# Patient Record
Sex: Female | Born: 1985
Health system: Southern US, Community
[De-identification: ages and names within clinical notes are randomized; demographics above are authoritative.]

## PROBLEM LIST (undated history)

## (undated) DIAGNOSIS — M9901 Segmental and somatic dysfunction of cervical region: Secondary | ICD-10-CM

## (undated) DIAGNOSIS — K219 Gastro-esophageal reflux disease without esophagitis: Secondary | ICD-10-CM

## (undated) DIAGNOSIS — M5136 Other intervertebral disc degeneration, lumbar region: Secondary | ICD-10-CM

## (undated) DIAGNOSIS — D649 Anemia, unspecified: Secondary | ICD-10-CM

## (undated) DIAGNOSIS — E785 Hyperlipidemia, unspecified: Secondary | ICD-10-CM

## (undated) DIAGNOSIS — D75839 Thrombocytosis, unspecified: Secondary | ICD-10-CM

## (undated) DIAGNOSIS — F329 Major depressive disorder, single episode, unspecified: Secondary | ICD-10-CM

## (undated) DIAGNOSIS — F419 Anxiety disorder, unspecified: Secondary | ICD-10-CM

## (undated) DIAGNOSIS — F909 Attention-deficit hyperactivity disorder, unspecified type: Secondary | ICD-10-CM

## (undated) DIAGNOSIS — F32A Depression, unspecified: Secondary | ICD-10-CM

## (undated) DIAGNOSIS — I10 Essential (primary) hypertension: Secondary | ICD-10-CM

## (undated) DIAGNOSIS — R519 Headache, unspecified: Secondary | ICD-10-CM

## (undated) DIAGNOSIS — G4730 Sleep apnea, unspecified: Secondary | ICD-10-CM

## (undated) DIAGNOSIS — M51369 Other intervertebral disc degeneration, lumbar region without mention of lumbar back pain or lower extremity pain: Secondary | ICD-10-CM

## (undated) DIAGNOSIS — E119 Type 2 diabetes mellitus without complications: Secondary | ICD-10-CM

## (undated) DIAGNOSIS — F319 Bipolar disorder, unspecified: Secondary | ICD-10-CM

## (undated) DIAGNOSIS — F603 Borderline personality disorder: Secondary | ICD-10-CM

## (undated) HISTORY — DX: Borderline personality disorder: F60.3

## (undated) HISTORY — DX: Depression, unspecified: F32.A

## (undated) HISTORY — DX: Attention-deficit hyperactivity disorder, unspecified type: F90.9

## (undated) HISTORY — DX: Essential (primary) hypertension: I10

## (undated) HISTORY — DX: Anxiety disorder, unspecified: F41.9

## (undated) HISTORY — PX: WISDOM TOOTH EXTRACTION: SHX21

## (undated) HISTORY — DX: Major depressive disorder, single episode, unspecified: F32.9

## (undated) HISTORY — DX: Bipolar disorder, unspecified: F31.9

---

## 2009-09-11 HISTORY — PX: DILATATION & CURRETTAGE/HYSTEROSCOPY WITH RESECTOCOPE: SHX5572

## 2010-04-18 ENCOUNTER — Ambulatory Visit: Payer: Self-pay

## 2010-04-21 ENCOUNTER — Ambulatory Visit: Payer: Self-pay

## 2014-09-11 DIAGNOSIS — F319 Bipolar disorder, unspecified: Secondary | ICD-10-CM

## 2014-09-11 HISTORY — DX: Bipolar disorder, unspecified: F31.9

## 2015-04-22 ENCOUNTER — Ambulatory Visit: Payer: Self-pay | Admitting: Psychiatry

## 2015-05-04 ENCOUNTER — Telehealth: Payer: Self-pay

## 2015-05-04 NOTE — Telephone Encounter (Signed)
Please refer to her Therapist.

## 2015-05-04 NOTE — Telephone Encounter (Signed)
pt wants a letter to get approved for a therapy dog.  pt states she needs a letter stating that it would help her or be a benfit for her to have a therapy dog.

## 2015-05-06 NOTE — Telephone Encounter (Signed)
pt has appt with nicole friday 05-07-15

## 2015-05-07 ENCOUNTER — Ambulatory Visit (INDEPENDENT_AMBULATORY_CARE_PROVIDER_SITE_OTHER): Payer: Self-pay | Admitting: Licensed Clinical Social Worker

## 2015-05-07 DIAGNOSIS — F3130 Bipolar disorder, current episode depressed, mild or moderate severity, unspecified: Secondary | ICD-10-CM
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
## 2015-09-12 DIAGNOSIS — F603 Borderline personality disorder: Secondary | ICD-10-CM

## 2015-09-12 HISTORY — DX: Borderline personality disorder: F60.3

## 2016-09-27 DIAGNOSIS — F603 Borderline personality disorder: Secondary | ICD-10-CM | POA: Insufficient documentation

## 2018-12-02 ENCOUNTER — Encounter: Payer: Self-pay | Admitting: Family Medicine

## 2018-12-02 ENCOUNTER — Telehealth: Payer: Self-pay | Admitting: Family Medicine

## 2018-12-02 ENCOUNTER — Other Ambulatory Visit: Payer: Self-pay

## 2018-12-02 ENCOUNTER — Ambulatory Visit (INDEPENDENT_AMBULATORY_CARE_PROVIDER_SITE_OTHER): Payer: BLUE CROSS/BLUE SHIELD | Admitting: Family Medicine

## 2018-12-02 VITALS — BP 148/84 | HR 100 | Temp 98.6°F | Resp 16 | Ht 62.0 in | Wt 262.9 lb

## 2018-12-02 DIAGNOSIS — Z13220 Encounter for screening for lipoid disorders: Secondary | ICD-10-CM

## 2018-12-02 DIAGNOSIS — Z833 Family history of diabetes mellitus: Secondary | ICD-10-CM

## 2018-12-02 DIAGNOSIS — Z23 Encounter for immunization: Secondary | ICD-10-CM

## 2018-12-02 DIAGNOSIS — Z114 Encounter for screening for human immunodeficiency virus [HIV]: Secondary | ICD-10-CM

## 2018-12-02 DIAGNOSIS — F319 Bipolar disorder, unspecified: Secondary | ICD-10-CM

## 2018-12-02 DIAGNOSIS — N921 Excessive and frequent menstruation with irregular cycle: Secondary | ICD-10-CM | POA: Diagnosis not present

## 2018-12-02 DIAGNOSIS — N946 Dysmenorrhea, unspecified: Secondary | ICD-10-CM

## 2018-12-02 DIAGNOSIS — Z1159 Encounter for screening for other viral diseases: Secondary | ICD-10-CM

## 2018-12-02 MED ORDER — QUETIAPINE FUMARATE 50 MG PO TABS: ORAL_TABLET | ORAL | 1 refills | Status: DC

## 2018-12-02 NOTE — Telephone Encounter (Signed)
Urgent referral to psychiatry for Bipolar Type I. Thanks!

## 2018-12-02 NOTE — Progress Notes (Signed)
Name: Amanda Nash   MRN: [id number]    DOB: [birth year]   Date:12/02/2018       Progress Note  Subjective  Chief Complaint  Chief Complaint  Patient presents with  . Establish Care  . Referral    GYN and Psychiatry    HPI  Bipolar I Disorder:  Has not had treatment in over 4 years. Was diagnosed at ago [age]yo.  As a teen she was on depakote 500mg /lamictal 25mg .  As a young adult she restarted medication, but can't remember what she was put on. Most recently she was on a medication that made her very drowsy and did not like the way it made her feel.  Over the last 4 years she has had many manic episodes. In a 6 month span she had 3-4 manic episodes that last for a few days.  Manic episodes include - restlessness, pressured/rapid speech, irritability/labile mood, hypersexuality.  She has never been hospitalized, has never lost a job due to her mental illness, and has never had a suicide attempt. Used to self harm, has not done so since 2012, though she admits to urges to do so.  No SI/HI today, no thoughts of self harm today.  In 2009 had an abortion due to non-viable fetus. After this she had a lot of uncontrolled/irregular vaginal bleeding.  Was seeing a provider at [facility] at the time, but has not been back since [facility].  She still has heavy and painful periods, large clots with menses, and dyspareunia.  She would like to see GYN to discuss partial hysterectomy as she is not seeking children and wants her bleeding and pain to stop.  Discussed likely course of testing prior to reaching conclusion of need for hysterectomy, she verbalizes understanding and we will refer today.  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).  She works from home. Tries to eat low sodium and low carb meals. She notes she is down 10lbs since 2016.  No family or personal history of pancreatitis or thyroid cancer.   There are no active problems to display for this patient.   Past Surgical History:  Procedure Laterality Date  . DILATATION & CURRETTAGE/HYSTEROSCOPY WITH RESECTOCOPE  2011    Family History  Problem Relation Age of Onset  . Depression Mother   . Anxiety disorder Mother   . Heart disease Mother   . Bipolar disorder Father   . Hypertension Father   . Diabetes Father   . Depression Brother   . Anxiety disorder Brother     Social History   Socioeconomic History  . Marital status: Married    Spouse name: Shela Nevin  . Number of children: Not on file  . Years of education: Not on file  . Highest education level: Not on file  Occupational History  . Occupation: Systems developer    Comment: [place]  . Financial resource strain: Not hard at all  . Food insecurity:    Worry: Never true    Inability: Never true  . Transportation needs:    Medical: No    Non-medical: No  Tobacco Use  . Smoking status: Former Smoker    Last attempt to quit: 01/31/2005    Years since quitting: 13.8  . Smokeless tobacco: Never Used  Substance and Sexual Activity  . Alcohol use: Yes    Comment: rarely  . Drug use: Yes    Types: Marijuana  . Sexual activity: Yes  Partners: Male    Birth control/protection: None  Lifestyle  . Physical activity:    Days per week: 0 days    Minutes per session: 0 min  . Stress: Rather much  Relationships  . Social connections:    Talks on phone: More than three times a week    Gets together: More than three times a week    Attends religious service: More than 4 times per year    Active member of club or organization: No    Attends meetings of clubs or organizations: Never    Relationship status: Married  . Intimate partner violence:    Fear of current or ex partner: No    Emotionally abused: No    Physically abused: No    Forced sexual activity: No  Other Topics Concern  . Not on file  Social History Narrative  . Not on file    No current outpatient medications on file.  No Known  Allergies  I personally reviewed active problem list, medication list, allergies, family history, social history, health maintenance, notes from last encounter, lab results with the patient/caregiver today.   ROS  Constitutional: Negative for fever or weight change.  Respiratory: Negative for cough and shortness of breath.   Cardiovascular: Negative for chest pain or palpitations.  Gastrointestinal: Negative for abdominal pain, no bowel changes.  Musculoskeletal: Negative for gait problem or joint swelling.  Skin: Negative for rash.  Neurological: Negative for dizziness or headache.  No other specific complaints in a complete review of systems (except as listed in HPI above).  Objective  Vitals:   12/02/18 1255  BP: (!) 148/84  Pulse: 100  Resp: 16  Temp: 98.6 F (37 C)  TempSrc: Oral  SpO2: 97%  Weight: 262 lb 14.4 oz (119.3 kg)  Height: 5\' 2"  (1.575 m)   Body mass index is 48.09 kg/m.  Physical Exam  Constitutional: Patient appears well-developed and well-nourished. No distress.  HENT: Head: Normocephalic and atraumatic. Ears: bilateral TMs with no erythema or effusion; Nose: Nose normal. Mouth/Throat: Oropharynx is clear and moist. No oropharyngeal exudate or tonsillar swelling.  Eyes: Conjunctivae and EOM are normal. No scleral icterus.  Pupils are equal, round, and reactive to light.  Neck: Normal range of motion. Neck supple. No JVD present. No thyromegaly present.  Cardiovascular: Normal rate, regular rhythm and normal heart sounds.  No murmur heard. No BLE edema. Pulmonary/Chest: Effort normal and breath sounds normal. No respiratory distress. Abdominal: Soft. Bowel sounds are normal, no distension. There is no tenderness. No masses. Musculoskeletal: Normal range of motion, no joint effusions. No gross deformities Neurological: Pt is alert and oriented to person, place, and time. No cranial nerve deficit. Coordination, balance, strength, speech and gait are normal.   Skin: Skin is warm and dry. No rash noted. No erythema.  Psychiatric: Patient has a normal mood and affect. behavior is normal. Judgment and thought content normal.  No results found for this or any previous visit (from the past 72 hour(s)).  PHQ2/9: Depression screen PHQ 2/9 12/02/2018  Decreased Interest 2  Down, Depressed, Hopeless 3  PHQ - 2 Score 5  Altered sleeping 3  Tired, decreased energy 3  Change in appetite 1  Feeling bad or failure about yourself  3  Trouble concentrating 1  Moving slowly or fidgety/restless 2  Suicidal thoughts 2  PHQ-9 Score 20  Difficult doing work/chores Extremely dIfficult   PHQ-2/9 Result is positive - referral to psychiatry placed.    Fall  Risk: Fall Risk  12/02/2018  Falls in the past year? 0  Number falls in past yr: 0  Injury with Fall? 0  Follow up Falls evaluation completed   Assessment & Plan  1. Bipolar I disorder [facility]) - Ambulatory referral to Psychiatry - Spoke with Dr. Nicolasa Ducking with psychiatry, she advises to start Seroquel while patient awaits new patient visit. Patient is aware of new start of Seroquel and will follow up with pharmacy to pick up meds.  2. Dysmenorrhea - Ambulatory referral to Gynecology  3. Menorrhagia with irregular cycle - Ambulatory referral to Gynecology - CBC w/Diff/Platelet - COMPLETE METABOLIC PANEL WITH GFR - TSH  4. Need for Tdap vaccination - Tdap vaccine greater than or equal to 7yo IM  5. Need for hepatitis C screening test - Hepatitis C antibody  6. Encounter for screening for HIV - HIV Antibody (routine testing w rflx)  7. Family history of diabetes mellitus - COMPLETE METABOLIC PANEL WITH GFR - Hemoglobin A1c  8. Lipid screening - Lipid panel  9. Morbid obesity ([place]) - CBC w/Diff/Platelet - COMPLETE METABOLIC PANEL WITH GFR - Hemoglobin A1c - Lipid panel - TSH  - Discussed importance of 150 minutes of physical activity weekly, eat two servings of fish weekly, eat one serving  of tree nuts ( cashews, pistachios, pecans, almonds.Marland Kitchen) every other day, eat 6 servings of fruit/vegetables daily and drink plenty of water and avoid sweet beverages.

## 2018-12-02 NOTE — Patient Instructions (Signed)
Here are some resources to help you if you feel you are in a mental health crisis:  National Suicide Prevention Lifeline - Call 1-800-273-8255  for help - Website with more resources: https://suicidepreventionlifeline.org/  [facility] Mobile Crisis Program - Call 336-538-1220 for help. - Mobile Crisis Program available 24 hours a day, 365 days a year. - Available for anyone of any age in [place] counties.  [facility] - Address: [address], [place] - Telephone: [phone]  - Hours of Operation: Sunday - Saturday - 8:00 a.m. - 8:00 p.m. - Medicaid, Medicare (Government Issued Only), BCBS, and Cash - Pay - Crisis Management, Outpatient Individual & Group Therapy, Psychiatrists on-site to provide medication management, In-Home Psychiatric Care, and Peer Support Care.  [facility] - Call 1-877-626-1772 for help. - Mobile Crisis Program available 24 hours a day, 365 days a year. - Available for anyone of any age in [place]    

## 2018-12-03 ENCOUNTER — Other Ambulatory Visit: Payer: Self-pay | Admitting: Family Medicine

## 2018-12-03 DIAGNOSIS — R7303 Prediabetes: Secondary | ICD-10-CM

## 2018-12-03 LAB — COMPLETE METABOLIC PANEL WITH GFR
AG Ratio: 1.4 (calc) (ref 1.0–2.5)
ALT: 24 U/L (ref 6–29)
AST: 14 U/L (ref 10–30)
Albumin: 4.1 g/dL (ref 3.6–5.1)
Alkaline phosphatase (APISO): 72 U/L (ref 31–125)
BUN: 13 mg/dL (ref 7–25)
CO2: 28 mmol/L (ref 20–32)
CREATININE: 0.79 mg/dL (ref 0.50–1.10)
Calcium: 9.5 mg/dL (ref 8.6–10.2)
Chloride: 103 mmol/L (ref 98–110)
GFR, EST NON AFRICAN AMERICAN: 98 mL/min/{1.73_m2} (ref >=60)
GFR, Est African American: 114 mL/min/{1.73_m2} (ref >=60)
GLOBULIN: 2.9 g/dL (ref 1.9–3.7)
Glucose, Bld: 109 mg/dL — ABNORMAL HIGH (ref 65–99)
Potassium: 4.5 mmol/L (ref 3.5–5.3)
Sodium: 138 mmol/L (ref 135–146)
Total Bilirubin: 0.2 mg/dL (ref 0.2–1.2)
Total Protein: 7 g/dL (ref 6.1–8.1)

## 2018-12-03 LAB — CBC WITH DIFFERENTIAL/PLATELET
ABSOLUTE MONOCYTES: 616 {cells}/uL (ref 200–950)
Basophils Absolute: 49 cells/uL (ref 0–200)
Basophils Relative: 0.7 %
EOS PCT: 1.1 %
Eosinophils Absolute: 77 cells/uL (ref 15–500)
HCT: 34.5 % — ABNORMAL LOW (ref 35.0–45.0)
HEMOGLOBIN: 10.5 g/dL — AB (ref 11.7–15.5)
Lymphs Abs: 2282 cells/uL (ref 850–3900)
MCH: 20.6 pg — ABNORMAL LOW (ref 27.0–33.0)
MCHC: 30.4 g/dL — ABNORMAL LOW (ref 32.0–36.0)
MCV: 67.6 fL — ABNORMAL LOW (ref 80.0–100.0)
MPV: 9.6 fL (ref 7.5–12.5)
Monocytes Relative: 8.8 %
Neutro Abs: 3976 cells/uL (ref 1500–7800)
Neutrophils Relative %: 56.8 %
Platelets: 454 10*3/uL — ABNORMAL HIGH (ref 140–400)
RBC: 5.1 10*6/uL (ref 3.80–5.10)
RDW: 16.9 % — ABNORMAL HIGH (ref 11.0–15.0)
TOTAL LYMPHOCYTE: 32.6 %
WBC: 7 10*3/uL (ref 3.8–10.8)

## 2018-12-03 LAB — HEPATITIS C ANTIBODY
Hepatitis C Ab: NONREACTIVE
SIGNAL TO CUT-OFF: 0.01 (ref <1.00)

## 2018-12-03 LAB — HEMOGLOBIN A1C
Hgb A1c MFr Bld: 6.1 % of total Hgb — ABNORMAL HIGH (ref <5.7)
MEAN PLASMA GLUCOSE: 128 (calc)
eAG (mmol/L): 7.1 (calc)

## 2018-12-03 LAB — HIV ANTIBODY (ROUTINE TESTING W REFLEX): HIV 1&2 Ab, 4th Generation: NONREACTIVE

## 2018-12-03 LAB — LIPID PANEL
Cholesterol: 152 mg/dL (ref <200)
HDL: 51 mg/dL (ref >=50)
LDL CHOLESTEROL (CALC): 66 mg/dL
NON-HDL CHOLESTEROL (CALC): 101 mg/dL (ref <130)
TRIGLYCERIDES: 269 mg/dL — AB (ref <150)
Total CHOL/HDL Ratio: 3 (calc) (ref <5.0)

## 2018-12-03 LAB — TSH: TSH: 0.72 mIU/L

## 2018-12-03 MED ORDER — INSULIN PEN NEEDLE 32G X 6 MM MISC: 1.0000 | 0 refills | Status: DC

## 2018-12-03 MED ORDER — SEMAGLUTIDE(0.25 OR 0.5MG/DOS) 2 MG/1.5ML ~~LOC~~ SOPN: PEN_INJECTOR | SUBCUTANEOUS | 1 refills | Status: AC

## 2018-12-16 ENCOUNTER — Encounter: Payer: Self-pay | Admitting: Obstetrics and Gynecology

## 2018-12-17 ENCOUNTER — Encounter: Payer: Self-pay | Admitting: Obstetrics and Gynecology

## 2018-12-17 ENCOUNTER — Other Ambulatory Visit: Payer: Self-pay

## 2018-12-17 ENCOUNTER — Ambulatory Visit (INDEPENDENT_AMBULATORY_CARE_PROVIDER_SITE_OTHER): Payer: BLUE CROSS/BLUE SHIELD | Admitting: Obstetrics and Gynecology

## 2018-12-17 ENCOUNTER — Other Ambulatory Visit (HOSPITAL_COMMUNITY)
Admission: RE | Admit: 2018-12-17 | Discharge: 2018-12-17 | Disposition: A | Discharge status: Home / Self Care | Payer: BLUE CROSS/BLUE SHIELD | Source: Ambulatory Visit | Attending: Obstetrics and Gynecology | Admitting: Obstetrics and Gynecology

## 2018-12-17 VITALS — BP 130/90 | Ht 62.0 in | Wt 266.0 lb

## 2018-12-17 DIAGNOSIS — N921 Excessive and frequent menstruation with irregular cycle: Secondary | ICD-10-CM | POA: Insufficient documentation

## 2018-12-17 DIAGNOSIS — N946 Dysmenorrhea, unspecified: Secondary | ICD-10-CM | POA: Diagnosis not present

## 2018-12-17 NOTE — Progress Notes (Signed)
Obstetrics & Gynecology Office Visit   Chief Complaint  Patient presents with  . Menorrhagia  . Dysmenorrhea  Referral of Raelyn Ensign, FNP, from [facility] for menorrhagia and dysmenorrhea   History of Present Illness: 33 y.o. G1P0010 who presents in referral of Raelyn Ensign, [place], from [facility] for menorrhagia and dysmenorrhea.  She notes very heavy, irregular menses.  She is sure she has cysts on her ovaries.  She has considered a hysterectomy.  But mostly she wants the issues to stop.  She notes heavy periods that have been present always.  They last about 7 days.  Her record is 4 months straight where she had a period nearly every day. Her periods last 7-10 days. She passes clots that are sometimes "as big as clementines."   She feels extreme pain once a month where she feels this one side, then two or three days later it is on the other side. This is sometimes accompanied by a light period that may be spotting that lasts a couple of days.  She was on the combined OCP when she got pregnant in 2009.   The D&C was not accomplished until 1-2 years after the miscarriage. This was due to her periods being irregular. She does not remember the last time she had a pap smear. She does not believe she has ever had an abnormal one.  She has a history of STDs in 2014, she had chlamydia that was treated.  Apart from the pills she has been on no medication to regular her menses.   Past Medical History:  Diagnosis Date  . Anxiety   . Bipolar 1 disorder ([place]) 2016  . Borderline personality disorder ([place]) 2017  . Depression     Past Surgical History:  Procedure Laterality Date  . DILATATION & CURRETTAGE/HYSTEROSCOPY WITH RESECTOCOPE  2011    Gynecologic History: Patient's last menstrual period was 11/19/2018.  Obstetric History: G1P0010  Family History  Problem Relation Age of Onset  . Depression Mother   . Anxiety disorder Mother   . Heart disease Mother   .  Bipolar disorder Father   . Hypertension Father   . Diabetes Father   . Depression Brother   . Anxiety disorder Brother     Social History   Socioeconomic History  . Marital status: Married    Spouse name: Shela Nevin  . Number of children: Not on file  . Years of education: Not on file  . Highest education level: Not on file  Occupational History  . Occupation: Systems developer    Comment: [place]  . Financial resource strain: Not hard at all  . Food insecurity:    Worry: Never true    Inability: Never true  . Transportation needs:    Medical: No    Non-medical: No  Tobacco Use  . Smoking status: Former Smoker    Last attempt to quit: 01/31/2005    Years since quitting: 13.8  . Smokeless tobacco: Never Used  Substance and Sexual Activity  . Alcohol use: Yes    Comment: rarely  . Drug use: Yes    Types: Marijuana  . Sexual activity: Yes    Partners: Male    Birth control/protection: None  Lifestyle  . Physical activity:    Days per week: 0 days    Minutes per session: 0 min  . Stress: Rather much  Relationships  . Social connections:    Talks on phone: More than three times a  week    Gets together: More than three times a week    Attends religious service: More than 4 times per year    Active member of club or organization: No    Attends meetings of clubs or organizations: Never    Relationship status: Married  . Intimate partner violence:    Fear of current or ex partner: No    Emotionally abused: No    Physically abused: No    Forced sexual activity: No  Other Topics Concern  . Not on file  Social History Narrative   Live with husband and her brother, has 2 dogs and a bunny at home. Has stepdaughter that does not live with them.   Allergies: No Known Allergies  Prior to Admission medications   Medication Sig Start Date End Date Taking? Authorizing Provider  QUEtiapine (SEROQUEL) 50 MG tablet Take 1 tablet every night x7 days, then  increase to 2 tablets every night. 12/02/18  Yes Hubbard Hartshorn, FNP  Insulin Pen Needle (NOVOFINE) 32G X 6 MM MISC 1 each by Does not apply route once a week. Patient not taking: Reported on 12/17/2018 12/03/18   Hubbard Hartshorn, FNP  Semaglutide,0.25 or 0.5MG /DOS, (OZEMPIC, 0.25 OR 0.5 MG/DOSE,) 2 MG/1.5ML SOPN Inject 0.25 mg into the skin once a week for 28 days, THEN 0.5 mg once a week. Patient not taking: Reported on 12/17/2018 12/03/18 03/31/19  Hubbard Hartshorn, FNP    Review of Systems  Constitutional: Negative.   HENT: Negative.   Eyes: Negative.   Respiratory: Negative.   Cardiovascular: Negative.   Gastrointestinal: Negative.   Genitourinary: Negative.   Musculoskeletal: Negative.   Skin: Negative.   Neurological: Negative.   Psychiatric/Behavioral: Negative.      Physical Exam BP 130/90   Ht 5\' 2"  (1.575 m)   Wt 266 lb (120.7 kg)   LMP 11/19/2018   BMI 48.65 kg/m  Patient's last menstrual period was 11/19/2018. Physical Exam Constitutional:      General: She is not in acute distress.    Appearance: Normal appearance. She is well-developed.  Genitourinary:     Pelvic exam was performed with patient in the lithotomy position.     Vulva, inguinal canal, urethra, bladder, vagina, uterus, right adnexa and left adnexa normal.     No posterior fourchette tenderness, injury or lesion present.     No cervical friability, lesion, bleeding or polyp.     Genitourinary Comments: Exam limited by body habitus  HENT:     Head: Normocephalic and atraumatic.  Eyes:     General: No scleral icterus.    Conjunctiva/sclera: Conjunctivae normal.  Neck:     Musculoskeletal: Normal range of motion and neck supple.  Cardiovascular:     Rate and Rhythm: Normal rate and regular rhythm.     Heart sounds: No murmur. No friction rub. No gallop.   Pulmonary:     Effort: Pulmonary effort is normal. No respiratory distress.     Breath sounds: Normal breath sounds. No wheezing or rales.  Abdominal:      General: Bowel sounds are normal. There is no distension.     Palpations: Abdomen is soft. There is no mass.     Tenderness: There is no abdominal tenderness. There is no guarding or rebound.  Musculoskeletal: Normal range of motion.  Neurological:     General: No focal deficit present.     Mental Status: She is alert and oriented to person, place, and time.  Cranial Nerves: No cranial nerve deficit.  Skin:    General: Skin is warm and dry.     Findings: No erythema.  Psychiatric:        Mood and Affect: Mood normal.        Behavior: Behavior normal.        Judgment: Judgment normal.     Female chaperone present for pelvic and breast  portions of the physical exam  Assessment: 33 y.o. No obstetric history on file. female here for  1. Menorrhagia with irregular cycle   2. Dysmenorrhea      Plan: Problem List Items Addressed This Visit      Genitourinary   Dysmenorrhea   Relevant Orders   Cytology - PAP   US PELVIS TRANSVANGINAL NON-OB (TV ONLY)     Other   Menorrhagia with irregular cycle - Primary   Relevant Orders   Cytology - PAP   US PELVIS TRANSVANGINAL NON-OB (TV ONLY)     Discussed management options for abnormal uterine bleeding including expectant, NSAIDs, tranexamic acid (Lysteda), oral progesterone (Provera, norethindrone, megace), Depo Provera, Levonorgestrel containing IUD, endometrial ablation (Novasure) or hysterectomy as definitive surgical management.  Discussed risks and benefits of each method.   Final management decision will hinge on results of patient's work up and whether an underlying etiology for the patients bleeding symptoms can be discerned.  We will conduct a basic work up examining using the PALM-COIEN classification system.  In the meantime the patient opts to trial no medication while we await results of her ultrasound and labs.  Printed patient education handouts were given to the patient to review at home.  Bleeding precautions  reviewed.   30 minutes spent in face to face discussion with > 50% spent in counseling,management, and coordination of care of her menorrhagia with irregular cycle, dysmenorrhea.   Prentice Docker, MD 12/17/2018 3:22 PM     CC:  Hubbard Hartshorn, [address] [place], [place] [id number]

## 2018-12-20 ENCOUNTER — Encounter: Payer: Self-pay | Admitting: Family Medicine

## 2018-12-20 ENCOUNTER — Telehealth: Payer: Self-pay | Admitting: Family Medicine

## 2018-12-20 NOTE — Telephone Encounter (Signed)
Copied from [place] [phone]. Topic: Quick Communication - See Telephone Encounter >> Dec 20, 2018  3:02 PM Blase Mess A wrote: CRM for notification. See Telephone encounter for: 12/20/18.  Amanda Nash calling from [facility] calling to report that theSemaglutide,0.25 or 0.5MG /DOS, (OZEMPIC, 0.25 OR 0.5 MG/DOSE,) 2 MG/1.5ML SOPN [[id number]] was denied for the reason that no information was provided if metformin, hb 1c was provided. Please advise CB- [phone] Reference [id number]

## 2018-12-24 ENCOUNTER — Other Ambulatory Visit: Payer: Self-pay

## 2018-12-24 ENCOUNTER — Ambulatory Visit (INDEPENDENT_AMBULATORY_CARE_PROVIDER_SITE_OTHER): Payer: Self-pay | Admitting: Family Medicine

## 2018-12-24 ENCOUNTER — Encounter: Payer: Self-pay | Admitting: Family Medicine

## 2018-12-24 DIAGNOSIS — R7303 Prediabetes: Secondary | ICD-10-CM | POA: Insufficient documentation

## 2018-12-24 DIAGNOSIS — F319 Bipolar disorder, unspecified: Secondary | ICD-10-CM

## 2018-12-24 DIAGNOSIS — N921 Excessive and frequent menstruation with irregular cycle: Secondary | ICD-10-CM

## 2018-12-24 DIAGNOSIS — D649 Anemia, unspecified: Secondary | ICD-10-CM | POA: Insufficient documentation

## 2018-12-24 DIAGNOSIS — D75839 Thrombocytosis, unspecified: Secondary | ICD-10-CM | POA: Insufficient documentation

## 2018-12-24 DIAGNOSIS — D473 Essential (hemorrhagic) thrombocythemia: Secondary | ICD-10-CM

## 2018-12-24 LAB — CYTOLOGY - PAP
Chlamydia: NEGATIVE
Diagnosis: NEGATIVE
HPV: NOT DETECTED
Neisseria Gonorrhea: NEGATIVE

## 2018-12-24 MED ORDER — QUETIAPINE FUMARATE 100 MG PO TABS: 100.0000 mg | ORAL_TABLET | Freq: Every day | ORAL | 0 refills | Status: DC

## 2018-12-24 NOTE — Progress Notes (Signed)
Name: Amanda Nash   MRN: [id number]    DOB: May 11, 1986   Date:12/24/2018       Progress Note  Subjective  Chief Complaint  Chief Complaint  Patient presents with   Follow-up    I connected with  [name]  on 12/24/18 at  9:20 AM EDT by a video enabled telemedicine application and verified that I am speaking with the correct person using two identifiers.  I discussed the limitations of evaluation and management by telemedicine and the availability of in person appointments. The patient expressed understanding and agreed to proceed. Staff also discussed with the patient that there may be a patient responsible charge related to this service. Patient Location: Home Provider Location: Home Additional Individuals present: None  HPI  Anemia/thrombocytosis: Seeing Dr. Glennon Mac - had first appt 12/17/2018 and has transvaginal US ordered for tomorrow.  She has menorrhagia, and did discuss several options for control of bleeding, and they are proceeding with a routine work-up first.  She is considering hysterectomy as an option.  She denies fatigue, but does endorse some easier bruising. Denies blood in stool, dark and tarry stools, blood in urine, or epistaxis; occasional gum bleeding.  Bipolar I Disorder:  Had not had treatment in over 4 years. Was diagnosed at ago [age]yo.  As a teen she was on depakote 500mg /lamictal 25mg .  As a young adult she restarted medication, but can't remember what she was put on. Most recently she was on a medication that made her very drowsy and did not like the way it made her feel.  Over the last 4 years she has had many manic episodes. In a 6 month span she had 3-4 manic episodes that last for a few days.  Manic episodes include - restlessness, pressured/rapid speech, irritability/labile mood, hypersexuality.  She has never been hospitalized, has never lost a job due to her mental illness, and has never had a suicide attempt. Used to self harm, has not done so  since 2012, though she admits to urges to do so.  No SI/HI today, no thoughts of self harm today.  She is taking seroquel 100mg  (rx'd at her initial visit) and is feeling good at this dose for her rest, but her depression symptoms are still quite elevated.  She has an appointment to do her paperwork tomorrow, and then will do a virtual visit later on.  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).  She works from home. Tries to eat low sodium and low carb meals. She notes she is down 10lbs since 2016.  No family or personal history of pancreatitis or thyroid cancer. She is not walking more than to take the dogs outside.  Prediabetes: Waiting on Ozempic PA; denies polydipsia, polyuria, or polyphagia.  Her husband is diabetic, and she is already using splenda, avoiding carbs as much as possible.    Patient Active Problem List   Diagnosis Date Noted   Bipolar I disorder ([place]) 12/02/2018   Dysmenorrhea 12/02/2018   Menorrhagia with irregular cycle 12/02/2018   Morbid obesity ([place]) 12/02/2018    Past Surgical History:  Procedure Laterality Date   DILATATION & CURRETTAGE/HYSTEROSCOPY WITH RESECTOCOPE  2011    Family History  Problem Relation Age of Onset   Depression Mother    Anxiety disorder Mother    Heart disease Mother    Bipolar disorder Father    Hypertension Father    Diabetes Father    Depression Brother  Anxiety disorder Brother     Social History   Socioeconomic History   Marital status: Married    Spouse name: Brian0   Number of children: Not on file   Years of education: Not on file   Highest education level: Not on file  Occupational History   Occupation: Systems developer    Comment: [employer]  Social Needs   Financial resource strain: Not hard at all   Food insecurity:    Worry: Never true    Inability: Never true   Transportation needs:    Medical: No    Non-medical: No  Tobacco Use   Smoking  status: Former Smoker    Last attempt to quit: 01/31/2005    Years since quitting: 13.9   Smokeless tobacco: Never Used  Substance and Sexual Activity   Alcohol use: Yes    Comment: rarely   Drug use: Yes    Types: Marijuana   Sexual activity: Yes    Partners: Male    Birth control/protection: None  Lifestyle   Physical activity:    Days per week: 0 days    Minutes per session: 0 min   Stress: Rather much  Relationships   Social connections:    Talks on phone: More than three times a week    Gets together: More than three times a week    Attends religious service: More than 4 times per year    Active member of club or organization: No    Attends meetings of clubs or organizations: Never    Relationship status: Married   Intimate partner violence:    Fear of current or ex partner: No    Emotionally abused: No    Physically abused: No    Forced sexual activity: No  Other Topics Concern   Not on file  Social History Narrative   Live with husband and her brother, has 2 dogs and a bunny at home. Has stepdaughter that does not live with them.     Current Outpatient Medications:    QUEtiapine (SEROQUEL) 50 MG tablet, Take 1 tablet every night x7 days, then increase to 2 tablets every night., Disp: 60 tablet, Rfl: 1   Insulin Pen Needle (NOVOFINE) 32G X 6 MM MISC, 1 each by Does not apply route once a week. (Patient not taking: Reported on 12/17/2018), Disp: 100 each, Rfl: 0   Semaglutide,0.25 or 0.5MG /DOS, (OZEMPIC, 0.25 OR 0.5 MG/DOSE,) 2 MG/1.5ML SOPN, Inject 0.25 mg into the skin once a week for 28 days, THEN 0.5 mg once a week. (Patient not taking: Reported on 12/17/2018), Disp: 3 pen, Rfl: 1  Allergies  Allergen Reactions   Shellfish Allergy Hives, Itching, Other (See Comments), Rash and Swelling    I personally reviewed active problem list, medication list, allergies, health maintenance, notes from last encounter, lab results with the patient/caregiver  today.   ROS Constitutional: Negative for fever or weight change.  Respiratory: Negative for cough and shortness of breath.   Cardiovascular: Negative for chest pain or palpitations.  Gastrointestinal: Negative for abdominal pain, no bowel changes.  Musculoskeletal: Negative for gait problem or joint swelling.  Skin: Negative for rash.  Neurological: Negative for dizziness or headache.  No other specific complaints in a complete review of systems (except as listed in HPI above).  Objective  Virtual encounter, vitals not obtained.  There is no height or weight on file to calculate BMI.  Physical Exam  Constitutional: Patient appears well-developed and well-nourished. No distress.  HENT: Head:  Normocephalic and atraumatic.  Neck: Normal range of motion. Pulmonary/Chest: Effort normal. No respiratory distress. Speaking in complete sentences Neurological: Pt is alert and oriented to person, place, and time. Coordination, speech are normal.  Psychiatric: Patient has a normal mood and affect. behavior is normal. Judgment and thought content normal.  No results found for this or any previous visit (from the past 72 hour(s)).  PHQ2/9: Depression screen [facility] 2/9 12/24/2018 12/02/2018  Decreased Interest 2 2  Down, Depressed, Hopeless 3 3  PHQ - 2 Score 5 5  Altered sleeping 3 3  Tired, decreased energy 3 3  Change in appetite 0 1  Feeling bad or failure about yourself  3 3  Trouble concentrating 0 1  Moving slowly or fidgety/restless 2 2  Suicidal thoughts 1 2  PHQ-9 Score 17 20  Difficult doing work/chores Very difficult Extremely dIfficult   PHQ-2/9 Result is positive.    Fall Risk: Fall Risk  12/24/2018 12/02/2018  Falls in the past year? 0 0  Number falls in past yr: 0 0  Injury with Fall? 0 0  Follow up Falls evaluation completed Falls evaluation completed    Assessment & Plan  1. Bipolar I disorder ([place]) - Starting with Dr. Nicolasa Ducking tomorrow, will provide short refil of  seroquel to bridge her until her telehealth visit. - QUEtiapine (SEROQUEL) 100 MG tablet; Take 1 tablet (100 mg total) by mouth at bedtime.  Dispense: 30 tablet; Refill: 0  2. Prediabetes - Awaiting Ozempic PA from insurance.  Discussed diabetic diet in detail.  3. Menorrhagia with irregular cycle - Has pelvic US tomorrow with Dr. Glennon Mac - CBC w/Diff/Platelet - Iron, TIBC and Ferritin Panel  4. Thrombocytosis ([place]) - Suspect related to menorrhagia, will have her come in tomorrow for labs to trend, otherwise, will allow Dr. Glennon Mac to perform work up from [place] prior to sending to hematology for evaluation. - CBC w/Diff/Platelet  5. Anemia, unspecified type - Suspect related to menorrhagia, will have her come in tomorrow for labs to trend, otherwise, will allow Dr. Glennon Mac to perform work up from [place] prior to sending to hematology for evaluation. - Iron, TIBC and Ferritin Panel  6. Morbid obesity ([place]) Discussed importance of 150 minutes of physical activity weekly, eat two servings of fish weekly, eat one serving of tree nuts ( cashews, pistachios, pecans, almonds.Marland Kitchen) every other day, eat 6 servings of fruit/vegetables daily and drink plenty of water and avoid sweet beverages.    I discussed the assessment and treatment plan with the patient. The patient was provided an opportunity to ask questions and all were answered. The patient agreed with the plan and demonstrated an understanding of the instructions.  The patient was advised to call back or seek an in-person evaluation if the symptoms worsen or if the condition fails to improve as anticipated.  I provided 26 minutes of non-face-to-face time during this encounter.

## 2018-12-24 NOTE — Patient Instructions (Signed)
Diabetes Mellitus and Nutrition, Adult  When you have diabetes (diabetes mellitus), it is very important to have healthy eating habits because your blood sugar (glucose) levels are greatly affected by what you eat and drink. Eating healthy foods in the appropriate amounts, at about the same times every day, can help you:  · Control your blood glucose.  · Lower your risk of heart disease.  · Improve your blood pressure.  · Reach or maintain a healthy weight.  Every person with diabetes is different, and each person has different needs for a meal plan. Your health care provider may recommend that you work with a diet and nutrition specialist (dietitian) to make a meal plan that is best for you. Your meal plan may vary depending on factors such as:  · The calories you need.  · The medicines you take.  · Your weight.  · Your blood glucose, blood pressure, and cholesterol levels.  · Your activity level.  · Other health conditions you have, such as heart or kidney disease.  How do carbohydrates affect me?  Carbohydrates, also called carbs, affect your blood glucose level more than any other type of food. Eating carbs naturally raises the amount of glucose in your blood. Carb counting is a method for keeping track of how many carbs you eat. Counting carbs is important to keep your blood glucose at a healthy level, especially if you use insulin or take certain oral diabetes medicines.  It is important to know how many carbs you can safely have in each meal. This is different for every person. Your dietitian can help you calculate how many carbs you should have at each meal and for each snack.  Foods that contain carbs include:  · Bread, cereal, rice, pasta, and crackers.  · Potatoes and corn.  · Peas, beans, and lentils.  · Milk and yogurt.  · Fruit and juice.  · Desserts, such as cakes, cookies, ice cream, and candy.  How does alcohol affect me?  Alcohol can cause a sudden decrease in blood glucose (hypoglycemia),  especially if you use insulin or take certain oral diabetes medicines. Hypoglycemia can be a life-threatening condition. Symptoms of hypoglycemia (sleepiness, dizziness, and confusion) are similar to symptoms of having too much alcohol.  If your health care provider says that alcohol is safe for you, follow these guidelines:  · Limit alcohol intake to no more than 1 drink per day for nonpregnant women and 2 drinks per day for men. One drink equals 12 oz of beer, 5 oz of wine, or 1½ oz of hard liquor.  · Do not drink on an empty stomach.  · Keep yourself hydrated with water, diet soda, or unsweetened iced tea.  · Keep in mind that regular soda, juice, and other mixers may contain a lot of sugar and must be counted as carbs.  What are tips for following this plan?    Reading food labels  · Start by checking the serving size on the "Nutrition Facts" label of packaged foods and drinks. The amount of calories, carbs, fats, and other nutrients listed on the label is based on one serving of the item. Many items contain more than one serving per package.  · Check the total grams (g) of carbs in one serving. You can calculate the number of servings of carbs in one serving by dividing the total carbs by 15. For example, if a food has 30 g of total carbs, it would be equal to 2   servings of carbs.  · Check the number of grams (g) of saturated and trans fats in one serving. Choose foods that have low or no amount of these fats.  · Check the number of milligrams (mg) of salt (sodium) in one serving. Most people should limit total sodium intake to less than 2,300 mg per day.  · Always check the nutrition information of foods labeled as "low-fat" or "nonfat". These foods may be higher in added sugar or refined carbs and should be avoided.  · Talk to your dietitian to identify your daily goals for nutrients listed on the label.  Shopping  · Avoid buying canned, premade, or processed foods. These foods tend to be high in fat, sodium,  and added sugar.  · Shop around the outside edge of the grocery store. This includes fresh fruits and vegetables, bulk grains, fresh meats, and fresh dairy.  Cooking  · Use low-heat cooking methods, such as baking, instead of high-heat cooking methods like deep frying.  · Cook using healthy oils, such as olive, canola, or sunflower oil.  · Avoid cooking with butter, cream, or high-fat meats.  Meal planning  · Eat meals and snacks regularly, preferably at the same times every day. Avoid going long periods of time without eating.  · Eat foods high in fiber, such as fresh fruits, vegetables, beans, and whole grains. Talk to your dietitian about how many servings of carbs you can eat at each meal.  · Eat 4-6 ounces (oz) of lean protein each day, such as lean meat, chicken, fish, eggs, or tofu. One oz of lean protein is equal to:  ? 1 oz of meat, chicken, or fish.  ? 1 egg.  ? ¼ cup of tofu.  · Eat some foods each day that contain healthy fats, such as avocado, nuts, seeds, and fish.  Lifestyle  · Check your blood glucose regularly.  · Exercise regularly as told by your health care provider. This may include:  ? 150 minutes of moderate-intensity or vigorous-intensity exercise each week. This could be brisk walking, biking, or water aerobics.  ? Stretching and doing strength exercises, such as yoga or weightlifting, at least 2 times a week.  · Take medicines as told by your health care provider.  · Do not use any products that contain nicotine or tobacco, such as cigarettes and e-cigarettes. If you need help quitting, ask your health care provider.  · Work with a counselor or diabetes educator to identify strategies to manage stress and any emotional and social challenges.  Questions to ask a health care provider  · Do I need to meet with a diabetes educator?  · Do I need to meet with a dietitian?  · What number can I call if I have questions?  · When are the best times to check my blood glucose?  Where to find more  information:  · American Diabetes Association: diabetes.org  · Academy of Nutrition and Dietetics: www.eatright.org  · National Institute of Diabetes and Digestive and Kidney Diseases (NIH): www.niddk.nih.gov  Summary  · A healthy meal plan will help you control your blood glucose and maintain a healthy lifestyle.  · Working with a diet and nutrition specialist (dietitian) can help you make a meal plan that is best for you.  · Keep in mind that carbohydrates (carbs) and alcohol have immediate effects on your blood glucose levels. It is important to count carbs and to use alcohol carefully.  This information is not intended to   replace advice given to you by your health care provider. Make sure you discuss any questions you have with your health care provider.  Document Released: 05/25/2005 Document Revised: 03/28/2017 Document Reviewed: 10/02/2016  Elsevier Interactive Patient Education © 2019 Elsevier Inc.

## 2018-12-24 NOTE — Telephone Encounter (Signed)
Paperwork sent to insurance

## 2018-12-25 ENCOUNTER — Ambulatory Visit (INDEPENDENT_AMBULATORY_CARE_PROVIDER_SITE_OTHER): Payer: BLUE CROSS/BLUE SHIELD | Admitting: Obstetrics and Gynecology

## 2018-12-25 ENCOUNTER — Encounter: Payer: Self-pay | Admitting: Obstetrics and Gynecology

## 2018-12-25 ENCOUNTER — Ambulatory Visit (INDEPENDENT_AMBULATORY_CARE_PROVIDER_SITE_OTHER): Payer: BLUE CROSS/BLUE SHIELD

## 2018-12-25 ENCOUNTER — Other Ambulatory Visit: Payer: Self-pay

## 2018-12-25 VITALS — BP 128/84 | Wt 266.0 lb

## 2018-12-25 DIAGNOSIS — D500 Iron deficiency anemia secondary to blood loss (chronic): Secondary | ICD-10-CM

## 2018-12-25 DIAGNOSIS — N946 Dysmenorrhea, unspecified: Secondary | ICD-10-CM

## 2018-12-25 DIAGNOSIS — N83201 Unspecified ovarian cyst, right side: Secondary | ICD-10-CM | POA: Diagnosis not present

## 2018-12-25 DIAGNOSIS — N921 Excessive and frequent menstruation with irregular cycle: Secondary | ICD-10-CM

## 2018-12-25 NOTE — Progress Notes (Signed)
Gynecology Ultrasound Follow Up   Chief Complaint  Patient presents with  . Follow-up    Menorrhagia, dysmenorrhea, and ultrasuond to evaluate these   History of Present Illness: Patient is a 33 y.o. female who presents today for ultrasound evaluation of the above .  Ultrasound demonstrates the following findings Adnexa:  Right ovary with simple-appearing cyst, measuring 2.5 x 2.6 x 2.6 cm. Otherwise, normal in appearing. Left ovary appears normal. Uterus: anteverted with endometrial stripe  11.4 mm Additional: 42mm nabothian cyst.  Normal pap smear and STD screen.   She has started her menses today.   Past Medical History:  Diagnosis Date  . Anxiety   . Bipolar 1 disorder ([place]) 2016  . Borderline personality disorder ([place]) 2017  . Depression     Past Surgical History:  Procedure Laterality Date  . DILATATION & CURRETTAGE/HYSTEROSCOPY WITH RESECTOCOPE  2011    Family History  Problem Relation Age of Onset  . Depression Mother   . Anxiety disorder Mother   . Heart disease Mother   . Bipolar disorder Father   . Hypertension Father   . Diabetes Father   . Depression Brother   . Anxiety disorder Brother     Social History   Socioeconomic History  . Marital status: Married    Spouse name: Shela Nevin  . Number of children: Not on file  . Years of education: Not on file  . Highest education level: Not on file  Occupational History  . Occupation: Systems developer    Comment: [employer]  . Financial resource strain: Not hard at all  . Food insecurity:    Worry: Never true    Inability: Never true  . Transportation needs:    Medical: No    Non-medical: No  Tobacco Use  . Smoking status: Former Smoker    Last attempt to quit: 01/31/2005    Years since quitting: 13.9  . Smokeless tobacco: Never Used  Substance and Sexual Activity  . Alcohol use: Yes    Comment: rarely  . Drug use: Yes    Types: Marijuana  . Sexual activity: Yes    Partners:  Male    Birth control/protection: None  Lifestyle  . Physical activity:    Days per week: 0 days    Minutes per session: 0 min  . Stress: Rather much  Relationships  . Social connections:    Talks on phone: More than three times a week    Gets together: More than three times a week    Attends religious service: More than 4 times per year    Active member of club or organization: No    Attends meetings of clubs or organizations: Never    Relationship status: Married  . Intimate partner violence:    Fear of current or ex partner: No    Emotionally abused: No    Physically abused: No    Forced sexual activity: No  Other Topics Concern  . Not on file  Social History Narrative   Live with husband and her brother, has 2 dogs and a bunny at home. Has stepdaughter that does not live with them.    Allergies  Allergen Reactions  . Shellfish Allergy Hives, Itching, Other (See Comments), Rash and Swelling    Prior to Admission medications   Medication Sig Start Date End Date Taking? Authorizing Provider  Insulin Pen Needle (NOVOFINE) 32G X 6 MM MISC 1 each by Does not apply route once a week.  Patient not taking: Reported on 12/17/2018 12/03/18   Hubbard Hartshorn, FNP  QUEtiapine (SEROQUEL) 100 MG tablet Take 1 tablet (100 mg total) by mouth at bedtime. 12/24/18   Hubbard Hartshorn, FNP  Semaglutide,0.25 or 0.5MG /DOS, (OZEMPIC, 0.25 OR 0.5 MG/DOSE,) 2 MG/1.5ML SOPN Inject 0.25 mg into the skin once a week for 28 days, THEN 0.5 mg once a week. Patient not taking: Reported on 12/17/2018 12/03/18 03/31/19  Hubbard Hartshorn, FNP    Physical Exam BP 128/84   Wt 266 lb (120.7 kg)   LMP 11/27/2018   BMI 48.65 kg/m    General: NAD HEENT: normocephalic, anicteric Pulmonary: No increased work of breathing Extremities: no edema, erythema, or tenderness Neurologic: Grossly intact, normal gait Psychiatric: mood appropriate, affect full  Imaging Results US Pelvis Transvanginal Non-ob (tv Only)  Result  Date: 12/25/2018 Patient Name: GREGG HOLSTER DOB: 01/08/1986 MRN: [id number] ULTRASOUND REPORT Location: [facility] Date of Service: 12/25/2018 Indications:Pelvic Pain Findings: The uterus is anteverted and measures 9.09 x 6.01 x 4.94 cm. Echo texture is homogenous without evidence of focal masses. The Endometrium measures 11.44 mm.  Slightly heterogenous. Right Ovary measures 4.85 x 3.27 x 3.53 cm. There is a simple-appearing hypoechoic lesion with good through transmission measuring 2.49 x 2.64 x 2.63 cm.  There is a negative doppler study, as well. Left Ovary measures 3.71 x 2.97 x 3.03 cm. It is normal in appearance. Survey of the adnexa demonstrates no adnexal masses. There is no free fluid in the cul de sac. Impression: 1. Slightly heterogenous endometrium. 2. Simple-appearing right Ovarian cyst. 3. Nabothian cyst 9 mm. Lillia Dallas, RDMS The ultrasound images and findings were reviewed by me and I agree with the above report. Prentice Docker, MD, [facility], [facility] 12/25/2018 11:05 AM        Assessment: 33 y.o. No obstetric history on file.  1. Dysmenorrhea   2. Menorrhagia with irregular cycle   3. Iron deficiency anemia due to chronic blood loss      Plan: Problem List Items Addressed This Visit      Genitourinary   Dysmenorrhea - Primary   Relevant Orders   Von Willebrand panel     Other   Menorrhagia with irregular cycle   Relevant Orders   Von Willebrand panel   Anemia   Relevant Orders   Von Willebrand panel     Discussed ultrasound findings.  No concerning findings.  There is a simple appearing right ovarian cyst.  This will likely resolve and is likely not the cause of her pain.  Discussed treatment options including clinical observation (do nothing at this time), treatment with medication, such as an intrauterine device.  The third option was surgical, which is not an option available to [place] at this time.  The patient is really interested  in having her symptoms resolve regardless of the therapeutic modality used to achieve that.  Discussed recommendation for endometrial biopsy.  At this time, she elects a Mirena IUD as initial treatment.  We will schedule her for biopsy and IUD placement next week since she just started her menses today.  20 minutes spent in face to face discussion with > 50% spent in counseling,management, and coordination of care of her menorrhagia, dysmenorrhea, and iron deficiency anemia due to chronic blood loss.  Return in about 1 week (around 01/01/2019) for Follow up for endometrial biopsy/Mirena IUD placement.   Prentice Docker, MD, [facility], [place]  12/25/2018 12:02 PM

## 2018-12-26 ENCOUNTER — Telehealth: Payer: Self-pay | Admitting: Family Medicine

## 2018-12-26 LAB — VON WILLEBRAND PANEL
Factor VIII Activity: 145 % — ABNORMAL HIGH (ref 56–140)
Von Willebrand Ag: 83 % (ref 50–200)
Von Willebrand Factor: 60 % (ref 50–200)

## 2018-12-26 LAB — COAG STUDIES INTERP REPORT

## 2018-12-26 NOTE — Telephone Encounter (Signed)
Ozempic PA denied - please file appeal.  Call patient and let her know we are filing an appeal, and that I can start her on Metformin now if she would like to trial this. Thanks!

## 2018-12-30 DIAGNOSIS — F1210 Cannabis abuse, uncomplicated: Secondary | ICD-10-CM | POA: Diagnosis not present

## 2018-12-30 DIAGNOSIS — F5105 Insomnia due to other mental disorder: Secondary | ICD-10-CM | POA: Diagnosis not present

## 2018-12-30 DIAGNOSIS — F603 Borderline personality disorder: Secondary | ICD-10-CM | POA: Diagnosis not present

## 2018-12-30 DIAGNOSIS — F3181 Bipolar II disorder: Secondary | ICD-10-CM | POA: Diagnosis not present

## 2019-01-02 ENCOUNTER — Other Ambulatory Visit: Payer: Self-pay

## 2019-01-02 ENCOUNTER — Ambulatory Visit (INDEPENDENT_AMBULATORY_CARE_PROVIDER_SITE_OTHER): Payer: BLUE CROSS/BLUE SHIELD | Admitting: Obstetrics and Gynecology

## 2019-01-02 ENCOUNTER — Encounter: Payer: Self-pay | Admitting: Obstetrics and Gynecology

## 2019-01-02 ENCOUNTER — Other Ambulatory Visit (HOSPITAL_COMMUNITY)
Admission: RE | Admit: 2019-01-02 | Discharge: 2019-01-02 | Disposition: A | Discharge status: Home / Self Care | Payer: BLUE CROSS/BLUE SHIELD | Source: Ambulatory Visit | Attending: Obstetrics and Gynecology | Admitting: Obstetrics and Gynecology

## 2019-01-02 VITALS — BP 128/84 | Ht 62.0 in | Wt 267.0 lb

## 2019-01-02 DIAGNOSIS — N946 Dysmenorrhea, unspecified: Secondary | ICD-10-CM

## 2019-01-02 DIAGNOSIS — N921 Excessive and frequent menstruation with irregular cycle: Secondary | ICD-10-CM | POA: Insufficient documentation

## 2019-01-02 DIAGNOSIS — Z30430 Encounter for insertion of intrauterine contraceptive device: Secondary | ICD-10-CM

## 2019-01-02 MED ORDER — LEVONORGESTREL 20 MCG/24HR IU IUD: 1.0000 | INTRAUTERINE_SYSTEM | Freq: Once | INTRAUTERINE | 0 refills | Status: DC

## 2019-01-02 NOTE — Progress Notes (Signed)
   Endometrial Biopsy After discussion with the patient regarding her abnormal uterine bleeding I recommended that she proceed with an endometrial biopsy for further diagnosis. The risks, benefits, alternatives, and indications for an endometrial biopsy were discussed with the patient in detail. She understood the risks including infection, bleeding, cervical laceration and uterine perforation.  Verbal consent was obtained.   PROCEDURE NOTE:  [place] endometrial biopsy was performed using aseptic technique with Hibiclens preparation.  The uterus was sounded to a length of 8 cm.  Adequate sampling was obtained with minimal blood loss.  The patient tolerated the procedure well.  Disposition will be pending pathology.  IUD Insertion Procedure Note Patient identified, informed consent performed, consent signed.   Discussed risks of irregular bleeding, cramping, infection, malpositioning, expulsion or uterine perforation of the IUD (1:1000 placements)  which may require further procedure such as laparoscopy.  IUD while effective at preventing pregnancy do not prevent transmission of sexually transmitted diseases and use of barrier methods for this purpose was discussed. Time out was performed.  Urine pregnancy test negative.  Speculum already placed in the vagina.  Cervix previously visualized.  Re-cleaned with Hibiclens x 2.  Previously Grasped anteriorly with a single tooth tenaculum.  Uterus sounded to 8 cm. IUD placed per manufacturer's recommendations.  Strings trimmed to 3 cm. Tenaculum was removed, good hemostasis noted.  Patient tolerated procedure well.   Patient was given post-procedure instructions.  She was advised to have backup contraception for one week.  Patient was also asked to check IUD strings periodically and follow up in 4 weeks for IUD check.   Prentice Docker, MD, [facility], [facility] 01/02/2019 6:12 PM    CC:  Hubbard Hartshorn, [address] [place]  [place], [place] [address]

## 2019-01-06 ENCOUNTER — Telehealth: Payer: Self-pay | Admitting: Family Medicine

## 2019-01-06 DIAGNOSIS — R7303 Prediabetes: Secondary | ICD-10-CM

## 2019-01-06 NOTE — Telephone Encounter (Signed)
Ozempic is denied.  Please ask if she would like to trial metformin.

## 2019-01-07 ENCOUNTER — Encounter: Payer: Self-pay | Admitting: Family Medicine

## 2019-01-09 MED ORDER — METFORMIN HCL ER 500 MG PO TB24: ORAL_TABLET | ORAL | 1 refills | Status: DC

## 2019-01-09 NOTE — Addendum Note (Signed)
Addended by: Hubbard Hartshorn on: 01/09/2019 02:43 PM   Modules accepted: Orders

## 2019-01-09 NOTE — Telephone Encounter (Signed)
Insurance will not approve. Please send in Metformin

## 2019-01-13 DIAGNOSIS — F1210 Cannabis abuse, uncomplicated: Secondary | ICD-10-CM | POA: Diagnosis not present

## 2019-01-13 DIAGNOSIS — F5105 Insomnia due to other mental disorder: Secondary | ICD-10-CM | POA: Diagnosis not present

## 2019-01-13 DIAGNOSIS — F603 Borderline personality disorder: Secondary | ICD-10-CM | POA: Diagnosis not present

## 2019-01-13 DIAGNOSIS — F3181 Bipolar II disorder: Secondary | ICD-10-CM | POA: Diagnosis not present

## 2019-01-14 DIAGNOSIS — F603 Borderline personality disorder: Secondary | ICD-10-CM | POA: Diagnosis not present

## 2019-01-14 DIAGNOSIS — F3181 Bipolar II disorder: Secondary | ICD-10-CM | POA: Diagnosis not present

## 2019-01-15 DIAGNOSIS — F332 Major depressive disorder, recurrent severe without psychotic features: Secondary | ICD-10-CM | POA: Diagnosis not present

## 2019-01-24 DIAGNOSIS — F332 Major depressive disorder, recurrent severe without psychotic features: Secondary | ICD-10-CM | POA: Diagnosis not present

## 2019-01-29 ENCOUNTER — Ambulatory Visit (INDEPENDENT_AMBULATORY_CARE_PROVIDER_SITE_OTHER): Payer: BLUE CROSS/BLUE SHIELD | Admitting: Obstetrics and Gynecology

## 2019-01-29 ENCOUNTER — Other Ambulatory Visit: Payer: Self-pay

## 2019-01-29 ENCOUNTER — Encounter: Payer: Self-pay | Admitting: Obstetrics and Gynecology

## 2019-01-29 DIAGNOSIS — Z30431 Encounter for routine checking of intrauterine contraceptive device: Secondary | ICD-10-CM

## 2019-01-29 DIAGNOSIS — N921 Excessive and frequent menstruation with irregular cycle: Secondary | ICD-10-CM

## 2019-01-29 DIAGNOSIS — N946 Dysmenorrhea, unspecified: Secondary | ICD-10-CM | POA: Diagnosis not present

## 2019-01-29 MED ORDER — KETOROLAC TROMETHAMINE 10 MG PO TABS: 10.0000 mg | ORAL_TABLET | Freq: Four times a day (QID) | ORAL | 0 refills | Status: DC | PRN

## 2019-01-29 NOTE — Progress Notes (Signed)
Virtual Visit via Telephone Note  I connected with Amanda Nash on 01/29/19 at  8:50 AM EDT by telephone and verified that I am speaking with the correct person using two identifiers.   I discussed the limitations, risks, security and privacy concerns of performing an evaluation and management service by telephone and the availability of in person appointments. I also discussed with the patient that there may be a patient responsible charge related to this service. The patient expressed understanding and agreed to proceed.  The patient was at home I spoke with the patient from my  Prentice Docker, MD  The names of people involved in this encounter were: [name] and Prentice Docker, MD.   History of Present Illness: Ms. Amanda Nash presents for IUD string check via telephone.  She had a Mirena placed 4 weeks ago.  Since placement of her IUD she had some vaginal bleeding.  She reports cramping or discomfort.  She has had several episodes of bad pain, as if she has ruptured a cyst (this is how she describes the sensation).  She has had intercourse since placement.  She has not checked the strings.  She denies any fever, chills, nausea, vomiting, or other complaints.  She is having some "spikes" of pain this morning.  She says the pain is on her left side. She rates the pain as 6/10.     Observations/Objective: Physical Exam could not be performed. Because of the COVID-19 outbreak this visit was performed over the phone and not in person.   Assessment and Plan: 33 y.o. [id number] female who is 4 weeks post placement of Mirena IUD. She is still is having some bleeding and definitely some pain.    Follow Up Instructions: Patient instructed to go to ER for severe pain. Will rx toradol for breakthrough pain for now.  Instructed patient not to take other NSAIDs while taking toradol   I discussed the assessment and treatment plan with the patient. The patient was provided an opportunity to  ask questions and all were answered. The patient agreed with the plan and demonstrated an understanding of the instructions.   The patient was advised to call back or seek an in-person evaluation if the symptoms worsen or if the condition fails to improve as anticipated.  I provided 17 minutes of non-face-to-face time during this encounter.  Prentice Docker, MD  [facility], [facility] 01/29/2019 9:19 AM

## 2019-02-05 DIAGNOSIS — F603 Borderline personality disorder: Secondary | ICD-10-CM | POA: Diagnosis not present

## 2019-02-06 DIAGNOSIS — F1210 Cannabis abuse, uncomplicated: Secondary | ICD-10-CM | POA: Diagnosis not present

## 2019-02-06 DIAGNOSIS — F603 Borderline personality disorder: Secondary | ICD-10-CM | POA: Diagnosis not present

## 2019-02-06 DIAGNOSIS — F5105 Insomnia due to other mental disorder: Secondary | ICD-10-CM | POA: Diagnosis not present

## 2019-02-06 DIAGNOSIS — F3181 Bipolar II disorder: Secondary | ICD-10-CM | POA: Diagnosis not present

## 2019-02-13 ENCOUNTER — Encounter: Payer: Self-pay | Admitting: Family Medicine

## 2019-02-24 ENCOUNTER — Encounter: Payer: Self-pay | Admitting: Family Medicine

## 2019-02-25 ENCOUNTER — Encounter: Payer: Self-pay | Admitting: Emergency Medicine

## 2019-02-25 ENCOUNTER — Emergency Department
Admission: EM | Admit: 2019-02-25 | Discharge: 2019-02-25 | Disposition: A | Discharge status: Home / Self Care | Payer: BC Managed Care – PPO | Attending: Emergency Medicine | Admitting: Emergency Medicine

## 2019-02-25 ENCOUNTER — Other Ambulatory Visit: Payer: Self-pay

## 2019-02-25 DIAGNOSIS — Z5321 Procedure and treatment not carried out due to patient leaving prior to being seen by health care provider: Secondary | ICD-10-CM | POA: Diagnosis not present

## 2019-02-25 DIAGNOSIS — R109 Unspecified abdominal pain: Secondary | ICD-10-CM | POA: Diagnosis not present

## 2019-02-25 LAB — BASIC METABOLIC PANEL
Anion gap: 7 (ref 5–15)
BUN: 15 mg/dL (ref 6–20)
CO2: 24 mmol/L (ref 22–32)
Calcium: 8.7 mg/dL — ABNORMAL LOW (ref 8.9–10.3)
Chloride: 106 mmol/L (ref 98–111)
Creatinine, Ser: 1.1 mg/dL — ABNORMAL HIGH (ref 0.44–1.00)
GFR calc Af Amer: >60 mL/min (ref >60)
GFR calc non Af Amer: >60 mL/min (ref >60)
Glucose, Bld: 101 mg/dL — ABNORMAL HIGH (ref 70–99)
Potassium: 4 mmol/L (ref 3.5–5.1)
Sodium: 137 mmol/L (ref 135–145)

## 2019-02-25 LAB — CBC
HCT: 33.9 % — ABNORMAL LOW (ref 36.0–46.0)
Hemoglobin: 10.1 g/dL — ABNORMAL LOW (ref 12.0–15.0)
MCH: 20.5 pg — ABNORMAL LOW (ref 26.0–34.0)
MCHC: 29.8 g/dL — ABNORMAL LOW (ref 30.0–36.0)
MCV: 68.9 fL — ABNORMAL LOW (ref 80.0–100.0)
Platelets: 402 10*3/uL — ABNORMAL HIGH (ref 150–400)
RBC: 4.92 MIL/uL (ref 3.87–5.11)
RDW: 18.6 % — ABNORMAL HIGH (ref 11.5–15.5)
WBC: 8 10*3/uL (ref 4.0–10.5)
nRBC: 0 % (ref 0.0–0.2)

## 2019-02-25 LAB — POCT PREGNANCY, URINE: Preg Test, Ur: NEGATIVE

## 2019-02-25 NOTE — ED Triage Notes (Signed)
Pt here with c/o left and right sided lower abd pain, diagnosed with ovarian cysts, mirana inserted about 6 weeks ago, pt still having intermittent "sharp/dull" pain. NAD.

## 2019-02-26 ENCOUNTER — Other Ambulatory Visit: Payer: Self-pay | Admitting: Obstetrics and Gynecology

## 2019-02-26 DIAGNOSIS — F3181 Bipolar II disorder: Secondary | ICD-10-CM | POA: Diagnosis not present

## 2019-02-26 DIAGNOSIS — F5105 Insomnia due to other mental disorder: Secondary | ICD-10-CM | POA: Diagnosis not present

## 2019-02-26 DIAGNOSIS — F1210 Cannabis abuse, uncomplicated: Secondary | ICD-10-CM | POA: Diagnosis not present

## 2019-02-26 DIAGNOSIS — F603 Borderline personality disorder: Secondary | ICD-10-CM | POA: Diagnosis not present

## 2019-02-26 DIAGNOSIS — G8929 Other chronic pain: Secondary | ICD-10-CM | POA: Insufficient documentation

## 2019-02-26 NOTE — Telephone Encounter (Signed)
Can you please figure out how to get this patient seen by an MD this week after getting an ultrasound? I won't be around after today.  So, it will likely have to be another MD.  If there are no [place] appointments, she'll have to have her scan done at an imaging facility.  Let me know which type of ultrasound order to place based on where it needs to be. Thanks!!

## 2019-02-28 ENCOUNTER — Ambulatory Visit
Admission: RE | Admit: 2019-02-28 | Discharge: 2019-02-28 | Disposition: A | Discharge status: Home / Self Care | Payer: BC Managed Care – PPO | Source: Ambulatory Visit | Attending: Obstetrics and Gynecology | Admitting: Obstetrics and Gynecology

## 2019-02-28 ENCOUNTER — Other Ambulatory Visit: Payer: Self-pay

## 2019-02-28 DIAGNOSIS — G8929 Other chronic pain: Secondary | ICD-10-CM | POA: Diagnosis not present

## 2019-02-28 DIAGNOSIS — R102 Pelvic and perineal pain: Secondary | ICD-10-CM | POA: Insufficient documentation

## 2019-02-28 DIAGNOSIS — N83201 Unspecified ovarian cyst, right side: Secondary | ICD-10-CM | POA: Diagnosis not present

## 2019-02-28 DIAGNOSIS — F603 Borderline personality disorder: Secondary | ICD-10-CM | POA: Diagnosis not present

## 2019-03-03 ENCOUNTER — Encounter: Payer: Self-pay | Admitting: Obstetrics and Gynecology

## 2019-03-03 ENCOUNTER — Ambulatory Visit (INDEPENDENT_AMBULATORY_CARE_PROVIDER_SITE_OTHER): Payer: BC Managed Care – PPO | Admitting: Obstetrics and Gynecology

## 2019-03-03 ENCOUNTER — Other Ambulatory Visit: Payer: Self-pay

## 2019-03-03 VITALS — BP 144/86 | HR 102 | Ht 62.0 in | Wt 267.0 lb

## 2019-03-03 DIAGNOSIS — G8929 Other chronic pain: Secondary | ICD-10-CM

## 2019-03-03 DIAGNOSIS — N921 Excessive and frequent menstruation with irregular cycle: Secondary | ICD-10-CM

## 2019-03-03 DIAGNOSIS — R102 Pelvic and perineal pain: Secondary | ICD-10-CM | POA: Diagnosis not present

## 2019-03-03 MED ORDER — NORETHINDRONE ACETATE 5 MG PO TABS: 15.0000 mg | ORAL_TABLET | Freq: Every day | ORAL | 2 refills | Status: DC

## 2019-03-03 NOTE — Progress Notes (Signed)
Gynecology Ultrasound Follow Up  Chief Complaint:  Chief Complaint  Patient presents with  . Follow-up    u/s f/u still with excessive pain  Ultrasound performed at outside facility on 02/28/2019  History of Present Illness: Patient is a 33 y.o. female who presents today for ultrasound evaluation of the above .  Ultrasound demonstrates the following findings Adnexa: right ovarian simple cyst measuring 2.9 cm (largely unchanged from April) Uterus: atneverted with endometrial stripe  4.8 mm Additional: IUD in correct location.   She continues to have waves of pain that are severe. They are located in her bilateral lower quadrants. She would like to do whatever I think is best to be done.   Past Medical History:  Diagnosis Date  . Anxiety   . Bipolar 1 disorder ([place]) 2016  . Borderline personality disorder ([place]) 2017  . Depression     Past Surgical History:  Procedure Laterality Date  . DILATATION & CURRETTAGE/HYSTEROSCOPY WITH RESECTOCOPE  2011    Family History  Problem Relation Age of Onset  . Depression Mother   . Anxiety disorder Mother   . Heart disease Mother   . Bipolar disorder Father   . Hypertension Father   . Diabetes Father   . Depression Brother   . Anxiety disorder Brother     Social History   Socioeconomic History  . Marital status: Married    Spouse name: Shela Nevin  . Number of children: Not on file  . Years of education: Not on file  . Highest education level: Not on file  Occupational History  . Occupation: Systems developer    Comment: [employer]  . Financial resource strain: Not hard at all  . Food insecurity    Worry: Never true    Inability: Never true  . Transportation needs    Medical: No    Non-medical: No  Tobacco Use  . Smoking status: Former Smoker    Quit date: 01/31/2005    Years since quitting: 14.0  . Smokeless tobacco: Never Used  Substance and Sexual Activity  . Alcohol use: Yes    Comment: rarely  .  Drug use: Yes    Types: Marijuana  . Sexual activity: Yes    Partners: Male    Birth control/protection: None  Lifestyle  . Physical activity    Days per week: 0 days    Minutes per session: 0 min  . Stress: Rather much  Relationships  . Social connections    Talks on phone: More than three times a week    Gets together: More than three times a week    Attends religious service: More than 4 times per year    Active member of club or organization: No    Attends meetings of clubs or organizations: Never    Relationship status: Married  . Intimate partner violence    Fear of current or ex partner: No    Emotionally abused: No    Physically abused: No    Forced sexual activity: No  Other Topics Concern  . Not on file  Social History Narrative   Live with husband and her brother, has 2 dogs and a bunny at home. Has stepdaughter that does not live with them.    Allergies  Allergen Reactions  . Shellfish Allergy Hives, Itching, Other (See Comments), Rash and Swelling    Prior to Admission medications   Medication Sig Start Date End Date Taking? Authorizing Provider  carbamazepine (TEGRETOL) 200 MG  tablet  12/30/18   [provider]  Insulin Pen Needle (NOVOFINE) 32G X 6 MM MISC 1 each by Does not apply route once a week. Patient not taking: Reported on 12/17/2018 12/03/18   Hubbard Hartshorn, FNP  ketorolac (TORADOL) 10 MG tablet Take 1 tablet (10 mg total) by mouth every 6 (six) hours as needed. 01/29/19   Will Bonnet, MD  levonorgestrel (MIRENA) 20 MCG/24HR IUD 1 Intra Uterine Device (1 each total) by Intrauterine route once for 1 dose. 01/02/19 01/02/19  Will Bonnet, MD  metFORMIN (GLUCOPHAGE-XR) 500 MG 24 hr tablet Take 1 tab once daily x7 days, then increase to 2 tab once daily. 01/09/19   Hubbard Hartshorn, FNP  QUEtiapine (SEROQUEL) 100 MG tablet Take 1 tablet (100 mg total) by mouth at bedtime. 12/24/18   Hubbard Hartshorn, FNP  Semaglutide,0.25 or 0.5MG /DOS,  (OZEMPIC, 0.25 OR 0.5 MG/DOSE,) 2 MG/1.5ML SOPN Inject 0.25 mg into the skin once a week for 28 days, THEN 0.5 mg once a week. Patient not taking: Reported on 12/17/2018 12/03/18 03/31/19  Hubbard Hartshorn, FNP    Physical Exam BP (!) 144/86 (BP Location: Left Arm, Patient Position: Sitting, Cuff Size: Large)   Pulse (!) 102   Ht 5\' 2"  (1.575 m)   Wt 267 lb (121.1 kg)   LMP 02/10/2019 (Approximate) Comment: "Always bleeding"  BMI 48.83 kg/m    General: NAD HEENT: normocephalic, anicteric Pulmonary: No increased work of breathing Extremities: no edema, erythema, or tenderness Neurologic: Grossly intact, normal gait Psychiatric: mood appropriate, affect full   Assessment: 33 y.o. No obstetric history on file.  1. Menorrhagia with irregular cycle   2. Chronic pelvic pain in female      Plan: Problem List Items Addressed This Visit      Other   Menorrhagia with irregular cycle - Primary   Relevant Medications   norethindrone (AYGESTIN) 5 MG tablet   Chronic pelvic pain in female   Relevant Medications   norethindrone (AYGESTIN) 5 MG tablet     Will try treatment for presumptive endometriosis.  Norethindrone Rx provided today.  She and her husband will discuss a cut-off point for deciding if the tx is working and whether she wants to proceed with TLH/BS (discussed keeping ovaries).  20 minutes spent in face to face discussion with > 50% spent in counseling,management, and coordination of care of her chronic pelvic pain and menorrhagia with irregular cycle.   Prentice Docker, MD, [facility], [facility] 03/03/2019 1:34 PM

## 2019-03-11 ENCOUNTER — Ambulatory Visit: Payer: BC Managed Care – PPO | Admitting: Obstetrics and Gynecology

## 2019-03-11 DIAGNOSIS — F603 Borderline personality disorder: Secondary | ICD-10-CM | POA: Diagnosis not present

## 2019-03-13 ENCOUNTER — Encounter: Payer: Self-pay | Admitting: Family Medicine

## 2019-03-19 ENCOUNTER — Encounter: Payer: Self-pay | Admitting: Family Medicine

## 2019-03-25 DIAGNOSIS — F603 Borderline personality disorder: Secondary | ICD-10-CM | POA: Diagnosis not present

## 2019-04-04 ENCOUNTER — Telehealth: Payer: Self-pay

## 2019-04-04 ENCOUNTER — Encounter: Payer: Self-pay | Admitting: Family Medicine

## 2019-04-09 DIAGNOSIS — F603 Borderline personality disorder: Secondary | ICD-10-CM | POA: Diagnosis not present

## 2019-04-21 DIAGNOSIS — F332 Major depressive disorder, recurrent severe without psychotic features: Secondary | ICD-10-CM | POA: Diagnosis not present

## 2019-04-21 DIAGNOSIS — F603 Borderline personality disorder: Secondary | ICD-10-CM | POA: Diagnosis not present

## 2019-04-28 DIAGNOSIS — F3181 Bipolar II disorder: Secondary | ICD-10-CM | POA: Diagnosis not present

## 2019-04-28 DIAGNOSIS — F1210 Cannabis abuse, uncomplicated: Secondary | ICD-10-CM | POA: Diagnosis not present

## 2019-04-28 DIAGNOSIS — F603 Borderline personality disorder: Secondary | ICD-10-CM | POA: Diagnosis not present

## 2019-04-28 DIAGNOSIS — F5105 Insomnia due to other mental disorder: Secondary | ICD-10-CM | POA: Diagnosis not present

## 2019-04-29 ENCOUNTER — Encounter: Payer: Self-pay | Admitting: Family Medicine

## 2019-04-29 ENCOUNTER — Other Ambulatory Visit: Payer: Self-pay

## 2019-04-29 ENCOUNTER — Ambulatory Visit (INDEPENDENT_AMBULATORY_CARE_PROVIDER_SITE_OTHER): Payer: Self-pay | Admitting: Family Medicine

## 2019-04-29 DIAGNOSIS — N921 Excessive and frequent menstruation with irregular cycle: Secondary | ICD-10-CM

## 2019-04-29 DIAGNOSIS — D75839 Thrombocytosis, unspecified: Secondary | ICD-10-CM

## 2019-04-29 DIAGNOSIS — E781 Pure hyperglyceridemia: Secondary | ICD-10-CM

## 2019-04-29 DIAGNOSIS — N946 Dysmenorrhea, unspecified: Secondary | ICD-10-CM

## 2019-04-29 DIAGNOSIS — F319 Bipolar disorder, unspecified: Secondary | ICD-10-CM

## 2019-04-29 DIAGNOSIS — D473 Essential (hemorrhagic) thrombocythemia: Secondary | ICD-10-CM

## 2019-04-29 DIAGNOSIS — D500 Iron deficiency anemia secondary to blood loss (chronic): Secondary | ICD-10-CM

## 2019-04-29 DIAGNOSIS — R7303 Prediabetes: Secondary | ICD-10-CM

## 2019-04-29 NOTE — Progress Notes (Signed)
Name: Amanda Nash   MRN: [id number]    DOB: 1986-06-10   Date:04/29/2019       Progress Note  Subjective  Chief Complaint  Chief Complaint  Patient presents with   Follow-up    I connected with  Amanda Nash  on 04/29/19 at  1:40 PM EDT by a video enabled telemedicine application and verified that I am speaking with the correct person using two identifiers.  I discussed the limitations of evaluation and management by telemedicine and the availability of in person appointments. The patient expressed understanding and agreed to proceed. Staff also discussed with the patient that there may be a patient responsible charge related to this service. Patient Location: Home Provider Location: Home Additional Individuals present: None  HPI  Anemia/thrombocytosis: Seeing Dr. Glennon Nash - had first appt 12/17/2018 and had transvaginal US done.  She has menorrhagia, and did discuss several options for control of bleeding.  She had mirena placed; cramping is worse than normal and is unpredictable.  She is considering hysterectomy as an option as Dr. Glennon Nash suspects that she has endometriosis.  She denies fatigue, but does endorse some easier bruising. Denies blood in stool, dark and tarry stools, blood in urine, or epistaxis.  Stable and slowly improving with Mirena.    Bipolar I Disorder:Had not had treatment in over 4 years. Was diagnosed at ago [age]yo. As a teen she was on depakote 500mg /lamictal 25mg . As a young adult she restarted medication, but can't remember what she was put on. Most recently she was on a medication that made her very drowsy and did not like the way it made her feel. Over the last 4 years she has had many manic episodes. In a 6 month span she had 3-4 manic episodes that lasted for a few days. Manic episodes include restlessness, pressured/rapid speech, irritability/labile mood, hypersexuality. She has never been hospitalized, has never lost a job due to her mental  illness, and has never had a suicide attempt. Used to self harm, has not done so since 2012, though she admits to urges to do so. No SI/HI today, no thoughts of self harm.  She is now seeing psychiatry and therapist (every other week) and is on Saphris and tegretol and it doing really well on this.   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 Tries to eat low sodium and low carb meals.  No family or personal history of pancreatitis or thyroid cancer.   Prediabetes:Ozempic was not approved; metformin caused significant diarrhea. denies polydipsia, polyuria, or polyphagia.  Her husband is diabetic, and she is already using splenda, avoiding carbs as much as possible. Unchanged, will recheck A1C and CMP.   Patient Active Problem List   Diagnosis Date Noted   Chronic pelvic pain in female 02/26/2019   Prediabetes 12/24/2018   Thrombocytosis ([place]) 12/24/2018   Anemia 12/24/2018   Bipolar I disorder ([place]) 12/02/2018   Dysmenorrhea 12/02/2018   Menorrhagia with irregular cycle 12/02/2018   Morbid obesity ([place]) 12/02/2018    Past Surgical History:  Procedure Laterality Date   DILATATION & CURRETTAGE/HYSTEROSCOPY WITH RESECTOCOPE  2011    Family History  Problem Relation Age of Onset   Depression Mother    Anxiety disorder Mother    Heart disease Mother    Bipolar disorder Father    Hypertension Father    Diabetes Father    Depression Brother    Anxiety disorder Brother  Social History   Socioeconomic History   Marital status: Married    Spouse name: Brian0   Number of children: Not on file   Years of education: Not on file   Highest education level: Not on file  Occupational History   Occupation: Systems developer    Comment: [employer]  Social Needs   Financial resource strain: Not hard at all   Food insecurity    Worry: Never true    Inability: Never true   Transportation needs     Medical: No    Non-medical: No  Tobacco Use   Smoking status: Former Smoker    Quit date: 01/31/2005    Years since quitting: 14.2   Smokeless tobacco: Never Used  Substance and Sexual Activity   Alcohol use: Yes    Comment: rarely   Drug use: Yes    Types: Marijuana   Sexual activity: Yes    Partners: Male    Birth control/protection: None  Lifestyle   Physical activity    Days per week: 0 days    Minutes per session: 0 min   Stress: Rather much  Relationships   Social connections    Talks on phone: More than three times a week    Gets together: More than three times a week    Attends religious service: More than 4 times per year    Active member of club or organization: No    Attends meetings of clubs or organizations: Never    Relationship status: Married   Intimate partner violence    Fear of current or ex partner: No    Emotionally abused: No    Physically abused: No    Forced sexual activity: No  Other Topics Concern   Not on file  Social History Narrative   Live with husband and her brother, has 2 dogs and a bunny at home. Has stepdaughter that does not live with them.     Current Outpatient Medications:    carbamazepine (TEGRETOL) 200 MG tablet, , Disp: , Rfl:    SAPHRIS 10 MG SUBL, DISSOLVE 1 TABLET UNDER THE TONGUE AT BEDTIME (DISCONTINUE SEROQUEL), Disp: , Rfl:    Insulin Pen Needle (NOVOFINE) 32G X 6 MM MISC, 1 each by Does not apply route once a week. (Patient not taking: Reported on 12/17/2018), Disp: 100 each, Rfl: 0   ketorolac (TORADOL) 10 MG tablet, Take 1 tablet (10 mg total) by mouth every 6 (six) hours as needed. (Patient not taking: Reported on 03/03/2019), Disp: 30 tablet, Rfl: 0   levonorgestrel (MIRENA) 20 MCG/24HR IUD, 1 Intra Uterine Device (1 each total) by Intrauterine route once for 1 dose., Disp: 1 each, Rfl: 0   metFORMIN (GLUCOPHAGE-XR) 500 MG 24 hr tablet, Take 1 tab once daily x7 days, then increase to 2 tab once daily.  (Patient not taking: Reported on 04/29/2019), Disp: 180 tablet, Rfl: 1   norethindrone (AYGESTIN) 5 MG tablet, Take 3 tablets (15 mg total) by mouth daily. (Patient not taking: Reported on 04/29/2019), Disp: 90 tablet, Rfl: 2  Allergies  Allergen Reactions   Shellfish Allergy Hives, Itching, Other (See Comments), Rash and Swelling    I personally reviewed active problem list, medication list, allergies, health maintenance, notes from last encounter, lab results with the patient/caregiver today.   ROS Constitutional: Negative for fever or weight change.  Respiratory: Negative for cough and shortness of breath.   Cardiovascular: Negative for chest pain or palpitations.  Gastrointestinal: Negative for abdominal pain, no bowel  changes.  Musculoskeletal: Negative for gait problem or joint swelling.  Skin: Negative for rash.  Neurological: Negative for dizziness or headache.  No other specific complaints in a complete review of systems (except as listed in HPI above).  Objective  Virtual encounter, vitals not obtained.  There is no height or weight on file to calculate BMI.  Physical Exam Constitutional: Patient appears well-developed and well-nourished. No distress.  HENT: Head: Normocephalic and atraumatic.  Neck: Normal range of motion. Pulmonary/Chest: Effort normal. No respiratory distress. Speaking in complete sentences Neurological: Pt is alert and oriented to person, place, and time. Coordination, speech are normal.  Psychiatric: Patient has a normal mood and affect. behavior is normal. Judgment and thought content normal.   No results found for this or any previous visit (from the past 72 hour(s)).  PHQ2/9: Depression screen [facility] 2/9 04/29/2019 12/24/2018 12/02/2018  Decreased Interest 2 2 2   Down, Depressed, Hopeless 2 3 3   PHQ - 2 Score 4 5 5   Altered sleeping 1 3 3   Tired, decreased energy 1 3 3   Change in appetite 0 0 1  Feeling bad or failure about yourself  1 3 3     Trouble concentrating 1 0 1  Moving slowly or fidgety/restless 1 2 2   Suicidal thoughts 0 1 2  PHQ-9 Score 9 17 20   Difficult doing work/chores Somewhat difficult Very difficult Extremely dIfficult   PHQ-2/9 Result is positive.    Fall Risk: Fall Risk  04/29/2019 12/24/2018 12/02/2018  Falls in the past year? 1 0 0  Number falls in past yr: 1 0 0  Injury with Fall? 0 0 0  Risk for fall due to : Other (Comment) - -  Follow up - Falls evaluation completed Falls evaluation completed    Assessment & Plan  1. Dysmenorrhea - Seeing D.r Amanda Nash, has Mirena in place  2. Thrombocytosis ([place]) 3. Iron deficiency anemia due to chronic blood loss CBC done in June at ER visit shows near normal platelets and perisstent anemia; we will allow some time for mirena to be in place and for her to follow up with Dr. Glennon Nash prior to another recheck  4. Bipolar I disorder ([place]) Seeing Dr. Nicolasa Ducking and doing well.  5. Menorrhagia with irregular cycle - Seeing D.r Amanda Nash, improving slightly with Mirena  6. Morbid obesity ([place]) - Discussed importance of 150 minutes of physical activity weekly, eat two servings of fish weekly, eat one serving of tree nuts ( cashews, pistachios, pecans, almonds.Marland Kitchen) every other day, eat 6 servings of fruit/vegetables daily and drink plenty of water and avoid sweet beverages.   7. Prediabetes - COMPLETE METABOLIC PANEL WITH GFR - Hemoglobin A1c  8. High triglycerides - Lipid panel   I discussed the assessment and treatment plan with the patient. The patient was provided an opportunity to ask questions and all were answered. The patient agreed with the plan and demonstrated an understanding of the instructions.  The patient was advised to call back or seek an in-person evaluation if the symptoms worsen or if the condition fails to improve as anticipated.  I provided 24 minutes of non-face-to-face time during this encounter.

## 2019-05-02 ENCOUNTER — Encounter: Payer: Self-pay | Admitting: Family Medicine

## 2019-05-07 DIAGNOSIS — F603 Borderline personality disorder: Secondary | ICD-10-CM | POA: Diagnosis not present

## 2019-05-07 DIAGNOSIS — F332 Major depressive disorder, recurrent severe without psychotic features: Secondary | ICD-10-CM | POA: Diagnosis not present

## 2019-05-13 ENCOUNTER — Encounter: Payer: Self-pay | Admitting: Family Medicine

## 2019-05-20 DIAGNOSIS — F332 Major depressive disorder, recurrent severe without psychotic features: Secondary | ICD-10-CM | POA: Diagnosis not present

## 2019-05-20 DIAGNOSIS — F603 Borderline personality disorder: Secondary | ICD-10-CM | POA: Diagnosis not present

## 2019-05-21 ENCOUNTER — Ambulatory Visit: Payer: Self-pay | Admitting: Family Medicine

## 2019-05-22 ENCOUNTER — Ambulatory Visit (INDEPENDENT_AMBULATORY_CARE_PROVIDER_SITE_OTHER): Payer: Self-pay | Admitting: Family Medicine

## 2019-05-22 ENCOUNTER — Encounter: Payer: Self-pay | Admitting: Family Medicine

## 2019-05-22 ENCOUNTER — Other Ambulatory Visit: Payer: Self-pay

## 2019-05-22 VITALS — BP 118/80 | HR 93 | Temp 97.5°F | Resp 16 | Ht 62.0 in | Wt 265.6 lb

## 2019-05-22 DIAGNOSIS — E781 Pure hyperglyceridemia: Secondary | ICD-10-CM | POA: Diagnosis not present

## 2019-05-22 DIAGNOSIS — D500 Iron deficiency anemia secondary to blood loss (chronic): Secondary | ICD-10-CM

## 2019-05-22 DIAGNOSIS — D75839 Thrombocytosis, unspecified: Secondary | ICD-10-CM

## 2019-05-22 DIAGNOSIS — R7303 Prediabetes: Secondary | ICD-10-CM | POA: Diagnosis not present

## 2019-05-22 DIAGNOSIS — N946 Dysmenorrhea, unspecified: Secondary | ICD-10-CM

## 2019-05-22 DIAGNOSIS — N921 Excessive and frequent menstruation with irregular cycle: Secondary | ICD-10-CM

## 2019-05-22 DIAGNOSIS — D473 Essential (hemorrhagic) thrombocythemia: Secondary | ICD-10-CM

## 2019-05-22 DIAGNOSIS — Z1389 Encounter for screening for other disorder: Secondary | ICD-10-CM

## 2019-05-22 NOTE — Progress Notes (Signed)
Name: Amanda Nash   MRN: [id number]    DOB: [birth year]   Date:05/22/2019       Progress Note  Subjective  Chief Complaint  Chief Complaint  Patient presents with  . Nevus    on left arm    HPI  Pt presents with concern for mole on the LEFT upper forearm present for a while now, but over the last several weeks she has noticed that the mole has lightened in color centrally with a dark border and did have clear drainage multiple times now.    Menorrhagia/dysmenorrhea:  She has mirena in place, still having regular periods that are heavy and having a lot of cramping still.  She has been seeing Dr. Glennon Mac with OB/GYN who had apparently ordered 2 different medications both of which were both refused by her insurance.  I encouraged her to follow up with GYN for ongoing care.  She would like her clotting times checked today which we can do - she notes she nicked her forearm the other day and it bled for over 45 minutes from a tiny 0.5cm nick on the arm.  May consider referral to hematology.   Patient Active Problem List   Diagnosis Date Noted  . Chronic pelvic pain in female 02/26/2019  . Prediabetes 12/24/2018  . Thrombocytosis ([place]) 12/24/2018  . Anemia 12/24/2018  . Bipolar I disorder ([place]) 12/02/2018  . Dysmenorrhea 12/02/2018  . Menorrhagia with irregular cycle 12/02/2018  . Morbid obesity ([place]) 12/02/2018    Social History   Tobacco Use  . Smoking status: Former Smoker    Quit date: 01/31/2005    Years since quitting: 14.3  . Smokeless tobacco: Never Used  Substance Use Topics  . Alcohol use: Yes    Comment: rarely     Current Outpatient Medications:  .  carbamazepine (TEGRETOL) 200 MG tablet, 200 mg 2 (two) times daily. , Disp: , Rfl:  .  lansoprazole (PREVACID) 30 MG capsule, Take 30 mg by mouth daily at 12 noon., Disp: , Rfl:  .  SAPHRIS 10 MG SUBL, DISSOLVE 1 TABLET UNDER THE TONGUE AT BEDTIME (DISCONTINUE SEROQUEL), Disp: , Rfl:  .  levonorgestrel (MIRENA) 20  MCG/24HR IUD, 1 Intra Uterine Device (1 each total) by Intrauterine route once for 1 dose., Disp: 1 each, Rfl: 0  Allergies  Allergen Reactions  . Shellfish Allergy Hives, Itching, Other (See Comments), Rash and Swelling    I personally reviewed active problem list, medication list, allergies, health maintenance, notes from last encounter, lab results with the patient/caregiver today.  ROS  Constitutional: Negative for fever or weight change.  Respiratory: Negative for cough and shortness of breath.   Cardiovascular: Negative for chest pain or palpitations.  Gastrointestinal: Negative for abdominal pain, no bowel changes.  Musculoskeletal: Negative for gait problem or joint swelling.  Skin: Negative for rash. See HPI Neurological: Negative for dizziness or headache.  No other specific complaints in a complete review of systems (except as listed in HPI above).  Objective  Vitals:   05/22/19 0913  BP: 118/80  Pulse: 93  Resp: 16  Temp: (!) 97.5 F (36.4 C)  TempSrc: Oral  SpO2: 99%  Weight: 265 lb 9.6 oz (120.5 kg)  Height: 5\' 2"  (1.575 m)   Body mass index is 48.58 kg/m.  Nursing Note and Vital Signs reviewed.  Physical Exam  Constitutional: Patient appears well-developed and well-nourished. No distress.  HENT: Head: Normocephalic and atraumatic. Eyes: Conjunctivae and EOM are normal. No scleral  icterus. Neck: Normal range of motion. Neck supple. No JVD present.  Cardiovascular: Normal rate, regular rhythm and normal heart sounds.  No murmur heard. No BLE edema. Pulmonary/Chest: Effort normal and breath sounds normal. No respiratory distress. Musculoskeletal: Normal range of motion, no joint effusions. No gross deformities Neurological: Pt is alert and oriented to person, place, and time. No cranial nerve deficit. Coordination, balance, strength, speech and gait are normal.  Skin: Skin is warm and dry. No rash noted. No erythema. There is a small 32mm nevus to the left  upper lateral forearm that is flesh colored int he center with a single dark hair centrally, the border is dark brown; entire lesion is slightly indurated. Psychiatric: Patient has a normal mood and affect. behavior is normal. Judgment and thought content normal.  No results found for this or any previous visit (from the past 72 hour(s)).  Assessment & Plan  1. Encounter for surveillance of abnormal nevi - Ambulatory referral to Dermatology - Sending for skin survey and for eval of this particular nevi   2. Thrombocytosis ([place]) - INR/PT - CBC with Differential/Platelet  3. Iron deficiency anemia due to chronic blood loss - Follow up with Dr. Glennon Mac; may consider referral to hematology in the future. - INR/PT - CBC with Differential/Platelet  4. Menorrhagia with irregular cycle - INR/PT - CBC with Differential/Platelet  5. Dysmenorrhea - INR/PT - CBC with Differential/Platelet  -Red flags and when to present for emergency care or RTC including fever >101.16F, chest pain, shortness of breath, new/worsening/un-resolving symptoms, reviewed with patient at time of visit. Follow up and care instructions discussed and provided in AVS.

## 2019-05-23 LAB — LIPID PANEL
Cholesterol: 198 mg/dL (ref <200)
HDL: 68 mg/dL (ref >=50)
LDL Cholesterol (Calc): 104 mg/dL (calc) — ABNORMAL HIGH
Non-HDL Cholesterol (Calc): 130 mg/dL (calc) — ABNORMAL HIGH (ref <130)
Total CHOL/HDL Ratio: 2.9 (calc) (ref <5.0)
Triglycerides: 149 mg/dL (ref <150)

## 2019-05-23 LAB — COMPLETE METABOLIC PANEL WITH GFR
AG Ratio: 1.3 (calc) (ref 1.0–2.5)
ALT: 16 U/L (ref 6–29)
AST: 14 U/L (ref 10–30)
Albumin: 4 g/dL (ref 3.6–5.1)
Alkaline phosphatase (APISO): 74 U/L (ref 31–125)
BUN: 18 mg/dL (ref 7–25)
CO2: 28 mmol/L (ref 20–32)
Calcium: 9.1 mg/dL (ref 8.6–10.2)
Chloride: 103 mmol/L (ref 98–110)
Creat: 0.78 mg/dL (ref 0.50–1.10)
GFR, Est African American: 116 mL/min/{1.73_m2} (ref >=60)
GFR, Est Non African American: 100 mL/min/{1.73_m2} (ref >=60)
Globulin: 3.2 g/dL (calc) (ref 1.9–3.7)
Glucose, Bld: 106 mg/dL — ABNORMAL HIGH (ref 65–99)
Potassium: 4.5 mmol/L (ref 3.5–5.3)
Sodium: 138 mmol/L (ref 135–146)
Total Bilirubin: 0.2 mg/dL (ref 0.2–1.2)
Total Protein: 7.2 g/dL (ref 6.1–8.1)

## 2019-05-23 LAB — CBC WITH DIFFERENTIAL/PLATELET
Absolute Monocytes: 445 cells/uL (ref 200–950)
Basophils Absolute: 40 cells/uL (ref 0–200)
Basophils Relative: 0.7 %
Eosinophils Absolute: 97 cells/uL (ref 15–500)
Eosinophils Relative: 1.7 %
HCT: 35.2 % (ref 35.0–45.0)
Hemoglobin: 10.7 g/dL — ABNORMAL LOW (ref 11.7–15.5)
Lymphs Abs: 2274 cells/uL (ref 850–3900)
MCH: 21.1 pg — ABNORMAL LOW (ref 27.0–33.0)
MCHC: 30.4 g/dL — ABNORMAL LOW (ref 32.0–36.0)
MCV: 69.4 fL — ABNORMAL LOW (ref 80.0–100.0)
MPV: 9 fL (ref 7.5–12.5)
Monocytes Relative: 7.8 %
Neutro Abs: 2844 cells/uL (ref 1500–7800)
Neutrophils Relative %: 49.9 %
Platelets: 352 10*3/uL (ref 140–400)
RBC: 5.07 10*6/uL (ref 3.80–5.10)
RDW: 18.2 % — ABNORMAL HIGH (ref 11.0–15.0)
Total Lymphocyte: 39.9 %
WBC: 5.7 10*3/uL (ref 3.8–10.8)

## 2019-05-23 LAB — PROTIME-INR
INR: 1
Prothrombin Time: 10.1 s (ref 9.0–11.5)

## 2019-05-23 LAB — HEMOGLOBIN A1C
Hgb A1c MFr Bld: 6 % of total Hgb — ABNORMAL HIGH (ref <5.7)
Mean Plasma Glucose: 126 (calc)
eAG (mmol/L): 7 (calc)

## 2019-05-28 ENCOUNTER — Encounter: Payer: Self-pay | Admitting: Family Medicine

## 2019-05-29 ENCOUNTER — Encounter: Payer: Self-pay | Admitting: Family Medicine

## 2019-06-04 DIAGNOSIS — F603 Borderline personality disorder: Secondary | ICD-10-CM | POA: Diagnosis not present

## 2019-06-04 DIAGNOSIS — F332 Major depressive disorder, recurrent severe without psychotic features: Secondary | ICD-10-CM | POA: Diagnosis not present

## 2019-06-18 DIAGNOSIS — F332 Major depressive disorder, recurrent severe without psychotic features: Secondary | ICD-10-CM | POA: Diagnosis not present

## 2019-06-18 DIAGNOSIS — F603 Borderline personality disorder: Secondary | ICD-10-CM | POA: Diagnosis not present

## 2019-07-02 DIAGNOSIS — F332 Major depressive disorder, recurrent severe without psychotic features: Secondary | ICD-10-CM | POA: Diagnosis not present

## 2019-07-02 DIAGNOSIS — F603 Borderline personality disorder: Secondary | ICD-10-CM | POA: Diagnosis not present

## 2019-07-16 DIAGNOSIS — F332 Major depressive disorder, recurrent severe without psychotic features: Secondary | ICD-10-CM | POA: Diagnosis not present

## 2019-07-16 DIAGNOSIS — F603 Borderline personality disorder: Secondary | ICD-10-CM | POA: Diagnosis not present

## 2019-07-30 DIAGNOSIS — F332 Major depressive disorder, recurrent severe without psychotic features: Secondary | ICD-10-CM | POA: Diagnosis not present

## 2019-07-30 DIAGNOSIS — F603 Borderline personality disorder: Secondary | ICD-10-CM | POA: Diagnosis not present

## 2019-07-31 DIAGNOSIS — F1210 Cannabis abuse, uncomplicated: Secondary | ICD-10-CM | POA: Diagnosis not present

## 2019-07-31 DIAGNOSIS — F603 Borderline personality disorder: Secondary | ICD-10-CM | POA: Diagnosis not present

## 2019-07-31 DIAGNOSIS — F3181 Bipolar II disorder: Secondary | ICD-10-CM | POA: Diagnosis not present

## 2019-07-31 DIAGNOSIS — F5105 Insomnia due to other mental disorder: Secondary | ICD-10-CM | POA: Diagnosis not present

## 2019-08-13 DIAGNOSIS — F332 Major depressive disorder, recurrent severe without psychotic features: Secondary | ICD-10-CM | POA: Diagnosis not present

## 2019-08-13 DIAGNOSIS — F603 Borderline personality disorder: Secondary | ICD-10-CM | POA: Diagnosis not present

## 2019-08-20 DIAGNOSIS — F1210 Cannabis abuse, uncomplicated: Secondary | ICD-10-CM | POA: Diagnosis not present

## 2019-08-20 DIAGNOSIS — F5105 Insomnia due to other mental disorder: Secondary | ICD-10-CM | POA: Diagnosis not present

## 2019-08-20 DIAGNOSIS — F3181 Bipolar II disorder: Secondary | ICD-10-CM | POA: Diagnosis not present

## 2019-08-20 DIAGNOSIS — F603 Borderline personality disorder: Secondary | ICD-10-CM | POA: Diagnosis not present

## 2019-08-27 DIAGNOSIS — F603 Borderline personality disorder: Secondary | ICD-10-CM | POA: Diagnosis not present

## 2019-08-27 DIAGNOSIS — F332 Major depressive disorder, recurrent severe without psychotic features: Secondary | ICD-10-CM | POA: Diagnosis not present

## 2019-09-15 DIAGNOSIS — F3181 Bipolar II disorder: Secondary | ICD-10-CM | POA: Diagnosis not present

## 2019-09-15 DIAGNOSIS — F5105 Insomnia due to other mental disorder: Secondary | ICD-10-CM | POA: Diagnosis not present

## 2019-09-15 DIAGNOSIS — F603 Borderline personality disorder: Secondary | ICD-10-CM | POA: Diagnosis not present

## 2019-09-17 DIAGNOSIS — F603 Borderline personality disorder: Secondary | ICD-10-CM | POA: Diagnosis not present

## 2019-09-17 DIAGNOSIS — F332 Major depressive disorder, recurrent severe without psychotic features: Secondary | ICD-10-CM | POA: Diagnosis not present

## 2019-09-22 ENCOUNTER — Encounter: Payer: Self-pay | Admitting: Family Medicine

## 2019-10-01 DIAGNOSIS — F332 Major depressive disorder, recurrent severe without psychotic features: Secondary | ICD-10-CM | POA: Diagnosis not present

## 2019-10-01 DIAGNOSIS — F603 Borderline personality disorder: Secondary | ICD-10-CM | POA: Diagnosis not present

## 2019-10-06 DIAGNOSIS — F603 Borderline personality disorder: Secondary | ICD-10-CM | POA: Diagnosis not present

## 2019-10-06 DIAGNOSIS — F1210 Cannabis abuse, uncomplicated: Secondary | ICD-10-CM | POA: Diagnosis not present

## 2019-10-06 DIAGNOSIS — F3181 Bipolar II disorder: Secondary | ICD-10-CM | POA: Diagnosis not present

## 2019-10-06 DIAGNOSIS — F5105 Insomnia due to other mental disorder: Secondary | ICD-10-CM | POA: Diagnosis not present

## 2019-10-22 DIAGNOSIS — F603 Borderline personality disorder: Secondary | ICD-10-CM | POA: Diagnosis not present

## 2019-10-22 DIAGNOSIS — F332 Major depressive disorder, recurrent severe without psychotic features: Secondary | ICD-10-CM | POA: Diagnosis not present

## 2019-11-05 DIAGNOSIS — F332 Major depressive disorder, recurrent severe without psychotic features: Secondary | ICD-10-CM | POA: Diagnosis not present

## 2019-11-05 DIAGNOSIS — F603 Borderline personality disorder: Secondary | ICD-10-CM | POA: Diagnosis not present

## 2019-11-13 DIAGNOSIS — F5105 Insomnia due to other mental disorder: Secondary | ICD-10-CM | POA: Diagnosis not present

## 2019-11-13 DIAGNOSIS — F3181 Bipolar II disorder: Secondary | ICD-10-CM | POA: Diagnosis not present

## 2019-11-13 DIAGNOSIS — F603 Borderline personality disorder: Secondary | ICD-10-CM | POA: Diagnosis not present

## 2019-11-13 DIAGNOSIS — F1210 Cannabis abuse, uncomplicated: Secondary | ICD-10-CM | POA: Diagnosis not present

## 2019-11-26 DIAGNOSIS — F603 Borderline personality disorder: Secondary | ICD-10-CM | POA: Diagnosis not present

## 2019-11-26 DIAGNOSIS — F332 Major depressive disorder, recurrent severe without psychotic features: Secondary | ICD-10-CM | POA: Diagnosis not present

## 2019-12-04 DIAGNOSIS — F3181 Bipolar II disorder: Secondary | ICD-10-CM | POA: Diagnosis not present

## 2019-12-04 DIAGNOSIS — F1210 Cannabis abuse, uncomplicated: Secondary | ICD-10-CM | POA: Diagnosis not present

## 2019-12-04 DIAGNOSIS — F603 Borderline personality disorder: Secondary | ICD-10-CM | POA: Diagnosis not present

## 2019-12-04 DIAGNOSIS — F5105 Insomnia due to other mental disorder: Secondary | ICD-10-CM | POA: Diagnosis not present

## 2019-12-10 DIAGNOSIS — F603 Borderline personality disorder: Secondary | ICD-10-CM | POA: Diagnosis not present

## 2019-12-10 DIAGNOSIS — F332 Major depressive disorder, recurrent severe without psychotic features: Secondary | ICD-10-CM | POA: Diagnosis not present

## 2019-12-15 ENCOUNTER — Encounter: Payer: Self-pay | Admitting: Family Medicine

## 2019-12-24 DIAGNOSIS — F332 Major depressive disorder, recurrent severe without psychotic features: Secondary | ICD-10-CM | POA: Diagnosis not present

## 2019-12-24 DIAGNOSIS — F603 Borderline personality disorder: Secondary | ICD-10-CM | POA: Diagnosis not present

## 2020-01-07 DIAGNOSIS — F332 Major depressive disorder, recurrent severe without psychotic features: Secondary | ICD-10-CM | POA: Diagnosis not present

## 2020-01-07 DIAGNOSIS — F603 Borderline personality disorder: Secondary | ICD-10-CM | POA: Diagnosis not present

## 2020-01-20 DIAGNOSIS — F1210 Cannabis abuse, uncomplicated: Secondary | ICD-10-CM | POA: Diagnosis not present

## 2020-01-20 DIAGNOSIS — F5105 Insomnia due to other mental disorder: Secondary | ICD-10-CM | POA: Diagnosis not present

## 2020-01-20 DIAGNOSIS — F3181 Bipolar II disorder: Secondary | ICD-10-CM | POA: Diagnosis not present

## 2020-01-20 DIAGNOSIS — F603 Borderline personality disorder: Secondary | ICD-10-CM | POA: Diagnosis not present

## 2020-01-21 DIAGNOSIS — F603 Borderline personality disorder: Secondary | ICD-10-CM | POA: Diagnosis not present

## 2020-01-21 DIAGNOSIS — F332 Major depressive disorder, recurrent severe without psychotic features: Secondary | ICD-10-CM | POA: Diagnosis not present

## 2020-02-04 DIAGNOSIS — F603 Borderline personality disorder: Secondary | ICD-10-CM | POA: Diagnosis not present

## 2020-02-04 DIAGNOSIS — F332 Major depressive disorder, recurrent severe without psychotic features: Secondary | ICD-10-CM | POA: Diagnosis not present

## 2020-02-20 DIAGNOSIS — F603 Borderline personality disorder: Secondary | ICD-10-CM | POA: Diagnosis not present

## 2020-02-20 DIAGNOSIS — F332 Major depressive disorder, recurrent severe without psychotic features: Secondary | ICD-10-CM | POA: Diagnosis not present

## 2020-02-26 DIAGNOSIS — F603 Borderline personality disorder: Secondary | ICD-10-CM | POA: Diagnosis not present

## 2020-02-26 DIAGNOSIS — Z79899 Other long term (current) drug therapy: Secondary | ICD-10-CM | POA: Diagnosis not present

## 2020-02-26 DIAGNOSIS — F3181 Bipolar II disorder: Secondary | ICD-10-CM | POA: Diagnosis not present

## 2020-03-05 DIAGNOSIS — F332 Major depressive disorder, recurrent severe without psychotic features: Secondary | ICD-10-CM | POA: Diagnosis not present

## 2020-03-05 DIAGNOSIS — F603 Borderline personality disorder: Secondary | ICD-10-CM | POA: Diagnosis not present

## 2020-03-16 ENCOUNTER — Encounter: Payer: Self-pay | Admitting: Internal Medicine

## 2020-03-16 ENCOUNTER — Ambulatory Visit: Payer: Self-pay

## 2020-03-16 ENCOUNTER — Other Ambulatory Visit: Payer: Self-pay

## 2020-03-16 ENCOUNTER — Telehealth (INDEPENDENT_AMBULATORY_CARE_PROVIDER_SITE_OTHER): Payer: Self-pay | Admitting: Internal Medicine

## 2020-03-16 VITALS — Ht 62.0 in | Wt 270.0 lb

## 2020-03-16 DIAGNOSIS — U071 COVID-19: Secondary | ICD-10-CM | POA: Diagnosis not present

## 2020-03-16 DIAGNOSIS — Z20822 Contact with and (suspected) exposure to covid-19: Secondary | ICD-10-CM | POA: Diagnosis not present

## 2020-03-16 DIAGNOSIS — R05 Cough: Secondary | ICD-10-CM

## 2020-03-16 DIAGNOSIS — J069 Acute upper respiratory infection, unspecified: Secondary | ICD-10-CM

## 2020-03-16 NOTE — Telephone Encounter (Signed)
  Patient called stating that she had a tele visit with Dr Roxan Hockey today and he told her to go test for COVID-19.  She called to say she was positive.  Patient was read COVID symptom tiers and treatment plans for each.  She was read criteria for ending isolation.  Good preventative practices were discussed.  She was told to have other family members quarantine 14 day and monitor for symptoms. They may test 5-7 days after exposure or when they develop symptoms. She verbalized understanding of all information. Reason for Disposition . [1] [id number] diagnosed by HCP (doctor, NP or PA) AND [2] mild symptoms (e.g., cough, fever, others) AND [5] no complications or SOB  Answer Assessment - Initial Assessment Questions 1. COVID-19 DIAGNOSIS: "Who made your Coronavirus (COVID-19) diagnosis?" "Was it confirmed by a positive lab test?" If not diagnosed by a HCP, ask "Are there lots of cases (community spread) where you live?" (See public health department website, if unsure)     Today dx 2. COVID-19 EXPOSURE: "Was there any known exposure to COVID before the symptoms began?" CDC Definition of close contact: within 6 feet (2 meters) for a total of 15 minutes or more over a 24-hour period.      N/A 3. ONSET: "When did the COVID-19 symptoms start?"     Saturday 4. WORST SYMPTOM: "What is your worst symptom?" (e.g., cough, fever, shortness of breath, muscle aches)     sorethroat cough, headache body aches 5. COUGH: "Do you have a cough?" If Yes, ask: "How bad is the cough?"       dry 6. FEVER: "Do you have a fever?" If Yes, ask: "What is your temperature, how was it measured, and when did it start?"     97.4 7. RESPIRATORY STATUS: "Describe your breathing?" (e.g., shortness of breath, wheezing, unable to speak)      Walking makes her tired 8. BETTER-SAME-WORSE: "Are you getting better, staying the same or getting worse compared to yesterday?"  If getting worse, ask, "In what way?"     worse 9. HIGH RISK  DISEASE: "Do you have any chronic medical problems?" (e.g., asthma, heart or lung disease, weak immune system, obesity, etc.)     no 10. PREGNANCY: "Is there any chance you are pregnant?" "When was your last menstrual period?"      No merina 11. OTHER SYMPTOMS: "Do you have any other symptoms?"  (e.g., chills, fatigue, headache, loss of smell or taste, muscle pain, sore throat; new loss of smell or taste especially support the diagnosis of COVID-19)       Fatigue, muscle pain  Protocols used: CORONAVIRUS (COVID-19) DIAGNOSED OR SUSPECTED-A-AH

## 2020-03-16 NOTE — Progress Notes (Signed)
Name: Amanda Nash   MRN: [id number]    DOB: 06/14/1986   Date:03/16/2020       Progress Note  Subjective  Chief Complaint  Chief Complaint  Patient presents with  . Sore Throat    Has not had a covid test  . Headache  . Fever    Onset Saturday 03/13/20 did not take her temp, says she is hot and cold and wakes up at night with sweats  . Cough    I connected with  [name] on 03/16/20 at  2:40 PM EDT by telephone and verified that I am speaking with the correct person using two identifiers.  I discussed the limitations, risks, security and privacy concerns of performing an evaluation and management service by telephone and the availability of in person appointments. Staff also discussed with the patient that there may be a patient responsible charge related to this service. Patient Location: Home Provider Location: [facility] Additional Individuals present: none  HPI  Patient is a 34 year old female patient of Amanda Nash today for a televisit with cough, sore throat, headache, and fever. Her symptoms started on Saturday 7/3, woke up with sore throat, has worsened she noted it does not feel like a strep sore throat as she has had that many times in her past  + cough, no production, can wake her up at times No marked SOB No documented fever - not take temps,  has been feeling feverish as feels hot and cold, and often wakes up at night with sweats. + sore throat.  +mild congestion, yellowish phlegm, not dark, "a little see through",  No  loss of smell, loss of taste Mild N/ No V + HA + muscle aches, across lower back especially No marked loose stools/diarrhea No CP, passing out episodes Tried Dayquil/Nyquil - helps a little bet noted  Scientist, physiological for [employer], works at home, told me they will not let her into work when she has symptoms  Comorbid conditions reviewed No asthma/COPD hx,  No h/o DM (prediabetes noted), heart disease, CKD,  + morbid obesity She  has not had a Covid test, nor the Covid vaccine.  Patient Active Problem List   Diagnosis Date Noted  . Chronic pelvic pain in female 02/26/2019  . Prediabetes 12/24/2018  . Thrombocytosis ([place]) 12/24/2018  . Anemia 12/24/2018  . Bipolar I disorder ([place]) 12/02/2018  . Dysmenorrhea 12/02/2018  . Menorrhagia with irregular cycle 12/02/2018  . Morbid obesity ([place]) 12/02/2018    Past Surgical History:  Procedure Laterality Date  . DILATATION & CURRETTAGE/HYSTEROSCOPY WITH RESECTOCOPE  2011    Family History  Problem Relation Age of Onset  . Depression Mother   . Anxiety disorder Mother   . Heart disease Mother   . Bipolar disorder Father   . Hypertension Father   . Diabetes Father   . Depression Brother   . Anxiety disorder Brother     Social History   Tobacco Use  . Smoking status: Former Smoker    Quit date: 01/31/2005    Years since quitting: 15.1  . Smokeless tobacco: Never Used  Substance Use Topics  . Alcohol use: Yes    Comment: rarely     Current Outpatient Medications:  .  carbamazepine (TEGRETOL) 200 MG tablet, 200 mg 2 (two) times daily. , Disp: , Rfl:  .  lansoprazole (PREVACID) 30 MG capsule, Take 30 mg by mouth daily at 12 noon., Disp: , Rfl:  .  levonorgestrel (MIRENA) 20  MCG/24HR IUD, 1 Intra Uterine Device (1 each total) by Intrauterine route once for 1 dose., Disp: 1 each, Rfl: 0 .  citalopram (CELEXA) 10 MG tablet, Take 10 mg by mouth every morning., Disp: , Rfl:  .  OLANZapine (ZYPREXA) 2.5 MG tablet, Take by mouth., Disp: , Rfl:   No Active Allergies  With staff assistance, above reviewed with the patient today.  ROS: As per HPI, otherwise no specific complaints on a limited and focused system review   Objective  Virtual encounter, vitals not obtained.  Body mass index is 49.38 kg/m.  Physical Exam   Appears in NAD via conversation, pleasant Breathing: No obvious respiratory distress. Speaking in complete sentences Neurological: Pt  is alert and oriented, Speech is normal Psychiatric: Patient has a normal mood and affect, Judgment and thought content normal.   No results found for this or any previous visit (from the past 72 hour(s)).  PHQ2/9: Depression screen [facility] 2/9 03/16/2020 05/22/2019 04/29/2019 12/24/2018 12/02/2018  Decreased Interest 0 2 2 2 2   Down, Depressed, Hopeless 0 2 2 3 3   PHQ - 2 Score 0 4 4 5 5   Altered sleeping 0 3 1 3 3   Tired, decreased energy 0 3 1 3 3   Change in appetite 0 1 0 0 1  Feeling bad or failure about yourself  0 2 1 3 3   Trouble concentrating 0 2 1 0 1  Moving slowly or fidgety/restless 0 2 1 2 2   Suicidal thoughts 0 0 0 1 2  PHQ-9 Score 0 17 9 17 20   Difficult doing work/chores Not difficult at all Somewhat difficult Somewhat difficult Very difficult Extremely dIfficult   PHQ-2/9 Result reviewed  Fall Risk: Fall Risk  03/16/2020 05/22/2019 04/29/2019 12/24/2018 12/02/2018  Falls in the past year? 0 0 1 0 0  Number falls in past yr: 0 0 1 0 0  Injury with Fall? 0 0 0 0 0  Risk for fall due to : - - Other (Comment) - -  Follow up - Falls evaluation completed - Falls evaluation completed Falls evaluation completed     Assessment & Plan  1. Viral upper respiratory tract infection 2. Cough  Discussed with patient that symptoms seem consistent with an upper respiratory infection, likely viral.  Cannot exclude Covid as a potential source, and she has not had the vaccine. Given her symptoms, did recommend a Covid test, and she will get one at a local pharmacy like [facility] as can get the result in an expedient manner which is helpful.  If she does get tested and the result is positive, it was recommended to contact us and let us know.  Recommended closely monitoring, and if has increased chest congestion, more discolored mucus, or concerning symptoms arising like higher fevers, any shortness of breath, he needs to be seen more urgently and she was understanding of that.  She notes the DayQuil  and NyQuil products have been a little helpful, and can continue.  Also can use ibuprofen products with food as needed in the short-term (initially had told her Tylenol products would be okay as well, although noted that DayQuil and NyQuil have acetaminophen in them, and called her back and let her know not to take Tylenol with these entities, but can use ibuprofen in combination)  Rest and increased fluids Recommended staying isolated as she is doing, until her symptoms are resolving, and if her Covid test returns positive, may have further recommendations with contact tracing and isolation in  addition.  We will follow up again if symptoms are not improving or more problematic  I discussed the assessment and treatment plan with the patient. The patient was provided an opportunity to ask questions and all were answered. The patient agreed with the plan and demonstrated an understanding of the instructions.  Red flags and when to present for emergency care or RTC as noted above   The patient was advised to call back or seek an in-person evaluation if the symptoms worsen or if the condition fails to improve as anticipated.  I provided 15 minutes of non-face-to-face time during this encounter that included discussing at length patient's sx/history, pertinent pmhx, medications, treatment and follow up plan. This time also included the necessary documentation, orders, and chart review.  Towanda Malkin, MD

## 2020-03-17 DIAGNOSIS — F3181 Bipolar II disorder: Secondary | ICD-10-CM | POA: Diagnosis not present

## 2020-03-17 DIAGNOSIS — F1210 Cannabis abuse, uncomplicated: Secondary | ICD-10-CM | POA: Diagnosis not present

## 2020-03-17 DIAGNOSIS — F5105 Insomnia due to other mental disorder: Secondary | ICD-10-CM | POA: Diagnosis not present

## 2020-03-17 DIAGNOSIS — F603 Borderline personality disorder: Secondary | ICD-10-CM | POA: Diagnosis not present

## 2020-03-17 NOTE — Telephone Encounter (Signed)
Notified [place] [facility] of report of pt's. positive COVID test.

## 2020-03-22 DIAGNOSIS — Z20822 Contact with and (suspected) exposure to covid-19: Secondary | ICD-10-CM | POA: Diagnosis not present

## 2020-03-22 DIAGNOSIS — U071 COVID-19: Secondary | ICD-10-CM | POA: Diagnosis not present

## 2020-03-24 ENCOUNTER — Encounter: Payer: Self-pay | Admitting: Internal Medicine

## 2020-03-24 DIAGNOSIS — F603 Borderline personality disorder: Secondary | ICD-10-CM | POA: Diagnosis not present

## 2020-03-24 DIAGNOSIS — F331 Major depressive disorder, recurrent, moderate: Secondary | ICD-10-CM | POA: Diagnosis not present

## 2020-03-27 DIAGNOSIS — Z20822 Contact with and (suspected) exposure to covid-19: Secondary | ICD-10-CM | POA: Diagnosis not present

## 2020-03-27 DIAGNOSIS — Z03818 Encounter for observation for suspected exposure to other biological agents ruled out: Secondary | ICD-10-CM | POA: Diagnosis not present

## 2020-03-31 DIAGNOSIS — F603 Borderline personality disorder: Secondary | ICD-10-CM | POA: Diagnosis not present

## 2020-03-31 DIAGNOSIS — F331 Major depressive disorder, recurrent, moderate: Secondary | ICD-10-CM | POA: Diagnosis not present

## 2020-04-09 ENCOUNTER — Encounter: Payer: Self-pay | Admitting: Internal Medicine

## 2020-04-14 DIAGNOSIS — F331 Major depressive disorder, recurrent, moderate: Secondary | ICD-10-CM | POA: Diagnosis not present

## 2020-04-14 DIAGNOSIS — F603 Borderline personality disorder: Secondary | ICD-10-CM | POA: Diagnosis not present

## 2020-05-12 DIAGNOSIS — F331 Major depressive disorder, recurrent, moderate: Secondary | ICD-10-CM | POA: Diagnosis not present

## 2020-05-12 DIAGNOSIS — F603 Borderline personality disorder: Secondary | ICD-10-CM | POA: Diagnosis not present

## 2020-05-29 IMAGING — US US PELVIS COMPLETE WITH TRANSVAGINAL
2 series · 13 of 25 positions shown · non-contrast
Comparison: Prior ultrasound from 12/25/2018.

CLINICAL DATA: Initial evaluation for chronic pelvic pain.



[Series 1: us pelvis complete with transvaginal · 132 acquisitions, 12 frames shown (1 of 2)]
[im 1/132]
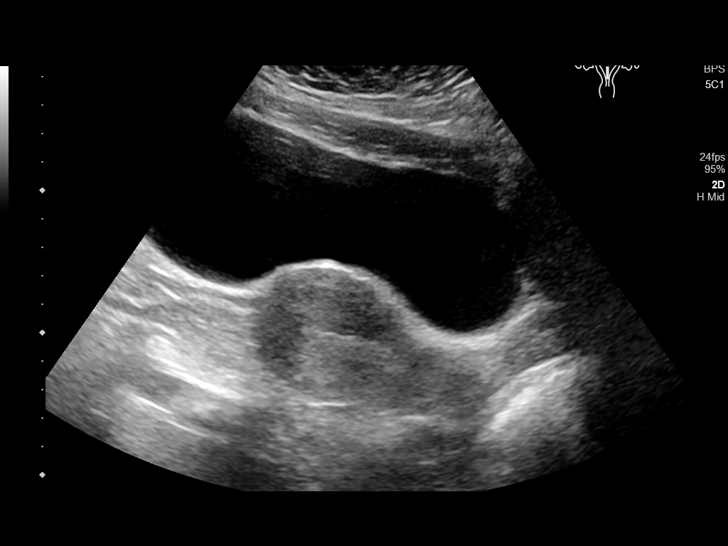
[im 12/132]
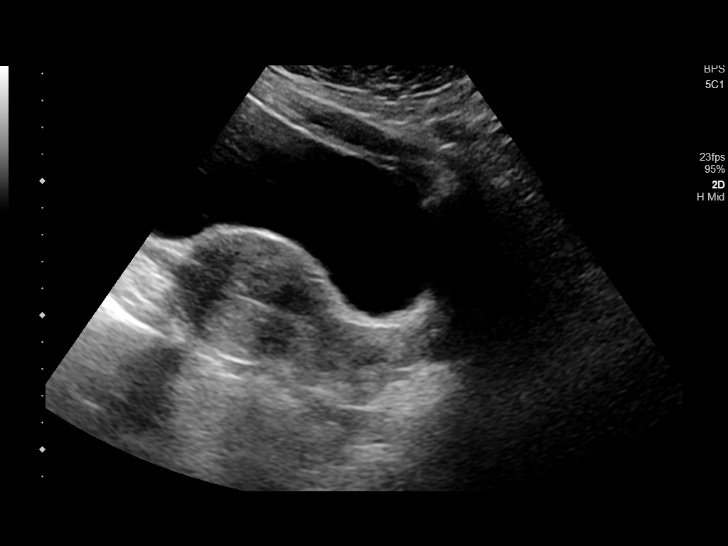
[im 23/132]
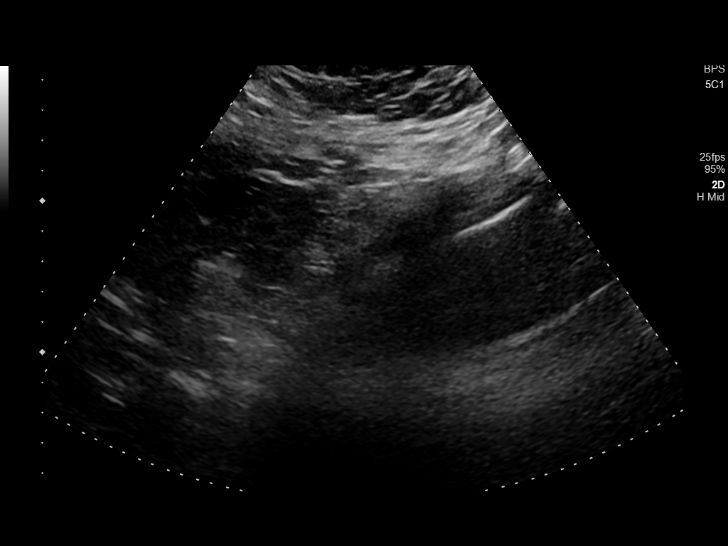
[im 35/132]
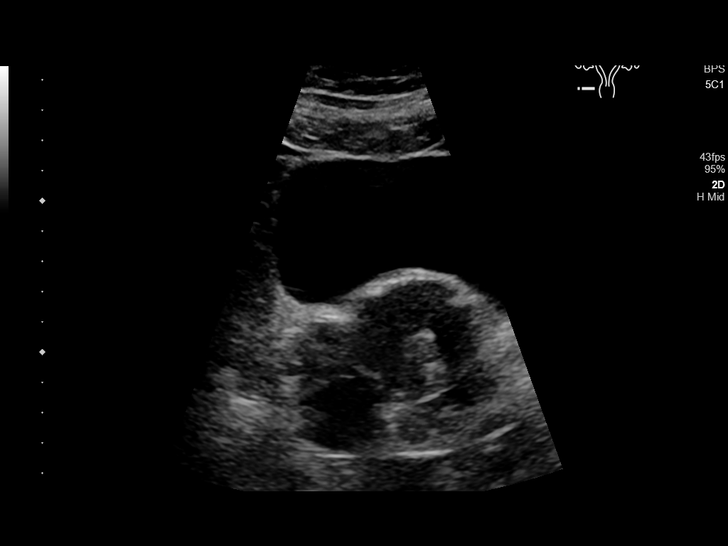
[im 46/132]
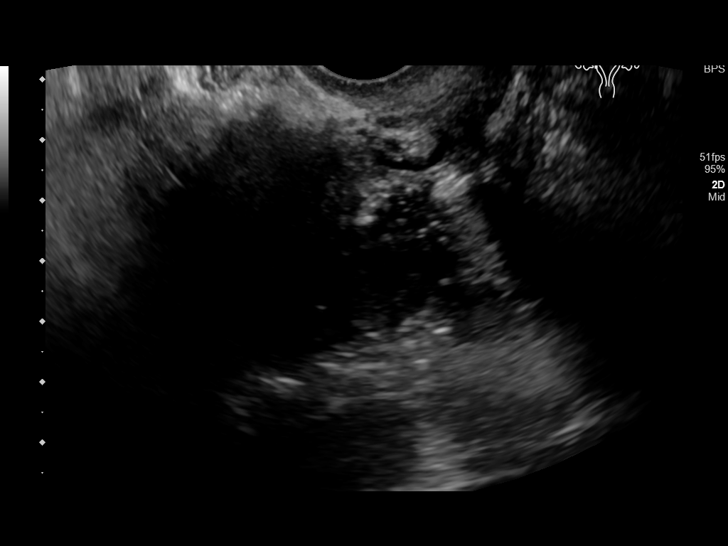
[im 57/132]
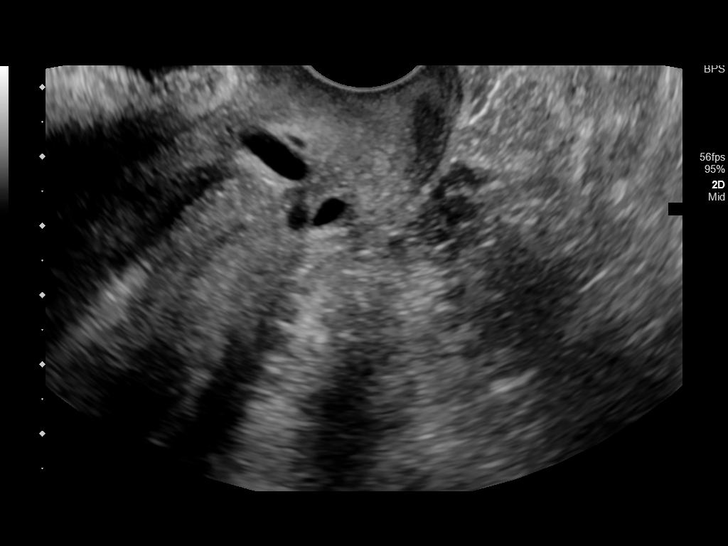
[im 69/132]
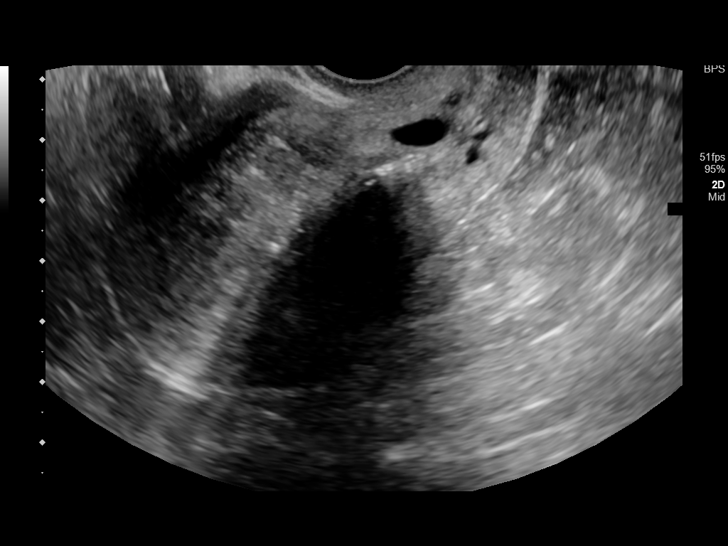
[im 80/132]
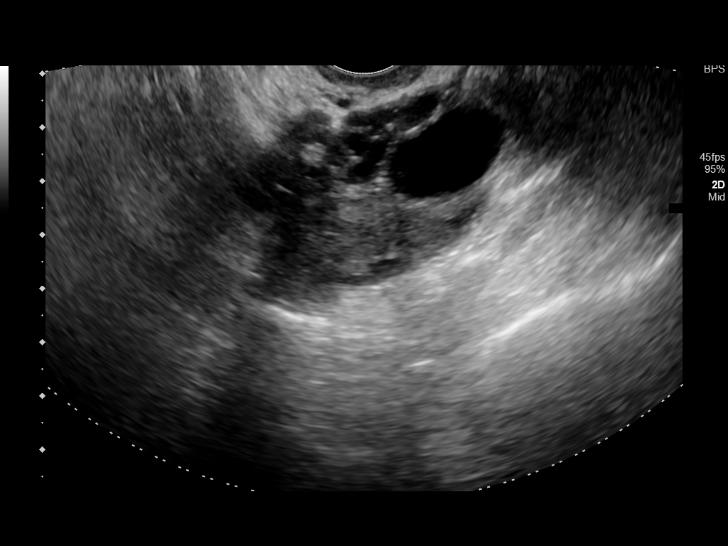
[im 92/132]
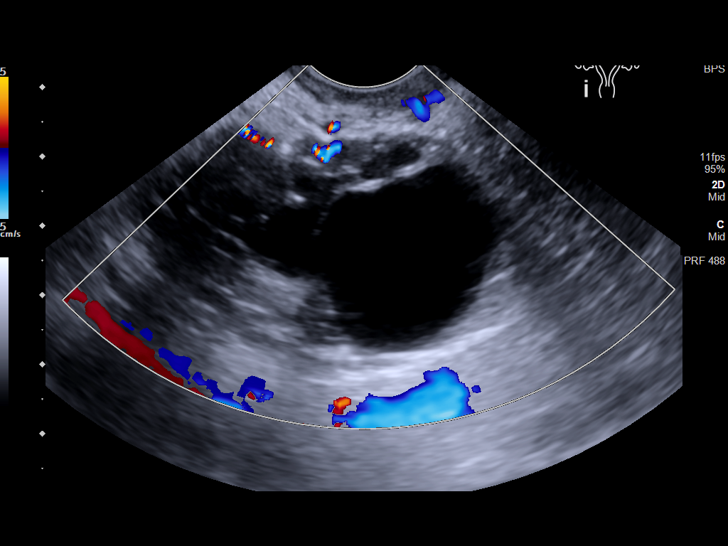
[im 103/132]
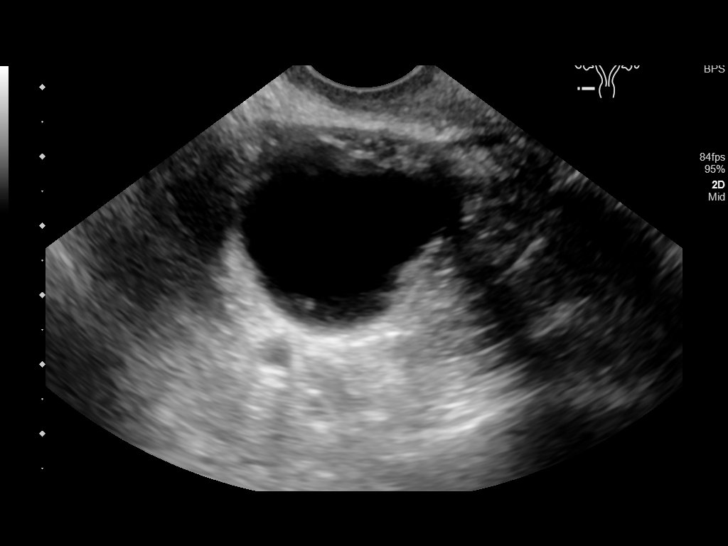
[im 114/132]
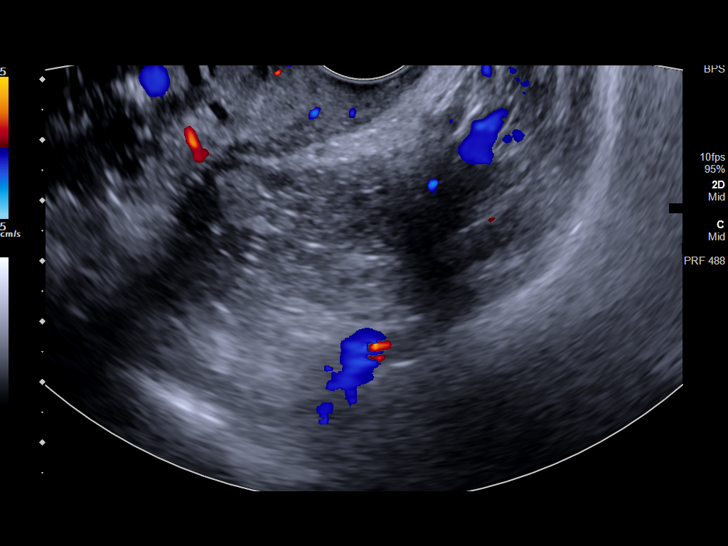
[im 126/132]
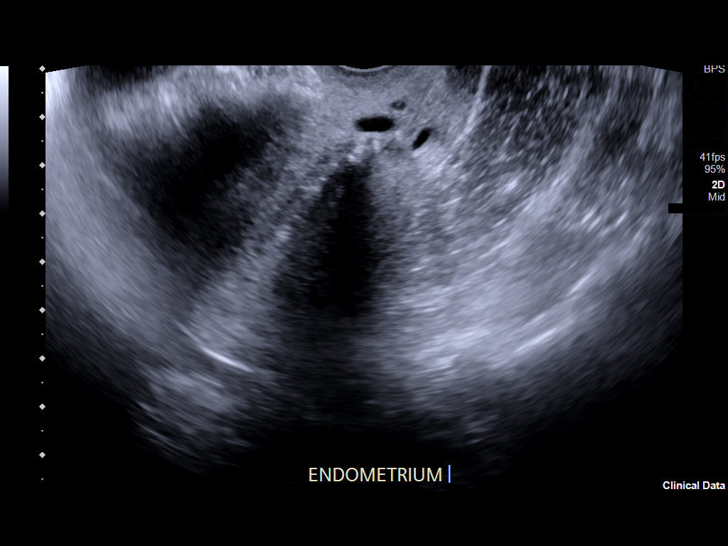

[Series 3: us pelvis complete with transvaginal · 1 of 2 slices shown (2 of 2)]
[im 1/2]
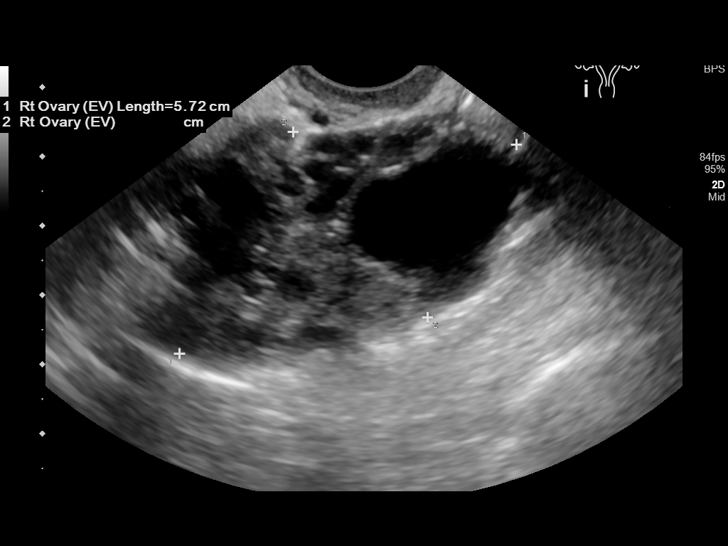

[13 of 25 positions shown; findings below may reference images not displayed]

FINDINGS: Uterus

Measurements: 8.8 x 4.6 x 5.1 cm = volume: 115.4 mL. No fibroids or
other mass visualized. Few scattered nabothian cysts noted about the
cervix.

Endometrium

Thickness: 4.8 mm. No focal abnormality visualized. IUD in
appropriate position within the endometrial cavity.

Right ovary

Measurements: 5.7 x 3.3 x 3.0 cm = volume: 29.8 mL. Normal
appearance/no adnexal mass. 2.9 cm simple cyst, most consistent with
a normal physiologic follicular cyst.

Left ovary

Measurements: 3.8 x 2.2 x 2.0 cm = volume: 8.5 mL. Normal
appearance/no adnexal mass.

Other findings

No abnormal free fluid.
IMPRESSION: 1. 2.9 cm simple right ovarian cyst, most consistent with a normal
physiologic follicular cyst. This has benign characteristics and is
a common finding in premenopausal females. No imaging follow up is
required. This follows consensus guidelines: Simple Adnexal Cysts:
SRU Consensus Conference Update on Follow-up and Reporting.
2. IUD in appropriate position within the endometrial cavity.
3. Otherwise unremarkable and normal pelvic ultrasound. No other
acute abnormality identified.

## 2020-06-04 DIAGNOSIS — F603 Borderline personality disorder: Secondary | ICD-10-CM | POA: Diagnosis not present

## 2020-06-04 DIAGNOSIS — F411 Generalized anxiety disorder: Secondary | ICD-10-CM | POA: Diagnosis not present

## 2020-06-04 DIAGNOSIS — F322 Major depressive disorder, single episode, severe without psychotic features: Secondary | ICD-10-CM | POA: Diagnosis not present

## 2020-06-16 DIAGNOSIS — F3181 Bipolar II disorder: Secondary | ICD-10-CM | POA: Diagnosis not present

## 2020-06-16 DIAGNOSIS — F331 Major depressive disorder, recurrent, moderate: Secondary | ICD-10-CM | POA: Diagnosis not present

## 2020-06-16 DIAGNOSIS — F603 Borderline personality disorder: Secondary | ICD-10-CM | POA: Diagnosis not present

## 2020-06-16 DIAGNOSIS — F1210 Cannabis abuse, uncomplicated: Secondary | ICD-10-CM | POA: Diagnosis not present

## 2020-06-16 DIAGNOSIS — F5105 Insomnia due to other mental disorder: Secondary | ICD-10-CM | POA: Diagnosis not present

## 2020-06-18 ENCOUNTER — Encounter: Payer: Self-pay | Admitting: Internal Medicine

## 2020-07-30 DIAGNOSIS — F322 Major depressive disorder, single episode, severe without psychotic features: Secondary | ICD-10-CM | POA: Diagnosis not present

## 2020-07-30 DIAGNOSIS — F411 Generalized anxiety disorder: Secondary | ICD-10-CM | POA: Diagnosis not present

## 2020-07-30 DIAGNOSIS — F603 Borderline personality disorder: Secondary | ICD-10-CM | POA: Diagnosis not present

## 2020-09-01 DIAGNOSIS — F331 Major depressive disorder, recurrent, moderate: Secondary | ICD-10-CM | POA: Diagnosis not present

## 2020-09-01 DIAGNOSIS — F603 Borderline personality disorder: Secondary | ICD-10-CM | POA: Diagnosis not present

## 2020-09-01 DIAGNOSIS — F4312 Post-traumatic stress disorder, chronic: Secondary | ICD-10-CM | POA: Diagnosis not present

## 2020-09-17 ENCOUNTER — Other Ambulatory Visit: Payer: Self-pay

## 2020-09-17 ENCOUNTER — Emergency Department
Admission: EM | Admit: 2020-09-17 | Discharge: 2020-09-18 | Disposition: A | Discharge status: Home / Self Care | Payer: BC Managed Care – PPO | Attending: Emergency Medicine | Admitting: Emergency Medicine

## 2020-09-17 ENCOUNTER — Encounter: Payer: Self-pay | Admitting: Emergency Medicine

## 2020-09-17 DIAGNOSIS — M545 Low back pain, unspecified: Secondary | ICD-10-CM | POA: Diagnosis not present

## 2020-09-17 DIAGNOSIS — I10 Essential (primary) hypertension: Secondary | ICD-10-CM | POA: Diagnosis not present

## 2020-09-17 DIAGNOSIS — Z87891 Personal history of nicotine dependence: Secondary | ICD-10-CM | POA: Diagnosis not present

## 2020-09-17 DIAGNOSIS — R079 Chest pain, unspecified: Secondary | ICD-10-CM | POA: Diagnosis not present

## 2020-09-17 DIAGNOSIS — F1210 Cannabis abuse, uncomplicated: Secondary | ICD-10-CM | POA: Diagnosis not present

## 2020-09-17 DIAGNOSIS — F603 Borderline personality disorder: Secondary | ICD-10-CM | POA: Diagnosis not present

## 2020-09-17 DIAGNOSIS — F5105 Insomnia due to other mental disorder: Secondary | ICD-10-CM | POA: Diagnosis not present

## 2020-09-17 DIAGNOSIS — F3181 Bipolar II disorder: Secondary | ICD-10-CM | POA: Diagnosis not present

## 2020-09-17 DIAGNOSIS — R030 Elevated blood-pressure reading, without diagnosis of hypertension: Secondary | ICD-10-CM | POA: Diagnosis not present

## 2020-09-17 LAB — CBC
HCT: 44.1 % (ref 36.0–46.0)
Hemoglobin: 14.2 g/dL (ref 12.0–15.0)
MCH: 26.8 pg (ref 26.0–34.0)
MCHC: 32.2 g/dL (ref 30.0–36.0)
MCV: 83.4 fL (ref 80.0–100.0)
Platelets: 312 10*3/uL (ref 150–400)
RBC: 5.29 MIL/uL — ABNORMAL HIGH (ref 3.87–5.11)
RDW: 14 % (ref 11.5–15.5)
WBC: 9.1 10*3/uL (ref 4.0–10.5)
nRBC: 0 % (ref 0.0–0.2)

## 2020-09-17 LAB — BASIC METABOLIC PANEL
Anion gap: 13 (ref 5–15)
BUN: 12 mg/dL (ref 6–20)
CO2: 24 mmol/L (ref 22–32)
Calcium: 9.8 mg/dL (ref 8.9–10.3)
Chloride: 101 mmol/L (ref 98–111)
Creatinine, Ser: 0.72 mg/dL (ref 0.44–1.00)
GFR, Estimated: >60 mL/min (ref >60)
Glucose, Bld: 121 mg/dL — ABNORMAL HIGH (ref 70–99)
Potassium: 3.4 mmol/L — ABNORMAL LOW (ref 3.5–5.1)
Sodium: 138 mmol/L (ref 135–145)

## 2020-09-17 LAB — TROPONIN I (HIGH SENSITIVITY)
Troponin I (High Sensitivity): 6 ng/L (ref <18)
Troponin I (High Sensitivity): 7 ng/L (ref <18)

## 2020-09-17 MED ORDER — AMLODIPINE BESYLATE 5 MG PO TABS
10.0000 mg | ORAL_TABLET | Freq: Once | ORAL | Status: AC
  Administered 2020-09-18: 10 mg via ORAL
  Filled 2020-09-17: qty 2

## 2020-09-17 MED ORDER — IBUPROFEN 600 MG PO TABS
600.0000 mg | ORAL_TABLET | Freq: Once | ORAL | Status: AC
  Administered 2020-09-18: 600 mg via ORAL
  Filled 2020-09-17: qty 1

## 2020-09-17 NOTE — ED Triage Notes (Signed)
Pt to ED via POV with c/o HTN while at psychiatrists office. Pt states BP 196/109, pt c/o chest tightness, states thought she was having an anxiety attack most of the day. Pt A&O x4, NAD noted at this time. Pt denies hx of HTN.

## 2020-09-17 NOTE — ED Notes (Signed)
Patient able to ambulate to hallway bed without issues.  Patient states she is having chest tightness since Monday. Patient states "I thought it was just anxiety related but it does not go away with anxiety medication".

## 2020-09-18 ENCOUNTER — Emergency Department: Payer: BC Managed Care – PPO

## 2020-09-18 DIAGNOSIS — R079 Chest pain, unspecified: Secondary | ICD-10-CM | POA: Diagnosis not present

## 2020-09-18 MED ORDER — AMLODIPINE BESYLATE 10 MG PO TABS: 10.0000 mg | ORAL_TABLET | Freq: Every day | ORAL | 0 refills | Status: DC

## 2020-09-18 NOTE — ED Notes (Signed)
Consent for DC signed on hard copy and placed with chart.

## 2020-09-18 NOTE — ED Notes (Signed)
Patient in stretcher, patient appears to be feeling better than previously assessed. EDP informed of BP decreasing.

## 2020-09-18 NOTE — Discharge Instructions (Addendum)
Take amlodipine as prescribed. Keep a blood pressure diary and check your pressure twice a day. Follow up with PCP for further management. Return to the ER for chest pain, dizziness, passing out or feeling like you will pass out, severe headache, facial droop, slurred speech, unilateral weakness or numbness, changes in vision.

## 2020-09-18 NOTE — ED Provider Notes (Signed)
[facility] Emergency Department Provider Note  ____________________________________________  Time seen: Approximately 12:46 AM  I have reviewed the triage vital signs and the nursing notes.   HISTORY  Chief Complaint Hypertension   HPI Amanda Nash is a 35 y.o. female with a history of borderline personality disorder, bipolar, anxiety who presents for evaluation of elevated blood pressure.  Patient was at her psychiatrist's office earlier today when she was noted to have elevated blood pressure.  BP was checked several times and persisted to be elevated.  After going home patient checked again her blood pressure was still elevated which prompted her visit to the emergency room.  Patient reports that this whole week she has been having episodes of chest tightness which she attributed to anxiety attacks.  She denies any changes in her medications.  Has recently received her Covid booster less than 10 days ago.  Currently complaining of mild chest tightness.  No dizziness, no shortness of breath, no headache, no changes in vision, no dysarthria, no dysphagia, no difficulty finding words, no gait instability, no vertigo, no unilateral weakness or numbness..  She is also complaining of mild low back pain which started after being sitting in the waiting room for 7 hours.  No saddle anesthesia, no trauma, no fever, no lower extremity weakness or numbness, urinary or bowel incontinence or retention.  Past Medical History:  Diagnosis Date  . Anxiety   . Bipolar 1 disorder ([place]) 2016  . Borderline personality disorder ([place]) 2017  . Depression     Patient Active Problem List   Diagnosis Date Noted  . Chronic pelvic pain in female 02/26/2019  . Prediabetes 12/24/2018  . Thrombocytosis 12/24/2018  . Anemia 12/24/2018  . Bipolar I disorder ([place]) 12/02/2018  . Dysmenorrhea 12/02/2018  . Menorrhagia with irregular cycle 12/02/2018  . Morbid obesity ([address]) 12/02/2018     Past Surgical History:  Procedure Laterality Date  . DILATATION & CURRETTAGE/HYSTEROSCOPY WITH RESECTOCOPE  2011    Prior to Admission medications   Medication Sig Start Date End Date Taking? Authorizing Provider  amLODipine (NORVASC) 10 MG tablet Take 1 tablet (10 mg total) by mouth daily. 09/18/20 09/18/21 Yes Zylie Mumaw, [place], MD  carbamazepine (TEGRETOL) 200 MG tablet 200 mg 2 (two) times daily.  12/30/18   [provider]  citalopram (CELEXA) 10 MG tablet Take 10 mg by mouth every morning. 12/17/19   [provider]  lansoprazole (PREVACID) 30 MG capsule Take 30 mg by mouth daily at 12 noon.    [provider]  levonorgestrel (MIRENA) 20 MCG/24HR IUD 1 Intra Uterine Device (1 each total) by Intrauterine route once for 1 dose. 01/02/19 03/16/20  Will Bonnet, MD  OLANZapine (ZYPREXA) 2.5 MG tablet Take by mouth. 03/07/20   [provider]    Allergies Patient has no known allergies.  Family History  Problem Relation Age of Onset  . Depression Mother   . Anxiety disorder Mother   . Heart disease Mother   . Bipolar disorder Father   . Hypertension Father   . Diabetes Father   . Depression Brother   . Anxiety disorder Brother     Social History Social History   Tobacco Use  . Smoking status: Former Smoker    Quit date: 01/31/2005    Years since quitting: 15.6  . Smokeless tobacco: Never Used  Vaping Use  . Vaping Use: Former  Substance Use Topics  . Alcohol use: Yes    Comment: rarely  .  Drug use: Yes    Types: Marijuana    Review of Systems  Constitutional: Negative for fever. Eyes: Negative for visual changes. ENT: Negative for sore throat. Neck: No neck pain  Cardiovascular: Negative for chest pain. + Chest tightness Respiratory: Negative for shortness of breath. Gastrointestinal: Negative for abdominal pain, vomiting or diarrhea. Genitourinary: Negative for dysuria. Musculoskeletal: + back pain. Skin: Negative for  rash. Neurological: Negative for headaches, weakness or numbness. Psych: No SI or HI  ____________________________________________   PHYSICAL EXAM:  VITAL SIGNS: ED Triage Vitals  Enc Vitals Group     BP 09/17/20 1757 (!) 193/105     Pulse Rate 09/17/20 1757 67     Resp 09/17/20 1757 20     Temp 09/17/20 1757 98.7 F (37.1 C)     Temp Source 09/17/20 1757 Oral     SpO2 09/17/20 1757 99 %     Weight 09/17/20 1755 286 lb (129.7 kg)     Height 09/17/20 1755 5\' 2"  (1.575 m)     Head Circumference --      Peak Flow --      Pain Score 09/17/20 1755 4     Pain Loc --      Pain Edu? --      Excl. in [place]? --     Constitutional: Alert and oriented. Well appearing and in no apparent distress. HEENT:      Head: Normocephalic and atraumatic.         Eyes: Conjunctivae are normal. Sclera is non-icteric.       Mouth/Throat: Mucous membranes are moist.       Neck: Supple with no signs of meningismus. Cardiovascular: Regular rate and rhythm. No murmurs, gallops, or rubs. 2+ symmetrical distal pulses are present in all extremities. Respiratory: Normal respiratory effort. Lungs are clear to auscultation bilaterally. Gastrointestinal: Soft, non tender, and non distended with positive bowel sounds. No rebound or guarding. Genitourinary: No CVA tenderness. Musculoskeletal:  No edema, cyanosis, or erythema of extremities. No c, t, l spine tenderness Neurologic: Normal speech and language. Face is symmetric. Moving all extremities. No gross focal neurologic deficits are appreciated. Skin: Skin is warm, dry and intact. No rash noted. Psychiatric: Mood and affect are normal. Speech and behavior are normal.  ____________________________________________   LABS (all labs ordered are listed, but only abnormal results are displayed)  Labs Reviewed  BASIC METABOLIC PANEL - Abnormal; Notable for the following components:      Result Value   Potassium 3.4 (*)    Glucose, Bld 121 (*)    All other  components within normal limits  CBC - Abnormal; Notable for the following components:   RBC 5.29 (*)    All other components within normal limits  POC URINE PREG, ED  TROPONIN I (HIGH SENSITIVITY)  TROPONIN I (HIGH SENSITIVITY)   ____________________________________________  EKG  ED ECG REPORT I, Rudene Re, the attending physician, personally viewed and interpreted this ECG.  17:56 - Normal sinus rhythm, rate of 65, normal intervals, normal axis, T wave inversions in inferior leads with no ST elevation.  No prior for comparison.  00:07 -normal sinus rhythm, rate of 62, normal intervals, normal axis, unchanged T wave inversions when compared to prior ____________________________________________  RADIOLOGY  I have personally reviewed the images performed during this visit and I agree with the Radiologist's read.   Interpretation by Radiologist:  DG Chest 2 View  Result Date: 09/18/2020 CLINICAL DATA:  Chest pain.  Elevated blood pressure. EXAM:  CHEST - 2 VIEW COMPARISON:  None. FINDINGS: Heart is normal in size.The cardiomediastinal contours are normal. The lungs are clear. Pulmonary vasculature is normal. No consolidation, pleural effusion, or pneumothorax. No acute osseous abnormalities are seen. IMPRESSION: Negative radiographs of the chest. Electronically Signed   By: Narda Rutherford M.D.   On: 09/18/2020 01:53      ____________________________________________   PROCEDURES  Procedure(s) performed:yes .1-3 Lead EKG Interpretation Performed by: Nita Sickle, MD Authorized by: Nita Sickle, MD     Interpretation: non-specific     ECG rate assessment: normal     Rhythm: sinus rhythm     Ectopy: none     Critical Care performed:  None ____________________________________________   INITIAL IMPRESSION / ASSESSMENT AND PLAN / ED COURSE  35 y.o. female with a history of borderline personality disorder, bipolar, anxiety who presents for evaluation of  elevated blood pressure from her psychiatrist office associated with chest tightness.  Patient is well-appearing in no distress, does not look anxious on exam.  Blood pressure here persistently elevated with systolics in the 180s.  Review of epic shows the patient's blood pressure prior visits have been normal with systolics in the 120s although the last visit noted in her chart is from a year ago.  Patient denies any known history of hypertension but does have family history of such.  No personal or family history of heart disease patient is a former smoker.  No new medications although did receive a Covid booster less than 10 days ago.  She is denying pleuritic chest pain or shortness of breath, no hypoxia, tachypnea, or tachycardia.  Initial EKG showing inferior T wave inversions with no old for comparison.  Therefore repeat EKG was done which was unchanged from prior.  2 high-sensitivity troponin is negative for any signs of ischemia.  Low risk per heart score.  PERC negative.  Chest x-ray with no evidence of edema, widened mediastinum, or any other abnormalities, visualized by me and confirmed by radiology.  Labs reviewed with no endorgan dysfunction.  Ddx primary HTN, anxiety, covid vaccine side effect.  Will start patient on amlodipine, recommend BP diary at home for monitoring, close f/u with PCP for management of BP. Discussed my standard return precautions for chest pain, shortness of breath, dizziness, thunderclap headache, or any signs of stroke.  Patient reports that after being 7 hours sitting outside in the waiting room she has mild soreness in her lower back.  Exam is completely benign with no bony tenderness, completely neurologically intact.  Most likely just muscle spasms.  She was given ibuprofen for that.  _________________________ 1:55 AM on 09/18/2020 -----------------------------------------  BP trending down. Patient's chest tightness resolved. Will dc home on amlodipine,  follow-up with primary care doctor.  Plan discussed with patient and family member who is at bedside.    _____________________________________________ Please note:  Patient was evaluated in Emergency Department today for the symptoms described in the history of present illness. Patient was evaluated in the context of the global COVID-19 pandemic, which necessitated consideration that the patient might be at risk for infection with the SARS-CoV-2 virus that causes COVID-19. Institutional protocols and algorithms that pertain to the evaluation of patients at risk for COVID-19 are in a state of rapid change based on information released by regulatory bodies including the CDC and federal and state organizations. These policies and algorithms were followed during the patient's care in the ED.  Some ED evaluations and interventions may be delayed as a result of  limited staffing during the pandemic.   [place] Controlled Substance Database was reviewed by me. ____________________________________________   FINAL CLINICAL IMPRESSION(S) / ED DIAGNOSES   Final diagnoses:  Hypertension, unspecified type      NEW MEDICATIONS STARTED DURING THIS VISIT:  ED Discharge Orders         Ordered    amLODipine (NORVASC) 10 MG tablet  Daily        09/18/20 0155           Note:  This document was prepared using Dragon voice recognition software and may include unintentional dictation errors.    Alfred Levins, [place], MD 09/18/20 [phone]

## 2020-09-21 ENCOUNTER — Other Ambulatory Visit: Payer: Self-pay

## 2020-09-22 ENCOUNTER — Ambulatory Visit: Payer: BC Managed Care – PPO | Admitting: Family Medicine

## 2020-09-22 ENCOUNTER — Encounter: Payer: Self-pay | Admitting: Family Medicine

## 2020-09-22 VITALS — BP 176/104 | HR 72 | Temp 98.5°F | Ht 63.0 in | Wt 272.2 lb

## 2020-09-22 DIAGNOSIS — I10 Essential (primary) hypertension: Secondary | ICD-10-CM

## 2020-09-22 MED ORDER — HYDROCHLOROTHIAZIDE 25 MG PO TABS
25.0000 mg | ORAL_TABLET | Freq: Every day | ORAL | 3 refills | Status: DC
Start: 2020-09-22 — End: 2020-10-06

## 2020-09-22 NOTE — Progress Notes (Signed)
Amanda Nash is a 35 y.o. female  Chief Complaint  Patient presents with  . Establish Care    NP- c/o having elevated BP and went to the hospital on 09/18/19.  Average BP  185/110.      HPI: Amanda Nash is a 35 y.o. female seen today as a new patient to establish care with our office and to f/u on newly diagnosed HTN. She was seen in ER at [facility] on 09/18/19 and started on amlodipine 10mg  daily. Pt has been having headaches, "thudding heart", and discomfort in chest.  BP cuff at home (automatic) with readings average 185/110.   She had covid vaccines on 08/18/20 and 09/08/20.  BP was normal in 06/2020 when checked by psych.  Diet: no added salt in diet and in the past couple weeks pt has been following low sodium and low carb diet; drinks mainly water  Fam h/o HTN in mother and father. Mother's HTN developed around 70yo as well.   Past Medical History:  Diagnosis Date  . Anxiety   . Bipolar 1 disorder ([place]) 2016  . Borderline personality disorder ([place]) 2017  . Depression     Past Surgical History:  Procedure Laterality Date  . DILATATION & CURRETTAGE/HYSTEROSCOPY WITH RESECTOCOPE  2011    Social History   Socioeconomic History  . Marital status: Married    Spouse name: Shela Nevin  . Number of children: Not on file  . Years of education: Not on file  . Highest education level: Not on file  Occupational History  . Occupation: Systems developer    Comment: [employer]  Tobacco Use  . Smoking status: Former Smoker    Quit date: 01/31/2005    Years since quitting: 15.6  . Smokeless tobacco: Never Used  Vaping Use  . Vaping Use: Former  Substance and Sexual Activity  . Alcohol use: Yes    Comment: rarely  . Drug use: Yes    Types: Marijuana  . Sexual activity: Yes    Partners: Male    Birth control/protection: None  Other Topics Concern  . Not on file  Social History Narrative   Live with husband and her brother, has 2 dogs and a bunny at home. Has stepdaughter  that does not live with them.   Social Determinants of Health   Financial Resource Strain: Not on file  Food Insecurity: Not on file  Transportation Needs: Not on file  Physical Activity: Not on file  Stress: Not on file  Social Connections: Not on file  Intimate Partner Violence: Not on file    Family History  Problem Relation Age of Onset  . Depression Mother   . Anxiety disorder Mother   . Heart disease Mother   . Bipolar disorder Father   . Hypertension Father   . Diabetes Father   . Depression Brother   . Anxiety disorder Brother      Immunization History  Administered Date(s) Administered  . Influenza-Unspecified 06/14/2020  . PFIZER SARS-COV-2 Vaccination 08/18/2020, 09/08/2020  . Tdap 12/02/2018    Outpatient Encounter Medications as of 09/22/2020  Medication Sig  . amLODipine (NORVASC) 10 MG tablet Take 1 tablet (10 mg total) by mouth daily.  . carbamazepine (TEGRETOL) 200 MG tablet 200 mg 2 (two) times daily.   . citalopram (CELEXA) 10 MG tablet Take 20 mg by mouth every morning.  . lansoprazole (PREVACID) 30 MG capsule Take 30 mg by mouth daily at 12 noon.  Marland Kitchen levonorgestrel (MIRENA) 20 MCG/24HR IUD  1 Intra Uterine Device (1 each total) by Intrauterine route once for 1 dose.  Marland Kitchen OLANZapine (ZYPREXA) 2.5 MG tablet Take 2.5 mg by mouth daily. 3 tabs daily   No facility-administered encounter medications on file as of 09/22/2020.     ROS: Pertinent positives and negatives noted in HPI. Remainder of ROS non-contributory    No Known Allergies  BP (!) 176/104   Pulse 72   Temp 98.5 F (36.9 C) (Temporal)   Ht 5\' 3"  (1.6 m)   Wt 272 lb 3.2 oz (123.5 kg)   SpO2 97%   BMI 48.22 kg/m    BP Readings from Last 3 Encounters:  09/22/20 (!) 176/104  09/18/20 (!) 184/87  05/22/19 118/80   Pulse Readings from Last 3 Encounters:  09/22/20 72  09/18/20 60  05/22/19 93   Wt Readings from Last 3 Encounters:  09/22/20 272 lb 3.2 oz (123.5 kg)  09/17/20 286  lb (129.7 kg)  03/16/20 270 lb (122.5 kg)   Physical Exam Constitutional:      General: She is not in acute distress.    Appearance: She is obese. She is not ill-appearing.  Cardiovascular:     Rate and Rhythm: Normal rate and regular rhythm.     Pulses: Normal pulses.  Pulmonary:     Effort: No respiratory distress.  Musculoskeletal:     Right lower leg: No edema.     Left lower leg: No edema.  Neurological:     General: No focal deficit present.     Mental Status: She is alert and oriented to person, place, and time.  Psychiatric:        Mood and Affect: Mood normal.      A/P:  1. Primary hypertension - uncontrolled on amlodipine 10mg  daily Add: - hydrochlorothiazide (HYDRODIURIL) 25 MG tablet; Take 1 tablet (25 mg total) by mouth daily.  Dispense: 90 tablet; Refill: 3 - cont to check BP daily and keep log - reviewed DASH diet and included info in AVS - f/u in 2 wks or sooner PRN   This visit occurred during the SARS-CoV-2 public health emergency.  Safety protocols were in place, including screening questions prior to the visit, additional usage of staff PPE, and extensive cleaning of exam room while observing appropriate contact time as indicated for disinfecting solutions.

## 2020-09-22 NOTE — Patient Instructions (Addendum)
continue on amlodipine 10mg  daily Add HCTZ 25mg  1 tab daily  Low sodium diet - see below Regular walking/cardiovascular exercise   DASH Eating Plan DASH stands for Dietary Approaches to Stop Hypertension. The DASH eating plan is a healthy eating plan that has been shown to:  Reduce high blood pressure (hypertension).  Reduce your risk for type 2 diabetes, heart disease, and stroke.  Help with weight loss. What are tips for following this plan? Reading food labels  Check food labels for the amount of salt (sodium) per serving. Choose foods with less than 5 percent of the Daily Value of sodium. Generally, foods with less than 300 milligrams (mg) of sodium per serving fit into this eating plan.  To find whole grains, look for the word "whole" as the first word in the ingredient list. Shopping  Buy products labeled as "low-sodium" or "no salt added."  Buy fresh foods. Avoid canned foods and pre-made or frozen meals. Cooking  Avoid adding salt when cooking. Use salt-free seasonings or herbs instead of table salt or sea salt. Check with your health care provider or pharmacist before using salt substitutes.  Do not fry foods. Cook foods using healthy methods such as baking, boiling, grilling, roasting, and broiling instead.  Cook with heart-healthy oils, such as olive, canola, avocado, soybean, or sunflower oil. Meal planning  Eat a balanced diet that includes: ? 4 or more servings of fruits and 4 or more servings of vegetables each day. Try to fill one-half of your plate with fruits and vegetables. ? 6-8 servings of whole grains each day. ? Less than 6 oz (170 g) of lean meat, poultry, or fish each day. A 3-oz (85-g) serving of meat is about the same size as a deck of cards. One egg equals 1 oz (28 g). ? 2-3 servings of low-fat dairy each day. One serving is 1 cup (237 mL). ? 1 serving of nuts, seeds, or beans 5 times each week. ? 2-3 servings of heart-healthy fats. Healthy fats  called omega-3 fatty acids are found in foods such as walnuts, flaxseeds, fortified milks, and eggs. These fats are also found in cold-water fish, such as sardines, salmon, and mackerel.  Limit how much you eat of: ? Canned or prepackaged foods. ? Food that is high in trans fat, such as some fried foods. ? Food that is high in saturated fat, such as fatty meat. ? Desserts and other sweets, sugary drinks, and other foods with added sugar. ? Full-fat dairy products.  Do not salt foods before eating.  Do not eat more than 4 egg yolks a week.  Try to eat at least 2 vegetarian meals a week.  Eat more home-cooked food and less restaurant, buffet, and fast food.   Lifestyle  When eating at a restaurant, ask that your food be prepared with less salt or no salt, if possible.  If you drink alcohol: ? Limit how much you use to:  0-1 drink a day for women who are not pregnant.  0-2 drinks a day for men. ? Be aware of how much alcohol is in your drink. In the [place] one drink equals one 12 oz bottle of beer (355 mL), one 5 oz glass of wine (148 mL), or one 1 oz glass of hard liquor (44 mL). General information  Avoid eating more than 2,300 mg of salt a day. If you have hypertension, you may need to reduce your sodium intake to 1,500 mg a day.  Work with  your health care provider to maintain a healthy body weight or to lose weight. Ask what an ideal weight is for you.  Get at least 30 minutes of exercise that causes your heart to beat faster (aerobic exercise) most days of the week. Activities may include walking, swimming, or biking.  Work with your health care provider or dietitian to adjust your eating plan to your individual calorie needs. What foods should I eat? Fruits All fresh, dried, or frozen fruit. Canned fruit in natural juice (without added sugar). Vegetables Fresh or frozen vegetables (raw, steamed, roasted, or grilled). Low-sodium or reduced-sodium tomato and vegetable juice.  Low-sodium or reduced-sodium tomato sauce and tomato paste. Low-sodium or reduced-sodium canned vegetables. Grains Whole-grain or whole-wheat bread. Whole-grain or whole-wheat pasta. Brown rice. [place]. Bulgur. Whole-grain and low-sodium cereals. Pita bread. Low-fat, low-sodium crackers. Whole-wheat flour tortillas. Meats and other proteins Skinless chicken or [place]. Ground chicken or [place]. Pork with fat trimmed off. Fish and seafood. Egg whites. Dried beans, peas, or lentils. Unsalted nuts, nut butters, and seeds. Unsalted canned beans. Lean cuts of beef with fat trimmed off. Low-sodium, lean precooked or cured meat, such as sausages or meat loaves. Dairy Low-fat (1%) or fat-free (skim) milk. Reduced-fat, low-fat, or fat-free cheeses. Nonfat, low-sodium ricotta or cottage cheese. Low-fat or nonfat yogurt. Low-fat, low-sodium cheese. Fats and oils Soft margarine without trans fats. Vegetable oil. Reduced-fat, low-fat, or light mayonnaise and salad dressings (reduced-sodium). Canola, safflower, olive, avocado, soybean, and sunflower oils. Avocado. Seasonings and condiments Herbs. Spices. Seasoning mixes without salt. Other foods Unsalted popcorn and pretzels. Fat-free sweets. The items listed above may not be a complete list of foods and beverages you can eat. Contact a dietitian for more information. What foods should I avoid? Fruits Canned fruit in a light or heavy syrup. Fried fruit. Fruit in cream or butter sauce. Vegetables Creamed or fried vegetables. Vegetables in a cheese sauce. Regular canned vegetables (not low-sodium or reduced-sodium). Regular canned tomato sauce and paste (not low-sodium or reduced-sodium). Regular tomato and vegetable juice (not low-sodium or reduced-sodium). Angie Fava. Olives. Grains Baked goods made with fat, such as croissants, muffins, or some breads. Dry pasta or rice meal packs. Meats and other proteins Fatty cuts of meat. Ribs. Fried meat. Berniece Salines.  Bologna, salami, and other precooked or cured meats, such as sausages or meat loaves. Fat from the back of a pig (fatback). Bratwurst. Salted nuts and seeds. Canned beans with added salt. Canned or smoked fish. Whole eggs or egg yolks. Chicken or [place] with skin. Dairy Whole or 2% milk, cream, and half-and-half. Whole or full-fat cream cheese. Whole-fat or sweetened yogurt. Full-fat cheese. Nondairy creamers. Whipped toppings. Processed cheese and cheese spreads. Fats and oils Butter. Stick margarine. Lard. Shortening. Ghee. Bacon fat. Tropical oils, such as coconut, palm kernel, or palm oil. Seasonings and condiments Onion salt, garlic salt, seasoned salt, table salt, and sea salt. Worcestershire sauce. Tartar sauce. Barbecue sauce. Teriyaki sauce. Soy sauce, including reduced-sodium. Steak sauce. Canned and packaged gravies. Fish sauce. Oyster sauce. Cocktail sauce. Store-bought horseradish. Ketchup. Mustard. Meat flavorings and tenderizers. Bouillon cubes. Hot sauces. Pre-made or packaged marinades. Pre-made or packaged taco seasonings. Relishes. Regular salad dressings. Other foods Salted popcorn and pretzels. The items listed above may not be a complete list of foods and beverages you should avoid. Contact a dietitian for more information. Where to find more information  National Heart, Lung, and Blood Institute: https://wilson-eaton.com/  American Heart Association: www.heart.org  Academy of Nutrition and Dietetics: www.eatright.org  [place]: www.kidney.org Summary  The DASH eating plan is a healthy eating plan that has been shown to reduce high blood pressure (hypertension). It may also reduce your risk for type 2 diabetes, heart disease, and stroke.  When on the DASH eating plan, aim to eat more fresh fruits and vegetables, whole grains, lean proteins, low-fat dairy, and heart-healthy fats.  With the DASH eating plan, you should limit salt (sodium) intake to 2,300 mg a  day. If you have hypertension, you may need to reduce your sodium intake to 1,500 mg a day.  Work with your health care provider or dietitian to adjust your eating plan to your individual calorie needs. This information is not intended to replace advice given to you by your health care provider. Make sure you discuss any questions you have with your health care provider. Document Revised: 08/01/2019 Document Reviewed: 08/01/2019 Elsevier Patient Education  2021 [employer].

## 2020-09-28 ENCOUNTER — Encounter: Payer: Self-pay | Admitting: Family Medicine

## 2020-10-06 ENCOUNTER — Ambulatory Visit: Payer: BC Managed Care – PPO | Admitting: Family Medicine

## 2020-10-06 ENCOUNTER — Encounter: Payer: Self-pay | Admitting: Family Medicine

## 2020-10-06 ENCOUNTER — Other Ambulatory Visit: Payer: Self-pay

## 2020-10-06 VITALS — BP 154/100 | HR 73 | Temp 98.7°F | Ht 63.0 in | Wt 271.4 lb

## 2020-10-06 DIAGNOSIS — I10 Essential (primary) hypertension: Secondary | ICD-10-CM

## 2020-10-06 MED ORDER — LOSARTAN POTASSIUM-HCTZ 100-25 MG PO TABS
1.0000 | ORAL_TABLET | Freq: Every day | ORAL | 1 refills | Status: DC
Start: 2020-10-06 — End: 2021-03-29

## 2020-10-06 NOTE — Progress Notes (Signed)
Chief Complaint  Patient presents with  . Follow-up    2 wee f/u BP. Average BP 150/97.      HPI: Amanda Nash is a 35 y.o. female here for HTN follow-up. Pt is taking amlodipine 10mg  daily and HCTZ 25mg  daily. She has been checking BP at home and average is 150/97.  No side effects. Tolerating ok.   BP Readings from Last 3 Encounters:  10/06/20 (!) 154/100  09/22/20 (!) 176/104  09/18/20 (!) 184/87   Lab Results  Component Value Date   CREATININE 0.72 09/17/2020   BUN 12 09/17/2020   NA 138 09/17/2020   K 3.4 (L) 09/17/2020   CL 101 09/17/2020   CO2 24 09/17/2020     Past Medical History:  Diagnosis Date  . Anxiety   . Bipolar 1 disorder ([place]) 2016  . Borderline personality disorder ([place]) 2017  . Depression     Past Surgical History:  Procedure Laterality Date  . DILATATION & CURRETTAGE/HYSTEROSCOPY WITH RESECTOCOPE  2011    Social History   Socioeconomic History  . Marital status: Married    Spouse name: Shela Nevin  . Number of children: Not on file  . Years of education: Not on file  . Highest education level: Not on file  Occupational History  . Occupation: Systems developer    Comment: [employer]  Tobacco Use  . Smoking status: Former Smoker    Quit date: 01/31/2005    Years since quitting: 15.6  . Smokeless tobacco: Never Used  Vaping Use  . Vaping Use: Former  Substance and Sexual Activity  . Alcohol use: Yes    Comment: rarely  . Drug use: Yes    Types: Marijuana  . Sexual activity: Yes    Partners: Male    Birth control/protection: None  Other Topics Concern  . Not on file  Social History Narrative   Live with husband and her brother, has 2 dogs and a bunny at home. Has stepdaughter that does not live with them.   Social Determinants of Health   Financial Resource Strain: Not on file  Food Insecurity: Not on file  Transportation Needs: Not on file  Physical Activity: Not on file  Stress: Not on file  Social Connections: Not on  file  Intimate Partner Violence: Not on file    Family History  Problem Relation Age of Onset  . Depression Mother   . Anxiety disorder Mother   . Heart disease Mother   . Bipolar disorder Father   . Hypertension Father   . Diabetes Father   . Depression Brother   . Anxiety disorder Brother      Immunization History  Administered Date(s) Administered  . Influenza-Unspecified 06/14/2020  . PFIZER(Purple Top)SARS-COV-2 Vaccination 08/18/2020, 09/08/2020  . Tdap 12/02/2018    Outpatient Encounter Medications as of 10/06/2020  Medication Sig  . amLODipine (NORVASC) 10 MG tablet Take 1 tablet (10 mg total) by mouth daily.  . carbamazepine (TEGRETOL) 200 MG tablet 200 mg 2 (two) times daily.   . citalopram (CELEXA) 10 MG tablet Take 20 mg by mouth every morning.  . lansoprazole (PREVACID) 30 MG capsule Take 30 mg by mouth daily at 12 noon.  Marland Kitchen levonorgestrel (MIRENA) 20 MCG/24HR IUD 1 Intra Uterine Device (1 each total) by Intrauterine route once for 1 dose.  . losartan-hydrochlorothiazide (HYZAAR) 100-25 MG tablet Take 1 tablet by mouth daily.  Marland Kitchen OLANZapine (ZYPREXA) 2.5 MG tablet Take 2.5 mg by mouth daily. 3 tabs daily  . [  DISCONTINUED] hydrochlorothiazide (HYDRODIURIL) 25 MG tablet Take 1 tablet (25 mg total) by mouth daily.   No facility-administered encounter medications on file as of 10/06/2020.     ROS: Pertinent positives and negatives noted in HPI. Remainder of ROS non-contributory    No Known Allergies  BP (!) 154/100   Pulse 73   Temp 98.7 F (37.1 C) (Temporal)   Ht 5\' 3"  (1.6 m)   Wt 271 lb 6.4 oz (123.1 kg)   SpO2 97%   BMI 48.08 kg/m    BP Readings from Last 3 Encounters:  10/06/20 (!) 154/100  09/22/20 (!) 176/104  09/18/20 (!) 184/87    Physical Exam Constitutional:      General: She is not in acute distress.    Appearance: Normal appearance. She is not ill-appearing.  Cardiovascular:     Rate and Rhythm: Normal rate and regular rhythm.      Pulses: Normal pulses.  Pulmonary:     Effort: Pulmonary effort is normal. No respiratory distress.     Breath sounds: Normal breath sounds.  Musculoskeletal:     Right lower leg: No edema.     Left lower leg: No edema.  Neurological:     Mental Status: She is alert and oriented to person, place, and time.  Psychiatric:        Mood and Affect: Mood normal.        Behavior: Behavior normal.      A/P:  1. Primary hypertension - uncontrolled - stop amlodipine and HCTZ Rx: - losartan-hydrochlorothiazide (HYZAAR) 100-25 MG tablet; Take 1 tablet by mouth daily.  Dispense: 90 tablet; Refill: 1 - check BP at home daily and keep log - f/u in 2-3 wks - VV is fine, up to pt   This visit occurred during the SARS-CoV-2 public health emergency.  Safety protocols were in place, including screening questions prior to the visit, additional usage of staff PPE, and extensive cleaning of exam room while observing appropriate contact time as indicated for disinfecting solutions.

## 2020-10-06 NOTE — Patient Instructions (Signed)
Stop amlodipine. Stop HCTZ 25mg  tabs We're going to switch to a combination pill - Hyzaar (losartan & HCTZ) 100-25mg  1 tab daily Check BP daily at least 1 hr after taking medication Goal is less than 140 / less than 90 Follow-up (virtual visit) in 2-3 wks or sooner if needed

## 2020-10-11 DIAGNOSIS — F5105 Insomnia due to other mental disorder: Secondary | ICD-10-CM | POA: Diagnosis not present

## 2020-10-11 DIAGNOSIS — F603 Borderline personality disorder: Secondary | ICD-10-CM | POA: Diagnosis not present

## 2020-10-11 DIAGNOSIS — F3181 Bipolar II disorder: Secondary | ICD-10-CM | POA: Diagnosis not present

## 2020-10-11 DIAGNOSIS — F1210 Cannabis abuse, uncomplicated: Secondary | ICD-10-CM | POA: Diagnosis not present

## 2020-10-13 DIAGNOSIS — F322 Major depressive disorder, single episode, severe without psychotic features: Secondary | ICD-10-CM | POA: Diagnosis not present

## 2020-10-13 DIAGNOSIS — F411 Generalized anxiety disorder: Secondary | ICD-10-CM | POA: Diagnosis not present

## 2020-10-13 DIAGNOSIS — F603 Borderline personality disorder: Secondary | ICD-10-CM | POA: Diagnosis not present

## 2020-10-28 ENCOUNTER — Encounter: Payer: Self-pay | Admitting: Family Medicine

## 2020-10-28 ENCOUNTER — Telehealth (INDEPENDENT_AMBULATORY_CARE_PROVIDER_SITE_OTHER): Payer: BC Managed Care – PPO | Admitting: Family Medicine

## 2020-10-28 VITALS — Ht 63.0 in | Wt 267.0 lb

## 2020-10-28 DIAGNOSIS — I10 Essential (primary) hypertension: Secondary | ICD-10-CM | POA: Insufficient documentation

## 2020-10-28 NOTE — Progress Notes (Signed)
Virtual Visit via Video Note  I connected with [name] on 10/28/20 at  4:00 PM EST by a video enabled telemedicine application and verified that I am speaking with the correct person using two identifiers. Location patient: home Location provider: work or home office Persons participating in the virtual visit: patient, provider  I discussed the limitations of evaluation and management by telemedicine and the availability of in person appointments. The patient expressed understanding and agreed to proceed.  Chief Complaint  Patient presents with  . Follow-up    F/u BP.  Average BP 120-140/95-115.        HPI: Amanda Nash is a 35 y.o. female seen today for HTN f/u. Pt is currently taking hyzaar 100-25mg  1 tab daily. She was on norvasc 10mg  daily but we stopped this when losartan was added to HCTZ. Pt has declined lisinopril.  She reports home BP readings - 120-140/95-115. SBP average of 130 and DBP average 110-115.  Denies any med side effects.  Diet: no added salt in diet and since early 09/2020 pt has been following low sodium and low carb diet; drinks mainly water  Fam h/o HTN in mother and father. Mother's HTN developed around 4yo as well.   BP Readings from Last 3 Encounters:  10/06/20 (!) 154/100  09/22/20 (!) 176/104  09/18/20 (!) 184/87    Wt Readings from Last 3 Encounters:  10/28/20 267 lb (121.1 kg)  10/06/20 271 lb 6.4 oz (123.1 kg)  09/22/20 272 lb 3.2 oz (123.5 kg)    Past Medical History:  Diagnosis Date  . Anxiety   . Bipolar 1 disorder ([place]) 2016  . Borderline personality disorder ([place]) 2017  . Depression     Past Surgical History:  Procedure Laterality Date  . DILATATION & CURRETTAGE/HYSTEROSCOPY WITH RESECTOCOPE  2011    Family History  Problem Relation Age of Onset  . Depression Mother   . Anxiety disorder Mother   . Heart disease Mother   . Bipolar disorder Father   . Hypertension Father   . Diabetes Father   . Depression Brother    . Anxiety disorder Brother     Social History   Tobacco Use  . Smoking status: Former Smoker    Quit date: 01/31/2005    Years since quitting: 15.7  . Smokeless tobacco: Never Used  Vaping Use  . Vaping Use: Former  Substance Use Topics  . Alcohol use: Yes    Comment: rarely  . Drug use: Yes    Types: Marijuana     Current Outpatient Medications:  .  amLODipine (NORVASC) 10 MG tablet, Take 1 tablet (10 mg total) by mouth daily., Disp: 30 tablet, Rfl: 0 .  carbamazepine (TEGRETOL) 200 MG tablet, 200 mg 2 (two) times daily. , Disp: , Rfl:  .  citalopram (CELEXA) 10 MG tablet, Take 20 mg by mouth every morning., Disp: , Rfl:  .  lansoprazole (PREVACID) 30 MG capsule, Take 30 mg by mouth daily at 12 noon., Disp: , Rfl:  .  losartan-hydrochlorothiazide (HYZAAR) 100-25 MG tablet, Take 1 tablet by mouth daily., Disp: 90 tablet, Rfl: 1 .  OLANZapine (ZYPREXA) 2.5 MG tablet, Take 2.5 mg by mouth daily. 3 tabs daily, Disp: , Rfl:  .  levonorgestrel (MIRENA) 20 MCG/24HR IUD, 1 Intra Uterine Device (1 each total) by Intrauterine route once for 1 dose., Disp: 1 each, Rfl: 0  No Known Allergies    ROS: See pertinent positives and negatives per HPI.  EXAM:  VITALS per patient if applicable:  Ht 5\' 3"  (1.6 m)   Wt 267 lb (121.1 kg) Comment: pt reported  BMI 47.30 kg/m    Wt Readings from Last 3 Encounters:  10/28/20 267 lb (121.1 kg)  10/06/20 271 lb 6.4 oz (123.1 kg)  09/22/20 272 lb 3.2 oz (123.5 kg)   Temp Readings from Last 3 Encounters:  10/06/20 98.7 F (37.1 C) (Temporal)  09/22/20 98.5 F (36.9 C) (Temporal)  09/17/20 98.7 F (37.1 C) (Oral)   BP Readings from Last 3 Encounters:  10/06/20 (!) 154/100  09/22/20 (!) 176/104  09/18/20 (!) 184/87   Pulse Readings from Last 3 Encounters:  10/06/20 73  09/22/20 72  09/18/20 60     GENERAL: alert, oriented, appears well and in no acute distress  NECK: normal movements of the head and neck  LUNGS: on  inspection no signs of respiratory distress, breathing rate appears normal, no obvious gross SOB, gasping or wheezing, no conversational dyspnea  CV: no obvious cyanosis  PSYCH/NEURO: pleasant and cooperative, no obvious depression or anxiety, speech and thought processing grossly intact   ASSESSMENT AND PLAN: 1. Primary hypertension - improved and SBP at goal but DBP still elevated - cont hyzaar 100-25mg  1 tab daily - restart norvasc 10mg  daily - cont with low sodium diet - check BP readings at home and send MyChart message with average and ranges in 1.5-2 wks - pt understands and agrees with plan    I discussed the assessment and treatment plan with the patient. The patient was provided an opportunity to ask questions and all were answered. The patient agreed with the plan and demonstrated an understanding of the instructions.   The patient was advised to call back or seek an in-person evaluation if the symptoms worsen or if the condition fails to improve as anticipated.   Letta Median, DO

## 2020-11-04 MED ORDER — AMLODIPINE BESYLATE 10 MG PO TABS: 10.0000 mg | ORAL_TABLET | Freq: Every day | ORAL | 0 refills | Status: DC

## 2020-11-13 ENCOUNTER — Encounter: Payer: Self-pay | Admitting: Family Medicine

## 2020-11-15 ENCOUNTER — Telehealth: Payer: Self-pay | Admitting: Licensed Clinical Social Worker

## 2020-11-15 NOTE — Telephone Encounter (Signed)
Patient left vm looking for services. Patient looking for psychiatry.

## 2020-12-01 DIAGNOSIS — F322 Major depressive disorder, single episode, severe without psychotic features: Secondary | ICD-10-CM | POA: Diagnosis not present

## 2020-12-01 DIAGNOSIS — F603 Borderline personality disorder: Secondary | ICD-10-CM | POA: Diagnosis not present

## 2020-12-01 DIAGNOSIS — F411 Generalized anxiety disorder: Secondary | ICD-10-CM | POA: Diagnosis not present

## 2020-12-06 ENCOUNTER — Encounter: Payer: Self-pay | Admitting: Family Medicine

## 2020-12-06 DIAGNOSIS — I10 Essential (primary) hypertension: Secondary | ICD-10-CM

## 2020-12-07 MED ORDER — AMLODIPINE BESYLATE 10 MG PO TABS: 10.0000 mg | ORAL_TABLET | Freq: Every day | ORAL | 3 refills | Status: DC

## 2020-12-07 NOTE — Telephone Encounter (Signed)
Please see message and advise.  Thank you. Last fill 11/04/20  #30/0 Last VV 10/28/20

## 2020-12-07 NOTE — Telephone Encounter (Signed)
Refill sent but BP still not at goal on 3 meds. Referral placed to cardio for HTN management

## 2020-12-23 ENCOUNTER — Ambulatory Visit: Payer: Self-pay | Admitting: Family Medicine

## 2020-12-28 ENCOUNTER — Encounter: Payer: Self-pay | Admitting: Family Medicine

## 2020-12-31 ENCOUNTER — Encounter: Payer: Self-pay | Admitting: Family Medicine

## 2020-12-31 ENCOUNTER — Ambulatory Visit (INDEPENDENT_AMBULATORY_CARE_PROVIDER_SITE_OTHER): Payer: BC Managed Care – PPO

## 2020-12-31 ENCOUNTER — Other Ambulatory Visit: Payer: Self-pay

## 2020-12-31 ENCOUNTER — Ambulatory Visit: Payer: BC Managed Care – PPO | Admitting: Family Medicine

## 2020-12-31 VITALS — BP 130/86 | HR 74 | Temp 98.1°F | Ht 63.0 in | Wt 257.6 lb

## 2020-12-31 DIAGNOSIS — M5412 Radiculopathy, cervical region: Secondary | ICD-10-CM

## 2020-12-31 DIAGNOSIS — R200 Anesthesia of skin: Secondary | ICD-10-CM

## 2020-12-31 DIAGNOSIS — M542 Cervicalgia: Secondary | ICD-10-CM

## 2020-12-31 DIAGNOSIS — M5442 Lumbago with sciatica, left side: Secondary | ICD-10-CM

## 2020-12-31 DIAGNOSIS — R202 Paresthesia of skin: Secondary | ICD-10-CM | POA: Diagnosis not present

## 2020-12-31 DIAGNOSIS — R7301 Impaired fasting glucose: Secondary | ICD-10-CM | POA: Diagnosis not present

## 2020-12-31 DIAGNOSIS — G8929 Other chronic pain: Secondary | ICD-10-CM

## 2020-12-31 DIAGNOSIS — M545 Low back pain, unspecified: Secondary | ICD-10-CM | POA: Diagnosis not present

## 2020-12-31 DIAGNOSIS — M5441 Lumbago with sciatica, right side: Secondary | ICD-10-CM

## 2020-12-31 MED ORDER — METHYLPREDNISOLONE 4 MG PO TBPK: ORAL_TABLET | ORAL | 0 refills | Status: DC

## 2020-12-31 NOTE — Progress Notes (Incomplete)
Amanda Nash is a 35 y.o. female  Chief Complaint  Patient presents with  . Numbness    Pt has been experiencing numbness for at least 1 year worsening over the past 6 months.  Pt explains that she has it in both feet and it goes up to her legs and then hips.    HPI: ***  Occurring more frequently - now daily to 2-3x/day. Symptoms last 5-70min of numbness and then house  Numbness starts in feet, then paresthesias, then shooting pain up leg.  She stands and bears weight on alternating feet Happens when seated except for today when it occurred when she was walking.  Back pain - lower. Not new but possible worse. "stiffness" and "dull" pain No swelling.  Past Medical History:  Diagnosis Date  . Anxiety   . Bipolar 1 disorder ([place]) 2016  . Borderline personality disorder ([place]) 2017  . Depression     Past Surgical History:  Procedure Laterality Date  . DILATATION & CURRETTAGE/HYSTEROSCOPY WITH RESECTOCOPE  2011    Social History   Socioeconomic History  . Marital status: Married    Spouse name: Shela Nevin  . Number of children: Not on file  . Years of education: Not on file  . Highest education level: Not on file  Occupational History  . Occupation: Systems developer    Comment: [employer]  Tobacco Use  . Smoking status: Former Smoker    Quit date: 01/31/2005    Years since quitting: 15.9  . Smokeless tobacco: Never Used  Vaping Use  . Vaping Use: Former  Substance and Sexual Activity  . Alcohol use: Yes    Comment: rarely  . Drug use: Yes    Types: Marijuana  . Sexual activity: Yes    Partners: Male    Birth control/protection: None  Other Topics Concern  . Not on file  Social History Narrative   Live with husband and her brother, has 2 dogs and a bunny at home. Has stepdaughter that does not live with them.   Social Determinants of Health   Financial Resource Strain: Not on file  Food Insecurity: Not on file  Transportation Needs: Not on file  Physical  Activity: Not on file  Stress: Not on file  Social Connections: Not on file  Intimate Partner Violence: Not on file    Family History  Problem Relation Age of Onset  . Depression Mother   . Anxiety disorder Mother   . Heart disease Mother   . Bipolar disorder Father   . Hypertension Father   . Diabetes Father   . Depression Brother   . Anxiety disorder Brother      Immunization History  Administered Date(s) Administered  . Influenza-Unspecified 06/14/2020  . PFIZER(Purple Top)SARS-COV-2 Vaccination 08/18/2020, 09/08/2020  . Tdap 12/02/2018    Outpatient Encounter Medications as of 12/31/2020  Medication Sig  . amLODipine (NORVASC) 10 MG tablet Take 1 tablet (10 mg total) by mouth daily.  . carbamazepine (TEGRETOL) 200 MG tablet 200 mg 2 (two) times daily.   . citalopram (CELEXA) 10 MG tablet Take 20 mg by mouth every morning.  . lansoprazole (PREVACID) 30 MG capsule Take 30 mg by mouth daily at 12 noon.  Marland Kitchen losartan-hydrochlorothiazide (HYZAAR) 100-25 MG tablet Take 1 tablet by mouth daily.  Marland Kitchen OLANZapine (ZYPREXA) 2.5 MG tablet Take 2.5 mg by mouth daily. 3 tabs daily  . levonorgestrel (MIRENA) 20 MCG/24HR IUD 1 Intra Uterine Device (1 each total) by Intrauterine route once for  1 dose.   No facility-administered encounter medications on file as of 12/31/2020.     ROS: Gen: no fever, chills  Skin: no rash, itching ENT: no ear pain, ear drainage, nasal congestion, rhinorrhea, sinus pressure, sore throat Eyes: no blurry vision, double vision Resp: no cough, wheeze,SOB CV: no CP, palpitations, LE edema,  GI: no heartburn, n/v/d/c, abd pain GU: no dysuria, urgency, frequency, hematuria  MSK: no joint pain, myalgias, back pain Neuro: no dizziness, headache, weakness, vertigo Psych: no depression, anxiety, insomnia   No Known Allergies  BP 130/86 (BP Location: Left Arm, Patient Position: Sitting, Cuff Size: Large)   Pulse 74   Temp 98.1 F (36.7 C) (Oral)   Ht 5\' 3"   (1.6 m)   Wt 257 lb 9.6 oz (116.8 kg)   LMP 12/13/2020   SpO2 99%   BMI 45.63 kg/m   Physical Exam   A/P:

## 2021-01-01 LAB — CBC
HCT: 39.7 % (ref 35.0–45.0)
Hemoglobin: 13.7 g/dL (ref 11.7–15.5)
MCH: 29.5 pg (ref 27.0–33.0)
MCHC: 34.5 g/dL (ref 32.0–36.0)
MCV: 85.6 fL (ref 80.0–100.0)
MPV: 9.8 fL (ref 7.5–12.5)
Platelets: 310 10*3/uL (ref 140–400)
RBC: 4.64 10*6/uL (ref 3.80–5.10)
RDW: 14.5 % (ref 11.0–15.0)
WBC: 8.9 10*3/uL (ref 3.8–10.8)

## 2021-01-01 LAB — IRON,TIBC AND FERRITIN PANEL
%SAT: 19 % (calc) (ref 16–45)
Ferritin: 21 ng/mL (ref 16–154)
Iron: 56 ug/dL (ref 40–190)
TIBC: 293 mcg/dL (calc) (ref 250–450)

## 2021-01-01 LAB — HEMOGLOBIN A1C
Hgb A1c MFr Bld: 5.7 % of total Hgb — ABNORMAL HIGH (ref <5.7)
Mean Plasma Glucose: 117 mg/dL
eAG (mmol/L): 6.5 mmol/L

## 2021-01-01 LAB — VITAMIN B12: Vitamin B-12: 497 pg/mL (ref 200–1100)

## 2021-01-03 ENCOUNTER — Encounter: Payer: Self-pay | Admitting: Family Medicine

## 2021-01-03 NOTE — Telephone Encounter (Signed)
Please see message and advise.  Thank you. ° °

## 2021-01-05 ENCOUNTER — Encounter: Payer: Self-pay | Admitting: Family Medicine

## 2021-01-12 ENCOUNTER — Encounter: Payer: Self-pay | Admitting: Family Medicine

## 2021-01-12 DIAGNOSIS — R202 Paresthesia of skin: Secondary | ICD-10-CM

## 2021-01-12 DIAGNOSIS — M5412 Radiculopathy, cervical region: Secondary | ICD-10-CM

## 2021-01-12 DIAGNOSIS — R200 Anesthesia of skin: Secondary | ICD-10-CM

## 2021-01-12 DIAGNOSIS — G8929 Other chronic pain: Secondary | ICD-10-CM

## 2021-01-12 DIAGNOSIS — M542 Cervicalgia: Secondary | ICD-10-CM

## 2021-01-12 DIAGNOSIS — M5442 Lumbago with sciatica, left side: Secondary | ICD-10-CM

## 2021-01-24 DIAGNOSIS — M5416 Radiculopathy, lumbar region: Secondary | ICD-10-CM | POA: Diagnosis not present

## 2021-01-24 DIAGNOSIS — R202 Paresthesia of skin: Secondary | ICD-10-CM | POA: Diagnosis not present

## 2021-01-24 DIAGNOSIS — R200 Anesthesia of skin: Secondary | ICD-10-CM | POA: Diagnosis not present

## 2021-01-26 DIAGNOSIS — F411 Generalized anxiety disorder: Secondary | ICD-10-CM | POA: Diagnosis not present

## 2021-01-26 DIAGNOSIS — F603 Borderline personality disorder: Secondary | ICD-10-CM | POA: Diagnosis not present

## 2021-01-26 DIAGNOSIS — F322 Major depressive disorder, single episode, severe without psychotic features: Secondary | ICD-10-CM | POA: Diagnosis not present

## 2021-01-27 ENCOUNTER — Encounter: Payer: Self-pay | Admitting: Family Medicine

## 2021-01-27 NOTE — Progress Notes (Signed)
Amanda Nash is a 35 y.o. female  Chief Complaint  Patient presents with  . Numbness    Pt has been experiencing numbness for at least 1 year worsening over the past 6 months.  Pt explains that she has it in both feet and it goes up to her legs and then hips.    HPI: Occurring more frequently - now daily to 2-3x/day. Symptoms last 5-49min of numbness and then house  Numbness starts in feet, then paresthesias, then shooting pain up leg.  She stands and bears weight on alternating feet Happens when seated except for today when it occurred when she was walking.  Back pain - lower. Not new but possible worse. "stiffness" and "dull" pain No swelling.   Past Medical History:  Diagnosis Date  . Anxiety   . Bipolar 1 disorder ([place]) 2016  . Borderline personality disorder ([place]) 2017  . Depression     Past Surgical History:  Procedure Laterality Date  . DILATATION & CURRETTAGE/HYSTEROSCOPY WITH RESECTOCOPE  2011    Social History   Socioeconomic History  . Marital status: Married    Spouse name: Shela Nevin  . Number of children: Not on file  . Years of education: Not on file  . Highest education level: Not on file  Occupational History  . Occupation: Systems developer    Comment: [employer]  Tobacco Use  . Smoking status: Former Smoker    Quit date: 01/31/2005    Years since quitting: 16.0  . Smokeless tobacco: Never Used  Vaping Use  . Vaping Use: Former  Substance and Sexual Activity  . Alcohol use: Yes    Comment: rarely  . Drug use: Yes    Types: Marijuana  . Sexual activity: Yes    Partners: Male    Birth control/protection: None  Other Topics Concern  . Not on file  Social History Narrative   Live with husband and her brother, has 2 dogs and a bunny at home. Has stepdaughter that does not live with them.   Social Determinants of Health   Financial Resource Strain: Not on file  Food Insecurity: Not on file  Transportation Needs: Not on file  Physical  Activity: Not on file  Stress: Not on file  Social Connections: Not on file  Intimate Partner Violence: Not on file    Family History  Problem Relation Age of Onset  . Depression Mother   . Anxiety disorder Mother   . Heart disease Mother   . Bipolar disorder Father   . Hypertension Father   . Diabetes Father   . Depression Brother   . Anxiety disorder Brother      Immunization History  Administered Date(s) Administered  . Influenza-Unspecified 06/14/2020  . PFIZER(Purple Top)SARS-COV-2 Vaccination 08/18/2020, 09/08/2020  . Tdap 12/02/2018    Outpatient Encounter Medications as of 12/31/2020  Medication Sig  . amLODipine (NORVASC) 10 MG tablet Take 1 tablet (10 mg total) by mouth daily.  . carbamazepine (TEGRETOL) 200 MG tablet 200 mg 2 (two) times daily.   . citalopram (CELEXA) 10 MG tablet Take 20 mg by mouth every morning.  . lansoprazole (PREVACID) 30 MG capsule Take 30 mg by mouth daily at 12 noon.  Marland Kitchen losartan-hydrochlorothiazide (HYZAAR) 100-25 MG tablet Take 1 tablet by mouth daily.  . methylPREDNISolone (MEDROL DOSEPAK) 4 MG TBPK tablet As directed  . OLANZapine (ZYPREXA) 2.5 MG tablet Take 2.5 mg by mouth daily. 3 tabs daily  . levonorgestrel (MIRENA) 20 MCG/24HR IUD 1 Intra  Uterine Device (1 each total) by Intrauterine route once for 1 dose.   No facility-administered encounter medications on file as of 12/31/2020.     ROS: Pertinent positives and negatives noted in HPI. Remainder of ROS non-contributory   No Known Allergies  BP 130/86 (BP Location: Left Arm, Patient Position: Sitting, Cuff Size: Large)   Pulse 74   Temp 98.1 F (36.7 C) (Oral)   Ht 5\' 3"  (1.6 m)   Wt 257 lb 9.6 oz (116.8 kg)   LMP 12/13/2020   SpO2 99%   BMI 45.63 kg/m   Physical Exam Constitutional:      General: She is not in acute distress.    Appearance: She is obese. She is not ill-appearing.  Musculoskeletal:     Cervical back: Normal range of motion. No rigidity.      Right lower leg: No edema.     Left lower leg: No edema.  Neurological:     General: No focal deficit present.     Mental Status: She is alert and oriented to person, place, and time.     Sensory: No sensory deficit.     Motor: No weakness.     Coordination: Coordination normal.     Gait: Gait normal.  Psychiatric:        Mood and Affect: Mood normal.        Behavior: Behavior normal.      A/P:  1. Numbness and tingling of both lower extremities 2. Chronic bilateral low back pain with bilateral sciatica Rx: - methylPREDNISolone (MEDROL DOSEPAK) 4 MG TBPK tablet; As directed  Dispense: 21 each; Refill: 0 - DG Lumbar Spine Complete - CBC - Iron, TIBC and Ferritin Panel - Vitamin B12  3. Chronic neck pain 4. Cervical radiculopathy - DG Cervical Spine Complete Rx: - methylPREDNISolone (MEDROL DOSEPAK) 4 MG TBPK tablet; As directed  Dispense: 21 each; Refill: 0  5. IFG (impaired fasting glucose) - Hemoglobin A1c

## 2021-02-01 DIAGNOSIS — M6281 Muscle weakness (generalized): Secondary | ICD-10-CM | POA: Diagnosis not present

## 2021-02-01 DIAGNOSIS — M799 Soft tissue disorder, unspecified: Secondary | ICD-10-CM | POA: Diagnosis not present

## 2021-02-01 DIAGNOSIS — M25571 Pain in right ankle and joints of right foot: Secondary | ICD-10-CM | POA: Diagnosis not present

## 2021-02-01 DIAGNOSIS — M25572 Pain in left ankle and joints of left foot: Secondary | ICD-10-CM | POA: Diagnosis not present

## 2021-02-04 DIAGNOSIS — R200 Anesthesia of skin: Secondary | ICD-10-CM | POA: Diagnosis not present

## 2021-02-04 DIAGNOSIS — R202 Paresthesia of skin: Secondary | ICD-10-CM | POA: Diagnosis not present

## 2021-02-08 ENCOUNTER — Ambulatory Visit: Payer: BC Managed Care – PPO | Admitting: Cardiovascular Disease

## 2021-02-11 DIAGNOSIS — R200 Anesthesia of skin: Secondary | ICD-10-CM | POA: Diagnosis not present

## 2021-02-14 DIAGNOSIS — M25571 Pain in right ankle and joints of right foot: Secondary | ICD-10-CM | POA: Diagnosis not present

## 2021-02-14 DIAGNOSIS — M799 Soft tissue disorder, unspecified: Secondary | ICD-10-CM | POA: Diagnosis not present

## 2021-02-14 DIAGNOSIS — M25572 Pain in left ankle and joints of left foot: Secondary | ICD-10-CM | POA: Diagnosis not present

## 2021-02-14 DIAGNOSIS — M6281 Muscle weakness (generalized): Secondary | ICD-10-CM | POA: Diagnosis not present

## 2021-02-16 DIAGNOSIS — M25571 Pain in right ankle and joints of right foot: Secondary | ICD-10-CM | POA: Diagnosis not present

## 2021-02-16 DIAGNOSIS — M25572 Pain in left ankle and joints of left foot: Secondary | ICD-10-CM | POA: Diagnosis not present

## 2021-02-16 DIAGNOSIS — M6281 Muscle weakness (generalized): Secondary | ICD-10-CM | POA: Diagnosis not present

## 2021-02-16 DIAGNOSIS — M799 Soft tissue disorder, unspecified: Secondary | ICD-10-CM | POA: Diagnosis not present

## 2021-02-23 DIAGNOSIS — M6281 Muscle weakness (generalized): Secondary | ICD-10-CM | POA: Diagnosis not present

## 2021-02-23 DIAGNOSIS — M25572 Pain in left ankle and joints of left foot: Secondary | ICD-10-CM | POA: Diagnosis not present

## 2021-02-23 DIAGNOSIS — M25571 Pain in right ankle and joints of right foot: Secondary | ICD-10-CM | POA: Diagnosis not present

## 2021-02-23 DIAGNOSIS — M799 Soft tissue disorder, unspecified: Secondary | ICD-10-CM | POA: Diagnosis not present

## 2021-02-25 DIAGNOSIS — M25572 Pain in left ankle and joints of left foot: Secondary | ICD-10-CM | POA: Diagnosis not present

## 2021-02-25 DIAGNOSIS — M25571 Pain in right ankle and joints of right foot: Secondary | ICD-10-CM | POA: Diagnosis not present

## 2021-02-25 DIAGNOSIS — M799 Soft tissue disorder, unspecified: Secondary | ICD-10-CM | POA: Diagnosis not present

## 2021-02-25 DIAGNOSIS — M6281 Muscle weakness (generalized): Secondary | ICD-10-CM | POA: Diagnosis not present

## 2021-02-28 DIAGNOSIS — M799 Soft tissue disorder, unspecified: Secondary | ICD-10-CM | POA: Diagnosis not present

## 2021-02-28 DIAGNOSIS — M25572 Pain in left ankle and joints of left foot: Secondary | ICD-10-CM | POA: Diagnosis not present

## 2021-02-28 DIAGNOSIS — M25571 Pain in right ankle and joints of right foot: Secondary | ICD-10-CM | POA: Diagnosis not present

## 2021-02-28 DIAGNOSIS — M6281 Muscle weakness (generalized): Secondary | ICD-10-CM | POA: Diagnosis not present

## 2021-03-02 DIAGNOSIS — M799 Soft tissue disorder, unspecified: Secondary | ICD-10-CM | POA: Diagnosis not present

## 2021-03-02 DIAGNOSIS — M25571 Pain in right ankle and joints of right foot: Secondary | ICD-10-CM | POA: Diagnosis not present

## 2021-03-02 DIAGNOSIS — M6281 Muscle weakness (generalized): Secondary | ICD-10-CM | POA: Diagnosis not present

## 2021-03-02 DIAGNOSIS — F603 Borderline personality disorder: Secondary | ICD-10-CM | POA: Diagnosis not present

## 2021-03-02 DIAGNOSIS — M25572 Pain in left ankle and joints of left foot: Secondary | ICD-10-CM | POA: Diagnosis not present

## 2021-03-02 DIAGNOSIS — F411 Generalized anxiety disorder: Secondary | ICD-10-CM | POA: Diagnosis not present

## 2021-03-02 DIAGNOSIS — F322 Major depressive disorder, single episode, severe without psychotic features: Secondary | ICD-10-CM | POA: Diagnosis not present

## 2021-03-09 DIAGNOSIS — M6281 Muscle weakness (generalized): Secondary | ICD-10-CM | POA: Diagnosis not present

## 2021-03-09 DIAGNOSIS — M799 Soft tissue disorder, unspecified: Secondary | ICD-10-CM | POA: Diagnosis not present

## 2021-03-09 DIAGNOSIS — M25571 Pain in right ankle and joints of right foot: Secondary | ICD-10-CM | POA: Diagnosis not present

## 2021-03-09 DIAGNOSIS — M25572 Pain in left ankle and joints of left foot: Secondary | ICD-10-CM | POA: Diagnosis not present

## 2021-03-11 DIAGNOSIS — M25572 Pain in left ankle and joints of left foot: Secondary | ICD-10-CM | POA: Diagnosis not present

## 2021-03-11 DIAGNOSIS — M799 Soft tissue disorder, unspecified: Secondary | ICD-10-CM | POA: Diagnosis not present

## 2021-03-11 DIAGNOSIS — M6281 Muscle weakness (generalized): Secondary | ICD-10-CM | POA: Diagnosis not present

## 2021-03-11 DIAGNOSIS — M25571 Pain in right ankle and joints of right foot: Secondary | ICD-10-CM | POA: Diagnosis not present

## 2021-03-16 DIAGNOSIS — M25571 Pain in right ankle and joints of right foot: Secondary | ICD-10-CM | POA: Diagnosis not present

## 2021-03-16 DIAGNOSIS — M6281 Muscle weakness (generalized): Secondary | ICD-10-CM | POA: Diagnosis not present

## 2021-03-16 DIAGNOSIS — M25572 Pain in left ankle and joints of left foot: Secondary | ICD-10-CM | POA: Diagnosis not present

## 2021-03-16 DIAGNOSIS — M799 Soft tissue disorder, unspecified: Secondary | ICD-10-CM | POA: Diagnosis not present

## 2021-03-18 DIAGNOSIS — M25571 Pain in right ankle and joints of right foot: Secondary | ICD-10-CM | POA: Diagnosis not present

## 2021-03-18 DIAGNOSIS — M25572 Pain in left ankle and joints of left foot: Secondary | ICD-10-CM | POA: Diagnosis not present

## 2021-03-18 DIAGNOSIS — M6281 Muscle weakness (generalized): Secondary | ICD-10-CM | POA: Diagnosis not present

## 2021-03-18 DIAGNOSIS — M799 Soft tissue disorder, unspecified: Secondary | ICD-10-CM | POA: Diagnosis not present

## 2021-03-23 DIAGNOSIS — M799 Soft tissue disorder, unspecified: Secondary | ICD-10-CM | POA: Diagnosis not present

## 2021-03-23 DIAGNOSIS — M25572 Pain in left ankle and joints of left foot: Secondary | ICD-10-CM | POA: Diagnosis not present

## 2021-03-23 DIAGNOSIS — M25571 Pain in right ankle and joints of right foot: Secondary | ICD-10-CM | POA: Diagnosis not present

## 2021-03-23 DIAGNOSIS — M6281 Muscle weakness (generalized): Secondary | ICD-10-CM | POA: Diagnosis not present

## 2021-03-25 DIAGNOSIS — M6281 Muscle weakness (generalized): Secondary | ICD-10-CM | POA: Diagnosis not present

## 2021-03-25 DIAGNOSIS — R202 Paresthesia of skin: Secondary | ICD-10-CM | POA: Diagnosis not present

## 2021-03-25 DIAGNOSIS — M25572 Pain in left ankle and joints of left foot: Secondary | ICD-10-CM | POA: Diagnosis not present

## 2021-03-25 DIAGNOSIS — M5416 Radiculopathy, lumbar region: Secondary | ICD-10-CM | POA: Diagnosis not present

## 2021-03-25 DIAGNOSIS — M25571 Pain in right ankle and joints of right foot: Secondary | ICD-10-CM | POA: Diagnosis not present

## 2021-03-25 DIAGNOSIS — R200 Anesthesia of skin: Secondary | ICD-10-CM | POA: Diagnosis not present

## 2021-03-25 DIAGNOSIS — M799 Soft tissue disorder, unspecified: Secondary | ICD-10-CM | POA: Diagnosis not present

## 2021-03-29 ENCOUNTER — Encounter: Payer: Self-pay | Admitting: Family Medicine

## 2021-03-29 ENCOUNTER — Other Ambulatory Visit: Payer: Self-pay

## 2021-03-29 ENCOUNTER — Ambulatory Visit (INDEPENDENT_AMBULATORY_CARE_PROVIDER_SITE_OTHER): Payer: BC Managed Care – PPO | Admitting: Family Medicine

## 2021-03-29 VITALS — BP 138/82 | HR 82 | Temp 98.2°F | Ht 63.0 in | Wt 252.0 lb

## 2021-03-29 DIAGNOSIS — I10 Essential (primary) hypertension: Secondary | ICD-10-CM

## 2021-03-29 DIAGNOSIS — F603 Borderline personality disorder: Secondary | ICD-10-CM

## 2021-03-29 DIAGNOSIS — G5603 Carpal tunnel syndrome, bilateral upper limbs: Secondary | ICD-10-CM

## 2021-03-29 MED ORDER — LOSARTAN POTASSIUM-HCTZ 100-25 MG PO TABS: 1.0000 | ORAL_TABLET | Freq: Every day | ORAL | 1 refills | Status: DC

## 2021-03-29 NOTE — Progress Notes (Signed)
Primary Care / Sports Medicine Office Visit  Patient Information:  Patient ID: Amanda Nash, female DOB: 1985/11/22 Age: 35 y.o. MRN: [id number]   Amanda Nash is a pleasant 35 y.o. female presenting with the following:  Chief Complaint  Patient presents with   Hypertension    After 2nd covid shot bp has been elevated, is on BP medication that is working    Anxiety    Pt feels fine,smoking and drinking more since she has stopped taking anxiety meds and stopped smoking marijuana she has been smoking and drinking more to cope with her anxiety. Was seeing psychiatrist but not anymore who prescribed anxiety medication (celexa) all for a cheaper alternative medication    Establish Care    Review of Systems pertinent details above   Patient Active Problem List   Diagnosis Date Noted   Carpal tunnel syndrome, bilateral 03/29/2021   Primary hypertension 10/28/2020   Chronic pelvic pain in female 02/26/2019   Prediabetes 12/24/2018   Thrombocytosis 12/24/2018   Anemia 12/24/2018   Dysmenorrhea 12/02/2018   Menorrhagia with irregular cycle 12/02/2018   Morbid obesity ([place]) 12/02/2018   Borderline personality disorder ([place]) 09/27/2016   Past Medical History:  Diagnosis Date   Anxiety    Bipolar 1 disorder ([place]) 2016   Depression    Outpatient Encounter Medications as of 03/29/2021  Medication Sig   amLODipine (NORVASC) 10 MG tablet Take 1 tablet (10 mg total) by mouth daily.   levonorgestrel (MIRENA) 20 MCG/24HR IUD 1 Intra Uterine Device (1 each total) by Intrauterine route once for 1 dose.   [DISCONTINUED] losartan-hydrochlorothiazide (HYZAAR) 100-25 MG tablet Take 1 tablet by mouth daily.   losartan-hydrochlorothiazide (HYZAAR) 100-25 MG tablet Take 1 tablet by mouth daily.   [DISCONTINUED] carbamazepine (TEGRETOL) 200 MG tablet 200 mg 2 (two) times daily.    [DISCONTINUED] citalopram (CELEXA) 10 MG tablet Take 20 mg by mouth every morning.   [DISCONTINUED] lansoprazole  (PREVACID) 30 MG capsule Take 30 mg by mouth daily at 12 noon.   [DISCONTINUED] methylPREDNISolone (MEDROL DOSEPAK) 4 MG TBPK tablet As directed   [DISCONTINUED] OLANZapine (ZYPREXA) 2.5 MG tablet Take 2.5 mg by mouth daily. 3 tabs daily   No facility-administered encounter medications on file as of 03/29/2021.   Past Surgical History:  Procedure Laterality Date   DILATATION & CURRETTAGE/HYSTEROSCOPY WITH RESECTOCOPE  2011    Vitals:   03/29/21 1522  BP: 138/82  Pulse: 82  Temp: 98.2 F (36.8 C)  SpO2: 99%   Vitals:   03/29/21 1522  Weight: 252 lb (114.3 kg)  Height: 5\' 3"  (1.6 m)   Body mass index is 44.64 kg/m.  No results found.   Independent interpretation of notes and tests performed by another provider:   None  Procedures performed:   None  Pertinent History, Exam, Impression, and Recommendations:   Borderline personality disorder [facility]) Patient with history of the same, previously established with psychiatry and is amenable to establishing care with a group closer to home.  Feels her symptoms are currently somewhat stable.  A referral was placed from our office today, additional information was provided to the patient in regards to scheduling, we will follow peripherally on this issue.  Primary hypertension Patient is currently asymptomatic, cardiopulmonary findings are benign today on exam.  She has been stable on current pharmacotherapy regimen, I have advised risk stratification labs and these will be coordinated at her annual physical/next visit.  In the interim I have prescribed hydrochlorothiazide  which she will dose alongside her amlodipine.  Carpal tunnel syndrome, bilateral Is a chronic issue that is currently symptomatic, symptoms primarily involving digits 1-3, does have intermittent symptoms involving the ulnar digits as well bilaterally.  Her examination today reveals no thenar atrophy, positive Phalen's, negative Tinel's, cervical exam is significant  for mild paraspinal right-sided spasm with trigger points, otherwise Spurling's negative, upper extremity strength is preserved and symmetric.  I have reviewed various treatment strategies for bilateral carpal tunnel syndrome and she will trial night splints, home-based rehab, and we can advance to further measures such as ultrasound-guided Hydro dissection/physical therapy if symptoms persist.   Orders & Medications Meds ordered this encounter  Medications   losartan-hydrochlorothiazide (HYZAAR) 100-25 MG tablet    Sig: Take 1 tablet by mouth daily.    Dispense:  90 tablet    Refill:  1   Orders Placed This Encounter  Procedures   Ambulatory referral to Psychology     Return in about 4 weeks (around 04/26/2021) for annual physical.     Montel Culver, MD   [place]

## 2021-03-29 NOTE — Patient Instructions (Signed)
-   Continue current blood pressure medications - Referral coordinator will contact you in regards to psychiatry follow-up - Can review list provided for additional sites - Use night splints  - Start and gradually progress carpal tunnel exercises - Return in 4 weeks for physical - Contact for questions

## 2021-03-29 NOTE — Assessment & Plan Note (Signed)
Patient is currently asymptomatic, cardiopulmonary findings are benign today on exam.  She has been stable on current pharmacotherapy regimen, I have advised risk stratification labs and these will be coordinated at her annual physical/next visit.  In the interim I have prescribed hydrochlorothiazide which she will dose alongside her amlodipine.

## 2021-03-29 NOTE — Assessment & Plan Note (Signed)
Is a chronic issue that is currently symptomatic, symptoms primarily involving digits 1-3, does have intermittent symptoms involving the ulnar digits as well bilaterally.  Her examination today reveals no thenar atrophy, positive Phalen's, negative Tinel's, cervical exam is significant for mild paraspinal right-sided spasm with trigger points, otherwise Spurling's negative, upper extremity strength is preserved and symmetric.  I have reviewed various treatment strategies for bilateral carpal tunnel syndrome and she will trial night splints, home-based rehab, and we can advance to further measures such as ultrasound-guided Hydro dissection/physical therapy if symptoms persist.

## 2021-03-29 NOTE — Assessment & Plan Note (Signed)
Patient with history of the same, previously established with psychiatry and is amenable to establishing care with a group closer to home.  Feels her symptoms are currently somewhat stable.  A referral was placed from our office today, additional information was provided to the patient in regards to scheduling, we will follow peripherally on this issue.

## 2021-03-30 DIAGNOSIS — F603 Borderline personality disorder: Secondary | ICD-10-CM | POA: Diagnosis not present

## 2021-03-30 DIAGNOSIS — F322 Major depressive disorder, single episode, severe without psychotic features: Secondary | ICD-10-CM | POA: Diagnosis not present

## 2021-03-30 DIAGNOSIS — F411 Generalized anxiety disorder: Secondary | ICD-10-CM | POA: Diagnosis not present

## 2021-04-01 DIAGNOSIS — M25572 Pain in left ankle and joints of left foot: Secondary | ICD-10-CM | POA: Diagnosis not present

## 2021-04-01 DIAGNOSIS — M25571 Pain in right ankle and joints of right foot: Secondary | ICD-10-CM | POA: Diagnosis not present

## 2021-04-01 DIAGNOSIS — M6281 Muscle weakness (generalized): Secondary | ICD-10-CM | POA: Diagnosis not present

## 2021-04-01 DIAGNOSIS — M799 Soft tissue disorder, unspecified: Secondary | ICD-10-CM | POA: Diagnosis not present

## 2021-04-11 DIAGNOSIS — M25571 Pain in right ankle and joints of right foot: Secondary | ICD-10-CM | POA: Diagnosis not present

## 2021-04-11 DIAGNOSIS — M799 Soft tissue disorder, unspecified: Secondary | ICD-10-CM | POA: Diagnosis not present

## 2021-04-11 DIAGNOSIS — Z20822 Contact with and (suspected) exposure to covid-19: Secondary | ICD-10-CM | POA: Diagnosis not present

## 2021-04-11 DIAGNOSIS — M25572 Pain in left ankle and joints of left foot: Secondary | ICD-10-CM | POA: Diagnosis not present

## 2021-04-11 DIAGNOSIS — M6281 Muscle weakness (generalized): Secondary | ICD-10-CM | POA: Diagnosis not present

## 2021-04-14 ENCOUNTER — Encounter: Payer: Self-pay | Admitting: Family Medicine

## 2021-04-18 DIAGNOSIS — M25572 Pain in left ankle and joints of left foot: Secondary | ICD-10-CM | POA: Diagnosis not present

## 2021-04-18 DIAGNOSIS — M25571 Pain in right ankle and joints of right foot: Secondary | ICD-10-CM | POA: Diagnosis not present

## 2021-04-18 DIAGNOSIS — M6281 Muscle weakness (generalized): Secondary | ICD-10-CM | POA: Diagnosis not present

## 2021-04-18 DIAGNOSIS — M799 Soft tissue disorder, unspecified: Secondary | ICD-10-CM | POA: Diagnosis not present

## 2021-04-22 DIAGNOSIS — M25572 Pain in left ankle and joints of left foot: Secondary | ICD-10-CM | POA: Diagnosis not present

## 2021-04-22 DIAGNOSIS — M6281 Muscle weakness (generalized): Secondary | ICD-10-CM | POA: Diagnosis not present

## 2021-04-22 DIAGNOSIS — M799 Soft tissue disorder, unspecified: Secondary | ICD-10-CM | POA: Diagnosis not present

## 2021-04-22 DIAGNOSIS — M25571 Pain in right ankle and joints of right foot: Secondary | ICD-10-CM | POA: Diagnosis not present

## 2021-04-24 DIAGNOSIS — Z20822 Contact with and (suspected) exposure to covid-19: Secondary | ICD-10-CM | POA: Diagnosis not present

## 2021-04-26 ENCOUNTER — Ambulatory Visit: Payer: BC Managed Care – PPO | Admitting: Obstetrics and Gynecology

## 2021-04-28 ENCOUNTER — Ambulatory Visit: Payer: Self-pay | Admitting: Family Medicine

## 2021-05-02 DIAGNOSIS — Z20822 Contact with and (suspected) exposure to covid-19: Secondary | ICD-10-CM | POA: Diagnosis not present

## 2021-05-03 ENCOUNTER — Ambulatory Visit (INDEPENDENT_AMBULATORY_CARE_PROVIDER_SITE_OTHER): Payer: BC Managed Care – PPO | Admitting: Family Medicine

## 2021-05-03 ENCOUNTER — Encounter: Payer: Self-pay | Admitting: Family Medicine

## 2021-05-03 ENCOUNTER — Other Ambulatory Visit: Payer: Self-pay

## 2021-05-03 VITALS — BP 118/72 | HR 76 | Temp 98.5°F | Ht 63.0 in | Wt 250.0 lb

## 2021-05-03 DIAGNOSIS — Z0000 Encounter for general adult medical examination without abnormal findings: Secondary | ICD-10-CM | POA: Insufficient documentation

## 2021-05-03 DIAGNOSIS — R7309 Other abnormal glucose: Secondary | ICD-10-CM

## 2021-05-03 DIAGNOSIS — R7989 Other specified abnormal findings of blood chemistry: Secondary | ICD-10-CM | POA: Diagnosis not present

## 2021-05-03 DIAGNOSIS — Z13220 Encounter for screening for lipoid disorders: Secondary | ICD-10-CM

## 2021-05-03 NOTE — Progress Notes (Signed)
Annual Physical Exam Visit  Patient Information:  Patient ID: KYANNE ESKIN, female DOB: [birth year] Age: 35 y.o. MRN: [id number]   Subjective:   CC: Annual Physical Exam  HPI:  TANETTE ARMADA is here for their annual physical.  I reviewed the past medical history, family history, social history, surgical history, and allergies today and changes were made as necessary.  Please see the problem list section below for additional details.  Health Habits Eye Exam: 2021 Dental Exam: recommend 6 months Exercise: minimal (finished PT 1 hour workouts twice a week), now 30 minutes twice weekly (stretching) Diet: 6-8 oz lean protein, avoid pork and red meat, low carb, low sodium, spinach  GYN: Sexual Health Menstrual status: current LMP: 04/11/2021 Menses: Light x 4 days (with Mirena) Last pap smear: 12/2018 History of abnormal pap smears: no Sexually active: yes Current contraception: Mirena   Past Medical History: Past Medical History:  Diagnosis Date   Anxiety    Bipolar 1 disorder ([place]) 2016   Depression    Hypertension    Past Surgical History: Past Surgical History:  Procedure Laterality Date   DILATATION & CURRETTAGE/HYSTEROSCOPY WITH RESECTOCOPE  2011   Social History: Social History   Socioeconomic History   Marital status: Married    Spouse name: Kashauna Swanick   Number of children: 0   Years of education: 14   Highest education level: Associate degree: academic program  Occupational History   Occupation: Systems developer    Comment: [employer]  Tobacco Use   Smoking status: Some Days    Types: Cigarettes, Cigars    Last attempt to quit: 01/31/2005    Years since quitting: 16.2   Smokeless tobacco: Never  Vaping Use   Vaping Use: Former  Substance and Sexual Activity   Alcohol use: Yes    Alcohol/week: 5.0 standard drinks    Types: 5 Cans of beer per week   Drug use: Not Currently   Sexual activity: Yes    Partners: Male    Birth  control/protection: I.U.D.  Other Topics Concern   Not on file  Social History Narrative   Live with husband and her brother, has 2 dogs and a bunny at home. Has stepdaughter that does not live with them.   Social Determinants of Health   Financial Resource Strain: Not on file  Food Insecurity: Not on file  Transportation Needs: Not on file  Physical Activity: Not on file  Stress: Not on file  Social Connections: Not on file   Family History: Family History  Problem Relation Age of Onset   Depression Mother    Anxiety disorder Mother    Heart disease Mother    Bipolar disorder Father    Hypertension Father    Diabetes Father    Depression Brother    Anxiety disorder Brother    Allergies: No Known Allergies Health Maintenance: Health Maintenance  Topic Date Due   Pneumococcal Vaccine 36-45 Years old (1 - PCV) Never done   COVID-19 Vaccine (3 - Pfizer risk series) 10/06/2020   INFLUENZA VACCINE  04/11/2021   PAP SMEAR-Modifier  12/16/2021   TETANUS/TDAP  12/01/2028   Hepatitis C Screening  Completed   HIV Screening  Completed   HPV VACCINES  Aged Out    HM Colonoscopy     This patient has no relevant Health Maintenance data.      Medications: Current Outpatient Medications on File Prior to Visit  Medication Sig Dispense Refill   amLODipine (  NORVASC) 10 MG tablet Take 1 tablet (10 mg total) by mouth daily. 90 tablet 3   levonorgestrel (MIRENA) 20 MCG/24HR IUD 1 Intra Uterine Device (1 each total) by Intrauterine route once for 1 dose. 1 each 0   losartan-hydrochlorothiazide (HYZAAR) 100-25 MG tablet Take 1 tablet by mouth daily. 90 tablet 1   No current facility-administered medications on file prior to visit.    Review of Systems: No headache, visual changes, nausea, vomiting, diarrhea, constipation, dizziness, abdominal pain, skin rash, fevers, chills, night sweats, swollen lymph nodes, weight loss, chest pain, body aches, joint swelling, muscle aches, shortness  of breath, mood changes, visual or auditory hallucinations reported.  Objective:   Vitals:   05/03/21 0919  BP: 118/72  Pulse: 76  Temp: 98.5 F (36.9 C)  SpO2: 98%   Vitals:   05/03/21 0919  Weight: 250 lb (113.4 kg)  Height: '5\' 3"'$  (1.6 m)   Body mass index is 44.29 kg/m.  General: Well Developed, well nourished, and in no acute distress.  Neuro: Alert and oriented x3, extra-ocular muscles intact, sensation grossly intact. Cranial nerves II through XII are intact, motor, sensory, and coordinative functions are all intact. HEENT: Normocephalic, atraumatic, pupils equal round reactive to light, neck supple, no masses, no lymphadenopathy, thyroid nonpalpable. Oropharynx, nasopharynx, external ear canals are unremarkable. Skin: Warm and dry, no rashes noted.  Cardiac: Regular rate and rhythm, no murmurs rubs or gallops. No peripheral edema. Pulses symmetric. Respiratory: Clear to auscultation bilaterally. Not using accessory muscles, speaking in full sentences.  Abdominal: Soft, nontender, nondistended, positive bowel sounds, no masses, no organomegaly. Musculoskeletal: Shoulder, elbow, wrist, hip, knee, ankle stable, and with full range of motion.  Female chaperone initials: BN present throughout the physical examination.  Impression and Recommendations:   The patient was counselled, risk factors were discussed, and anticipatory guidance given.  Encounter for annual physical exam Annual physical examination complete, risk stratifications ordered, anticipatory guidance provided. She plans to obtain flu vaccine in September, October, aware of 3rd booster for COVID, will decide this at a later time, due for pap next year through GYN. Will return in 1 year, contact for questions / refills   Orders & Medications Medications: No orders of the defined types were placed in this encounter.  Orders Placed This Encounter  Procedures   TSH Rfx on Abnormal to Free T4   CBC with  Differential/Platelet   Comprehensive metabolic panel   Lipid panel   Hemoglobin A1c   VITAMIN D 25 Hydroxy (Vit-D Deficiency, Fractures)     Return in about 1 year (around 05/03/2022) for annual physical.    Montel Culver, MD   [place]

## 2021-05-03 NOTE — Patient Instructions (Signed)
-   Obtain fasting labs with orders provided - Review information provided - Return in 1 year for physical - Contact us for questions between now and then

## 2021-05-03 NOTE — Assessment & Plan Note (Signed)
Annual physical examination complete, risk stratifications ordered, anticipatory guidance provided. She plans to obtain flu vaccine in September, October, aware of 3rd booster for COVID, will decide this at a later time, due for pap next year through GYN. Will return in 1 year, contact for questions / refills

## 2021-05-23 DIAGNOSIS — F603 Borderline personality disorder: Secondary | ICD-10-CM | POA: Diagnosis not present

## 2021-05-23 DIAGNOSIS — F411 Generalized anxiety disorder: Secondary | ICD-10-CM | POA: Diagnosis not present

## 2021-05-23 DIAGNOSIS — F322 Major depressive disorder, single episode, severe without psychotic features: Secondary | ICD-10-CM | POA: Diagnosis not present

## 2021-05-30 ENCOUNTER — Ambulatory Visit: Payer: Self-pay | Admitting: Family Medicine

## 2021-06-06 DIAGNOSIS — F603 Borderline personality disorder: Secondary | ICD-10-CM | POA: Diagnosis not present

## 2021-06-06 DIAGNOSIS — F411 Generalized anxiety disorder: Secondary | ICD-10-CM | POA: Diagnosis not present

## 2021-06-06 DIAGNOSIS — F322 Major depressive disorder, single episode, severe without psychotic features: Secondary | ICD-10-CM | POA: Diagnosis not present

## 2021-06-14 ENCOUNTER — Encounter: Payer: Self-pay | Admitting: Family Medicine

## 2021-06-14 DIAGNOSIS — G8929 Other chronic pain: Secondary | ICD-10-CM

## 2021-06-14 DIAGNOSIS — M25512 Pain in left shoulder: Secondary | ICD-10-CM

## 2021-06-14 NOTE — Telephone Encounter (Signed)
Please review.  KP

## 2021-06-14 NOTE — Telephone Encounter (Signed)
Also, patient has not obtained blood work yet.

## 2021-06-14 NOTE — Telephone Encounter (Signed)
Brinty, please place x-ray orders for both shoulders and schedule a 40 minute ortho visit.  Additionally, please help coordinate her getting the labs ordered at the last visit, ideally so we can review them at a separate visit.

## 2021-06-14 NOTE — Telephone Encounter (Signed)
Bilateral shoulder X-rays ordered.  Please schedule 40 min new ortho at patient's convenience and advise patient to obtain fasting labs and X-Rays prior to appointment.

## 2021-06-15 ENCOUNTER — Other Ambulatory Visit: Payer: Self-pay

## 2021-06-15 ENCOUNTER — Ambulatory Visit
Admission: RE | Admit: 2021-06-15 | Discharge: 2021-06-15 | Disposition: A | Discharge status: Home / Self Care | Payer: BC Managed Care – PPO | Attending: Family Medicine | Admitting: Family Medicine

## 2021-06-15 ENCOUNTER — Ambulatory Visit
Admission: RE | Admit: 2021-06-15 | Discharge: 2021-06-15 | Disposition: A | Discharge status: Home / Self Care | Payer: BC Managed Care – PPO | Source: Ambulatory Visit | Attending: Family Medicine | Admitting: Family Medicine

## 2021-06-15 DIAGNOSIS — M25511 Pain in right shoulder: Secondary | ICD-10-CM

## 2021-06-15 DIAGNOSIS — G8929 Other chronic pain: Secondary | ICD-10-CM | POA: Diagnosis not present

## 2021-06-15 DIAGNOSIS — M25512 Pain in left shoulder: Secondary | ICD-10-CM | POA: Diagnosis not present

## 2021-06-15 NOTE — Telephone Encounter (Signed)
Patient will be coming in today to pick up labs

## 2021-06-17 DIAGNOSIS — Z0000 Encounter for general adult medical examination without abnormal findings: Secondary | ICD-10-CM | POA: Diagnosis not present

## 2021-06-17 DIAGNOSIS — R7989 Other specified abnormal findings of blood chemistry: Secondary | ICD-10-CM | POA: Diagnosis not present

## 2021-06-17 DIAGNOSIS — Z13220 Encounter for screening for lipoid disorders: Secondary | ICD-10-CM | POA: Diagnosis not present

## 2021-06-17 DIAGNOSIS — R7309 Other abnormal glucose: Secondary | ICD-10-CM | POA: Diagnosis not present

## 2021-06-18 LAB — CBC WITH DIFFERENTIAL/PLATELET
Basophils Absolute: 0 10*3/uL (ref 0.0–0.2)
Basos: 1 %
EOS (ABSOLUTE): 0.3 10*3/uL (ref 0.0–0.4)
Eos: 4 %
Hematocrit: 40.5 % (ref 34.0–46.6)
Hemoglobin: 13.4 g/dL (ref 11.1–15.9)
Immature Grans (Abs): 0 10*3/uL (ref 0.0–0.1)
Immature Granulocytes: 0 %
Lymphocytes Absolute: 2.6 10*3/uL (ref 0.7–3.1)
Lymphs: 38 %
MCH: 27.5 pg (ref 26.6–33.0)
MCHC: 33.1 g/dL (ref 31.5–35.7)
MCV: 83 fL (ref 79–97)
Monocytes Absolute: 0.4 10*3/uL (ref 0.1–0.9)
Monocytes: 6 %
Neutrophils Absolute: 3.5 10*3/uL (ref 1.4–7.0)
Neutrophils: 51 %
Platelets: 375 10*3/uL (ref 150–450)
RBC: 4.88 x10E6/uL (ref 3.77–5.28)
RDW: 13.1 % (ref 11.7–15.4)
WBC: 6.8 10*3/uL (ref 3.4–10.8)

## 2021-06-18 LAB — COMPREHENSIVE METABOLIC PANEL
ALT: 26 IU/L (ref 0–32)
AST: 14 IU/L (ref 0–40)
Albumin/Globulin Ratio: 1.7 (ref 1.2–2.2)
Albumin: 4.4 g/dL (ref 3.8–4.8)
Alkaline Phosphatase: 75 IU/L (ref 44–121)
BUN/Creatinine Ratio: 20 (ref 9–23)
BUN: 16 mg/dL (ref 6–20)
Bilirubin Total: 0.3 mg/dL (ref 0.0–1.2)
CO2: 23 mmol/L (ref 20–29)
Calcium: 9.3 mg/dL (ref 8.7–10.2)
Chloride: 99 mmol/L (ref 96–106)
Creatinine, Ser: 0.8 mg/dL (ref 0.57–1.00)
Globulin, Total: 2.6 g/dL (ref 1.5–4.5)
Glucose: 111 mg/dL — ABNORMAL HIGH (ref 70–99)
Potassium: 3.8 mmol/L (ref 3.5–5.2)
Sodium: 137 mmol/L (ref 134–144)
Total Protein: 7 g/dL (ref 6.0–8.5)
eGFR: 98 mL/min/{1.73_m2} (ref >59)

## 2021-06-18 LAB — LIPID PANEL
Chol/HDL Ratio: 3.5 ratio (ref 0.0–4.4)
Cholesterol, Total: 173 mg/dL (ref 100–199)
HDL: 50 mg/dL (ref >39)
LDL Chol Calc (NIH): 94 mg/dL (ref 0–99)
Triglycerides: 169 mg/dL — ABNORMAL HIGH (ref 0–149)
VLDL Cholesterol Cal: 29 mg/dL (ref 5–40)

## 2021-06-18 LAB — VITAMIN D 25 HYDROXY (VIT D DEFICIENCY, FRACTURES): Vit D, 25-Hydroxy: 15 ng/mL — ABNORMAL LOW (ref 30.0–100.0)

## 2021-06-18 LAB — TSH RFX ON ABNORMAL TO FREE T4: TSH: 1.06 u[IU]/mL (ref 0.450–4.500)

## 2021-06-18 LAB — HEMOGLOBIN A1C
Est. average glucose Bld gHb Est-mCnc: 126 mg/dL
Hgb A1c MFr Bld: 6 % — ABNORMAL HIGH (ref 4.8–5.6)

## 2021-06-20 DIAGNOSIS — F411 Generalized anxiety disorder: Secondary | ICD-10-CM | POA: Diagnosis not present

## 2021-06-20 DIAGNOSIS — F603 Borderline personality disorder: Secondary | ICD-10-CM | POA: Diagnosis not present

## 2021-06-20 DIAGNOSIS — F322 Major depressive disorder, single episode, severe without psychotic features: Secondary | ICD-10-CM | POA: Diagnosis not present

## 2021-06-22 ENCOUNTER — Other Ambulatory Visit: Payer: Self-pay

## 2021-06-22 ENCOUNTER — Encounter: Payer: Self-pay | Admitting: Family Medicine

## 2021-06-22 ENCOUNTER — Ambulatory Visit (INDEPENDENT_AMBULATORY_CARE_PROVIDER_SITE_OTHER): Payer: BC Managed Care – PPO | Admitting: Family Medicine

## 2021-06-22 VITALS — BP 122/80 | HR 78 | Ht 63.0 in | Wt 254.0 lb

## 2021-06-22 DIAGNOSIS — E559 Vitamin D deficiency, unspecified: Secondary | ICD-10-CM

## 2021-06-22 DIAGNOSIS — M9901 Segmental and somatic dysfunction of cervical region: Secondary | ICD-10-CM

## 2021-06-22 DIAGNOSIS — M7542 Impingement syndrome of left shoulder: Secondary | ICD-10-CM | POA: Insufficient documentation

## 2021-06-22 DIAGNOSIS — M5412 Radiculopathy, cervical region: Secondary | ICD-10-CM | POA: Insufficient documentation

## 2021-06-22 MED ORDER — DICLOFENAC SODIUM 75 MG PO TBEC: 75.0000 mg | DELAYED_RELEASE_TABLET | Freq: Two times a day (BID) | ORAL | 1 refills | Status: DC

## 2021-06-22 MED ORDER — CYCLOBENZAPRINE HCL 10 MG PO TABS: 10.0000 mg | ORAL_TABLET | Freq: Every day | ORAL | 1 refills | Status: DC

## 2021-06-22 MED ORDER — VITAMIN D (ERGOCALCIFEROL) 1.25 MG (50000 UNIT) PO CAPS: 50000.0000 [IU] | ORAL_CAPSULE | ORAL | 0 refills | Status: DC

## 2021-06-22 NOTE — Progress Notes (Signed)
Primary Care / Sports Medicine Office Visit  Patient Information:  Patient ID: WILSON SAMPLE, female DOB: [birth year] Age: 35 y.o. MRN: [id number]   Amanda Nash is a pleasant 35 y.o. female presenting with the following:  Chief Complaint  Patient presents with   Shoulder Pain    Bilateral; left>right; since last Monday, 06/13/21; per patient history of arthritis; X-Ray 06/15/21; taking Tylenol arthritis, ibuprofen, heat/cold, and diclofenac topical gel with no relief; right-handedness 4/10 pain    Review of Systems pertinent details above   Patient Active Problem List   Diagnosis Date Noted   Segmental and somatic dysfunction of cervical region 06/22/2021   Rotator cuff impingement syndrome of left shoulder 06/22/2021   Encounter for annual physical exam 05/03/2021   Carpal tunnel syndrome, bilateral 03/29/2021   Primary hypertension 10/28/2020   Chronic pelvic pain in female 02/26/2019   Prediabetes 12/24/2018   Thrombocytosis 12/24/2018   Anemia 12/24/2018   Dysmenorrhea 12/02/2018   Menorrhagia with irregular cycle 12/02/2018   Morbid obesity ([place]) 12/02/2018   Borderline personality disorder ([place]) 09/27/2016   Past Medical History:  Diagnosis Date   Anxiety    Bipolar 1 disorder ([place]) 2016   Depression    Hypertension    Outpatient Encounter Medications as of 06/22/2021  Medication Sig   amLODipine (NORVASC) 10 MG tablet Take 1 tablet (10 mg total) by mouth daily.   cyclobenzaprine (FLEXERIL) 10 MG tablet Take 1 tablet (10 mg total) by mouth at bedtime.   diclofenac (VOLTAREN) 75 MG EC tablet Take 1 tablet (75 mg total) by mouth 2 (two) times daily.   levonorgestrel (MIRENA) 20 MCG/24HR IUD 1 Intra Uterine Device (1 each total) by Intrauterine route once for 1 dose.   losartan-hydrochlorothiazide (HYZAAR) 100-25 MG tablet Take 1 tablet by mouth daily.   Vitamin D, Ergocalciferol, (DRISDOL) 1.25 MG (50000 UNIT) CAPS capsule Take 1 capsule (50,000 Units total)  by mouth every 7 (seven) days.   No facility-administered encounter medications on file as of 06/22/2021.   Past Surgical History:  Procedure Laterality Date   DILATATION & CURRETTAGE/HYSTEROSCOPY WITH RESECTOCOPE  2011    Vitals:   06/22/21 0800  BP: 122/80  Pulse: 78  SpO2: 95%   Vitals:   06/22/21 0800  Weight: 254 lb (115.2 kg)  Height: 5\' 3"  (1.6 m)   Body mass index is 44.99 kg/m.  DG Shoulder Right  Result Date: 06/16/2021 CLINICAL DATA:  Bilateral shoulder pain, right greater than left. History of arthritis. No known injury. EXAM: RIGHT SHOULDER - 2+ VIEW COMPARISON:  None. FINDINGS: There is no evidence of fracture or dislocation. There is no evidence of arthropathy or other focal bone abnormality. Soft tissues are unremarkable. IMPRESSION: Negative. Electronically Signed   By: Ileana Roup M.D.   On: 06/16/2021 13:35   DG Shoulder Left  Result Date: 06/16/2021 CLINICAL DATA:  Left shoulder pain. EXAM: LEFT SHOULDER - 2+ VIEW COMPARISON:  None. FINDINGS: There is no evidence of fracture or dislocation. There is no evidence of arthropathy or other focal bone abnormality. Soft tissues are unremarkable. IMPRESSION: Negative. Electronically Signed   By: Titus Dubin M.D.   On: 06/16/2021 13:38     Independent interpretation of notes and tests performed by another provider:   Independent interpretation of bilateral shoulders reveals no glenohumeral osteoarthritis, type I acromion bilaterally, no AC arthropathy, alignment maintained.  Independent interpretation of cervical spine x-rays dated 12/31/2020 reveal loss of the expected cervical lordosis, focal subtle  intervertebral narrowing at C5-6 and C4-5 where there is posterior endplate osteophyte formation and subtle anterior osteophyte formation, no foraminal narrowing noted, no acute osseous processes noted.  Procedures performed:   None  Pertinent History, Exam, Impression, and Recommendations:   Segmental and  somatic dysfunction of cervical region Patient with chronic description of paraspinal cervical tenderness, spasms, headaches, no radiating symptoms but does have bilateral hand paresthesias with comorbid diagnosis of carpal tunnel syndrome.  Treatments have included OTC medications and massage with limited response.  Has had x-rays in the past for this issue.  Physical examination reveals full though painful range of motion, significant right greater than left paraspinal tenderness with noted spasm with focality to the right levator scapulae, left upper trapezius.  Upper extremity strength is maintained, sensorimotor intact, negative Spurling's bilaterally.  Her x-rays were reviewed revealing focal intervertebral subtle narrowing and cortical roughening/osteophyte formation, loss of lordosis.  This, in conjunction with her physical exam findings is most consistent with upper crossed syndrome, the nature of this condition and treatment strategies were reviewed with the patient.  She is amenable to a course of scheduled anti-inflammatories, muscle relaxers, transition to as needed dosing, formal physical therapy, and follow-up in 6 weeks time.  If suboptimal response noted, can discuss local injections, advanced imaging, escalation or alternate pharmacological treatments.  Rotator cuff impingement syndrome of left shoulder Patient with several day history of left posterior shoulder pain, atraumatic in onset, no overuse related, this in the setting of known chronic cervical issues.  Patient states that she noted significant pain with overhead and reaching behind her back activities, no radiation of symptoms distally, nighttime pain.  Pain control with rest, OTC medications, has slightly improved with these treatments.  Physical examination of bilateral shoulders reveals full range of motion with mild pain during internal rotation/extension on the left alone, rotator cuff testing is 5/5 bilaterally, there is  pain elicited with isolated left supraspinatus testing, positive impingement on the left, speeds equivocal on the left, otherwise remainder and contralateral provocative testing benign.  Patient with features consistent with supraspinatus tendinopathy and associated impingement syndrome in the setting of upper crossed syndrome.  She will be placed on scheduled diclofenac 75 mg twice daily x2 weeks then as needed, cyclobenzaprine 10 mg nightly x2 weeks then as needed, home-based and formal physical therapy, follow-up in 6 weeks time.  She was advised to contact her office for any symptoms that prevent her from progressing with physical therapy, local injections, advanced imaging to all be considered at that time.    Orders & Medications Meds ordered this encounter  Medications   diclofenac (VOLTAREN) 75 MG EC tablet    Sig: Take 1 tablet (75 mg total) by mouth 2 (two) times daily.    Dispense:  60 tablet    Refill:  1   cyclobenzaprine (FLEXERIL) 10 MG tablet    Sig: Take 1 tablet (10 mg total) by mouth at bedtime.    Dispense:  30 tablet    Refill:  1   Vitamin D, Ergocalciferol, (DRISDOL) 1.25 MG (50000 UNIT) CAPS capsule    Sig: Take 1 capsule (50,000 Units total) by mouth every 7 (seven) days.    Dispense:  8 capsule    Refill:  0   Orders Placed This Encounter  Procedures   Ambulatory referral to Physical Therapy     Return in about 6 weeks (around 08/03/2021) for f/u neck and shoulders.     Montel Culver, MD   Primary  [place]

## 2021-06-22 NOTE — Assessment & Plan Note (Signed)
>>  ASSESSMENT AND PLAN FOR SEGMENTAL AND SOMATIC DYSFUNCTION OF CERVICAL REGION WRITTEN ON 06/22/2021  9:56 AM BY Dmitriy Gair J, MD  Patient with chronic description of paraspinal cervical tenderness, spasms, headaches, no radiating symptoms but does have bilateral hand paresthesias with comorbid diagnosis of carpal tunnel syndrome.  Treatments have included OTC medications and massage with limited response.  Has had x-rays in the past for this issue.  Physical examination reveals full though painful range of motion, significant right greater than left paraspinal tenderness with noted spasm with focality to the right levator scapulae, left upper trapezius.  Upper extremity strength is maintained, sensorimotor intact, negative Spurling's bilaterally.  Her x-rays were reviewed revealing focal intervertebral subtle narrowing and cortical roughening/osteophyte formation, loss of lordosis.  This, in conjunction with her physical exam findings is most consistent with upper crossed syndrome, the nature of this condition and treatment strategies were reviewed with the patient.  She is amenable to a course of scheduled anti-inflammatories, muscle relaxers, transition to as needed dosing, formal physical therapy, and follow-up in 6 weeks time.  If suboptimal response noted, can discuss local injections, advanced imaging, escalation or alternate pharmacological treatments.

## 2021-06-22 NOTE — Patient Instructions (Signed)
-   Start diclofenac 75 mg twice daily x2 weeks, after 2 weeks continue twice daily on an as-needed basis - Start nightly cyclobenzaprine 10 mg, side effects with drowsiness x2 weeks, after 2 weeks continue nightly on as-needed basis - Start physical therapy with referral provided, they will provide home exercises for her to perform daily - Return for follow-up in 6 weeks, contact her office for questions between now and then

## 2021-06-22 NOTE — Assessment & Plan Note (Signed)
Patient with several day history of left posterior shoulder pain, atraumatic in onset, no overuse related, this in the setting of known chronic cervical issues.  Patient states that she noted significant pain with overhead and reaching behind her back activities, no radiation of symptoms distally, nighttime pain.  Pain control with rest, OTC medications, has slightly improved with these treatments.  Physical examination of bilateral shoulders reveals full range of motion with mild pain during internal rotation/extension on the left alone, rotator cuff testing is 5/5 bilaterally, there is pain elicited with isolated left supraspinatus testing, positive impingement on the left, speeds equivocal on the left, otherwise remainder and contralateral provocative testing benign.  Patient with features consistent with supraspinatus tendinopathy and associated impingement syndrome in the setting of upper crossed syndrome.  She will be placed on scheduled diclofenac 75 mg twice daily x2 weeks then as needed, cyclobenzaprine 10 mg nightly x2 weeks then as needed, home-based and formal physical therapy, follow-up in 6 weeks time.  She was advised to contact her office for any symptoms that prevent her from progressing with physical therapy, local injections, advanced imaging to all be considered at that time.

## 2021-06-22 NOTE — Assessment & Plan Note (Signed)
Patient with chronic description of paraspinal cervical tenderness, spasms, headaches, no radiating symptoms but does have bilateral hand paresthesias with comorbid diagnosis of carpal tunnel syndrome.  Treatments have included OTC medications and massage with limited response.  Has had x-rays in the past for this issue.  Physical examination reveals full though painful range of motion, significant right greater than left paraspinal tenderness with noted spasm with focality to the right levator scapulae, left upper trapezius.  Upper extremity strength is maintained, sensorimotor intact, negative Spurling's bilaterally.  Her x-rays were reviewed revealing focal intervertebral subtle narrowing and cortical roughening/osteophyte formation, loss of lordosis.  This, in conjunction with her physical exam findings is most consistent with upper crossed syndrome, the nature of this condition and treatment strategies were reviewed with the patient.  She is amenable to a course of scheduled anti-inflammatories, muscle relaxers, transition to as needed dosing, formal physical therapy, and follow-up in 6 weeks time.  If suboptimal response noted, can discuss local injections, advanced imaging, escalation or alternate pharmacological treatments.

## 2021-06-25 ENCOUNTER — Encounter: Payer: Self-pay | Admitting: Family Medicine

## 2021-06-27 ENCOUNTER — Ambulatory Visit: Payer: Self-pay | Admitting: Family Medicine

## 2021-07-01 DIAGNOSIS — F4312 Post-traumatic stress disorder, chronic: Secondary | ICD-10-CM | POA: Diagnosis not present

## 2021-07-01 DIAGNOSIS — F411 Generalized anxiety disorder: Secondary | ICD-10-CM | POA: Diagnosis not present

## 2021-07-01 DIAGNOSIS — F3162 Bipolar disorder, current episode mixed, moderate: Secondary | ICD-10-CM | POA: Diagnosis not present

## 2021-07-15 DIAGNOSIS — F4312 Post-traumatic stress disorder, chronic: Secondary | ICD-10-CM | POA: Diagnosis not present

## 2021-07-15 DIAGNOSIS — F3162 Bipolar disorder, current episode mixed, moderate: Secondary | ICD-10-CM | POA: Diagnosis not present

## 2021-08-08 DIAGNOSIS — F411 Generalized anxiety disorder: Secondary | ICD-10-CM | POA: Diagnosis not present

## 2021-08-08 DIAGNOSIS — F322 Major depressive disorder, single episode, severe without psychotic features: Secondary | ICD-10-CM | POA: Diagnosis not present

## 2021-08-08 DIAGNOSIS — F603 Borderline personality disorder: Secondary | ICD-10-CM | POA: Diagnosis not present

## 2021-08-15 DIAGNOSIS — F3162 Bipolar disorder, current episode mixed, moderate: Secondary | ICD-10-CM | POA: Diagnosis not present

## 2021-08-15 DIAGNOSIS — F4312 Post-traumatic stress disorder, chronic: Secondary | ICD-10-CM | POA: Diagnosis not present

## 2021-08-16 ENCOUNTER — Ambulatory Visit: Payer: BC Managed Care – PPO | Admitting: Family Medicine

## 2021-08-23 ENCOUNTER — Encounter: Payer: Self-pay | Admitting: Family Medicine

## 2021-08-23 ENCOUNTER — Ambulatory Visit (INDEPENDENT_AMBULATORY_CARE_PROVIDER_SITE_OTHER): Payer: BC Managed Care – PPO | Admitting: Family Medicine

## 2021-08-23 ENCOUNTER — Other Ambulatory Visit: Payer: Self-pay

## 2021-08-23 VITALS — BP 118/92 | HR 76 | Ht 63.0 in | Wt 263.0 lb

## 2021-08-23 DIAGNOSIS — M9901 Segmental and somatic dysfunction of cervical region: Secondary | ICD-10-CM

## 2021-08-23 DIAGNOSIS — M5412 Radiculopathy, cervical region: Secondary | ICD-10-CM | POA: Insufficient documentation

## 2021-08-23 DIAGNOSIS — M7542 Impingement syndrome of left shoulder: Secondary | ICD-10-CM | POA: Diagnosis not present

## 2021-08-23 DIAGNOSIS — B070 Plantar wart: Secondary | ICD-10-CM

## 2021-08-23 MED ORDER — DICLOFENAC SODIUM 75 MG PO TBEC
75.0000 mg | DELAYED_RELEASE_TABLET | Freq: Two times a day (BID) | ORAL | 0 refills | Status: DC | PRN
Start: 2021-08-23 — End: 2021-12-05

## 2021-08-23 MED ORDER — CYCLOBENZAPRINE HCL 10 MG PO TABS: 10.0000 mg | ORAL_TABLET | Freq: Every evening | ORAL | 0 refills | Status: DC | PRN

## 2021-08-23 MED ORDER — DULOXETINE HCL 30 MG PO CPEP
ORAL_CAPSULE | ORAL | 0 refills | Status: DC
Start: 2021-08-23 — End: 2021-08-29

## 2021-08-23 NOTE — Assessment & Plan Note (Signed)
Chronic, stable condition, can be considered secondary/compensatory given more symptomatic cervical spine symptoms.

## 2021-08-23 NOTE — Progress Notes (Signed)
°  ° °  Primary Care / Sports Medicine Office Visit  Patient Information:  Patient ID: Amanda Nash, female DOB: Jan 30, 1986 Age: 35 y.o. MRN: [id number]   Amanda Nash is a pleasant 35 y.o. female presenting with the following:  Chief Complaint  Patient presents with   Segmental and somatic dysfunction of cervical region    Better since last visit; bilateral hand numbness associated; interesting in injection per patient; 2/10 pain    Patient Active Problem List   Diagnosis Date Noted   Cervical radiculopathy 08/23/2021   Bilateral plantar wart 08/23/2021   Segmental and somatic dysfunction of cervical region 06/22/2021   Rotator cuff impingement syndrome of left shoulder 06/22/2021   Encounter for annual physical exam 05/03/2021   Carpal tunnel syndrome, bilateral 03/29/2021   Primary hypertension 10/28/2020   Chronic pelvic pain in female 02/26/2019   Prediabetes 12/24/2018   Thrombocytosis 12/24/2018   Anemia 12/24/2018   Dysmenorrhea 12/02/2018   Menorrhagia with irregular cycle 12/02/2018   Morbid obesity ([place]) 12/02/2018   Borderline personality disorder ([place]) 09/27/2016    Vitals:   08/23/21 1525  BP: (!) 118/92  Pulse: 76  SpO2: 98%   Vitals:   08/23/21 1525  Weight: 263 lb (119.3 kg)  Height: 5\' 3"  (1.6 m)   Body mass index is 46.59 kg/m.  No results found.   Independent interpretation of notes and tests performed by another provider:   None  Procedures performed:   None  Pertinent History, Exam, Impression, and Recommendations:   Cervical radiculopathy Stated symptoms of left greater than right paresthesias coupled with examination findings today, which are reassuring from a carpal tunnel standpoint, are most consistent with cervical etiology for her radiculopathy. I have advised next steps and she is amenable to initiation of duloxetine with titration at 2 weeks, continued current medications and home PT, MRI without contrast of cervical spine,  and referral to Pain & Spine.  Segmental and somatic dysfunction of cervical region See additional assessment(s) for plan details.  Rotator cuff impingement syndrome of left shoulder Chronic, stable condition, can be considered secondary/compensatory given more symptomatic cervical spine symptoms.  Bilateral plantar wart Chronic issue involving bilateral plantar feet, referral to podiatry placed for additional evaluation and management.   Orders & Medications Meds ordered this encounter  Medications   DULoxetine (CYMBALTA) 30 MG capsule    Sig: Take 1 capsule (30 mg total) by mouth every evening for 14 days, THEN 2 capsules (60 mg total) every evening for 16 days.    Dispense:  46 capsule    Refill:  0   cyclobenzaprine (FLEXERIL) 10 MG tablet    Sig: Take 1 tablet (10 mg total) by mouth at bedtime as needed for muscle spasms.    Dispense:  30 tablet    Refill:  0   diclofenac (VOLTAREN) 75 MG EC tablet    Sig: Take 1 tablet (75 mg total) by mouth 2 (two) times daily as needed.    Dispense:  60 tablet    Refill:  0   Orders Placed This Encounter  Procedures   MR Cervical Spine Wo Contrast   Ambulatory referral to Podiatry   Ambulatory referral to Anesthesiology     Return if symptoms worsen or fail to improve.     Montel Culver, MD   Primary Care Sports Medicine [facility]

## 2021-08-23 NOTE — Assessment & Plan Note (Signed)
See additional assessment(s) for plan details. 

## 2021-08-23 NOTE — Assessment & Plan Note (Addendum)
>>  ASSESSMENT AND PLAN FOR CERVICAL RADICULOPATHY WRITTEN ON 08/23/2021  5:51 PM BY [name] Wisnieski J, MD  Stated symptoms of left greater than right paresthesias coupled with examination findings today, which are reassuring from a carpal tunnel standpoint, are most consistent with cervical etiology for her radiculopathy. I have advised next steps and she is amenable to initiation of duloxetine  with titration at 2 weeks, continued current medications and home PT, MRI without contrast of cervical spine, and referral to Pain & Spine.   >>ASSESSMENT AND PLAN FOR SEGMENTAL AND SOMATIC DYSFUNCTION OF CERVICAL REGION WRITTEN ON 08/23/2021  5:51 PM BY Skylier Kretschmer J, MD  See additional assessment(s) for plan details.

## 2021-08-23 NOTE — Assessment & Plan Note (Signed)
Chronic issue involving bilateral plantar feet, referral to podiatry placed for additional evaluation and management.

## 2021-08-23 NOTE — Patient Instructions (Addendum)
-   Continue cyclobenzaprine and diclofenac on an as-needed basis - Start Cymbalta 30 mg nightly x2 weeks, after 2 weeks increase to 60 mg nightly (2 capsules) - Continue with home exercises instructed by PT - Referral coordinator will contact you to schedule follow-up with pain and spine group and MRI scheduling, additionally to coordinate Podiatry follow-up - Contact our office in 4 weeks to provide a status update

## 2021-08-24 DIAGNOSIS — F322 Major depressive disorder, single episode, severe without psychotic features: Secondary | ICD-10-CM | POA: Diagnosis not present

## 2021-08-24 DIAGNOSIS — F603 Borderline personality disorder: Secondary | ICD-10-CM | POA: Diagnosis not present

## 2021-08-24 DIAGNOSIS — F411 Generalized anxiety disorder: Secondary | ICD-10-CM | POA: Diagnosis not present

## 2021-08-29 ENCOUNTER — Other Ambulatory Visit: Payer: Self-pay

## 2021-08-29 DIAGNOSIS — M9901 Segmental and somatic dysfunction of cervical region: Secondary | ICD-10-CM

## 2021-08-29 DIAGNOSIS — M7542 Impingement syndrome of left shoulder: Secondary | ICD-10-CM

## 2021-08-29 DIAGNOSIS — M5412 Radiculopathy, cervical region: Secondary | ICD-10-CM

## 2021-08-29 MED ORDER — DULOXETINE HCL 60 MG PO CPEP: 60.0000 mg | ORAL_CAPSULE | Freq: Every day | ORAL | 0 refills | Status: DC

## 2021-08-29 MED ORDER — DULOXETINE HCL 30 MG PO CPEP: 30.0000 mg | ORAL_CAPSULE | Freq: Every evening | ORAL | 0 refills | Status: DC

## 2021-08-30 ENCOUNTER — Ambulatory Visit: Payer: BC Managed Care – PPO

## 2021-08-30 ENCOUNTER — Other Ambulatory Visit: Payer: Self-pay

## 2021-08-30 ENCOUNTER — Ambulatory Visit (INDEPENDENT_AMBULATORY_CARE_PROVIDER_SITE_OTHER): Payer: Self-pay | Admitting: Podiatry

## 2021-08-30 DIAGNOSIS — L989 Disorder of the skin and subcutaneous tissue, unspecified: Secondary | ICD-10-CM

## 2021-08-30 NOTE — Progress Notes (Signed)
° °  HPI: 35 y.o. female presenting today as a new patient for evaluation of possible plantar warts to the bilateral feet that is been present for about 2-3 years.  Patient states that she has been able to manage the warts at home with shaving them down.  Over the past few months it has become a lot more painful.  She experiences burning sensations near the lesions.  She presents for further treatment and evaluation  Past Medical History:  Diagnosis Date   Anxiety    Bipolar 1 disorder ([place]) 2016   Depression    Hypertension     Past Surgical History:  Procedure Laterality Date   DILATATION & CURRETTAGE/HYSTEROSCOPY WITH RESECTOCOPE  2011    No Known Allergies   Physical Exam: General: The patient is alert and oriented x3 in no acute distress.  Dermatology: There are some hyperkeratotic skin lesions noted to the plantar aspect of the fifth MTP joint bilateral as well as the right plantar heel with pinpoint bleeding upon debridement.  Findings consistent with a plantar verruca lesions  Vascular: Palpable pedal pulses bilaterally. Capillary refill within normal limits.  Negative for any significant edema or erythema  Neurological: Light touch and protective threshold grossly intact  Musculoskeletal Exam: No pedal deformities noted   Assessment: 1.  Plantar verruca plantar fifth MTP joint and right plantar heel   Plan of Care:  1. Patient evaluated.  2.  Light excisional debridement of the hyperkeratotic callus tissue around the plantar verruca was performed using a 312 scalpel.  Pinpoint bleeding noted upon debridement 3.  Salicylic acid applied.  Recommend salicylic acid daily x4 weeks.  Sample salicylic acid was provided for the patient today. 4.  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 Edrick Kins, DPM    [address],  [address]                 Office [phone]  Fax [phone]

## 2021-09-08 DIAGNOSIS — Z20822 Contact with and (suspected) exposure to covid-19: Secondary | ICD-10-CM | POA: Diagnosis not present

## 2021-09-15 ENCOUNTER — Encounter: Payer: Self-pay | Admitting: Podiatry

## 2021-09-16 ENCOUNTER — Encounter: Payer: Self-pay | Admitting: Family Medicine

## 2021-09-16 NOTE — Telephone Encounter (Signed)
Please advise 

## 2021-09-30 ENCOUNTER — Ambulatory Visit: Payer: Managed Care, Other (non HMO) | Admitting: Podiatry

## 2021-09-30 ENCOUNTER — Encounter: Payer: Self-pay | Admitting: Podiatry

## 2021-09-30 ENCOUNTER — Other Ambulatory Visit: Payer: Self-pay

## 2021-09-30 DIAGNOSIS — B070 Plantar wart: Secondary | ICD-10-CM | POA: Diagnosis not present

## 2021-09-30 NOTE — Progress Notes (Signed)
° °  HPI: 36 y.o. female presenting today for follow-up evaluation of plantar warts to the bilateral feet.  Patient states that she is doing much better.  She was Acet applying the salicylic acid with improvement.  She presents for follow-up treatment and evaluation  Past Medical History:  Diagnosis Date   Anxiety    Bipolar 1 disorder ([place]) 2016   Depression    Hypertension     Past Surgical History:  Procedure Laterality Date   DILATATION & CURRETTAGE/HYSTEROSCOPY WITH RESECTOCOPE  2011    No Known Allergies   Physical Exam: General: The patient is alert and oriented x3 in no acute distress.  Dermatology: Skin is warm, dry and supple bilateral lower extremities. Negative for open lesions or macerations.  The areas where there were plantar verruca does appear to be healthy viable skin.  Complete resolution of the plantar verruca and wart lesions  Vascular: Palpable pedal pulses bilaterally. Capillary refill within normal limits.  Negative for any significant edema or erythema  Neurological: Light touch and protective threshold grossly intact  Musculoskeletal Exam: No pedal deformities noted   Assessment: 1.  Plantar wart bilateral; resolved   Plan of Care:  1. Patient evaluated. 2.  Light debridement of the area was performed using a 312 scalpel without incident or bleeding.  Overall the patient is doing very well 3.  Recommend good supportive shoes and insoles.  Advised against going barefoot 4.  Continue OTC salicylic acid that was provided last visit as needed 5.  Return to clinic as needed      Edrick Kins, DPM [facility]  Dr. Edrick Kins, DPM    [address], [place] [address]                Office [phone]  Fax [phone]

## 2021-10-06 ENCOUNTER — Other Ambulatory Visit: Payer: Self-pay | Admitting: Family Medicine

## 2021-10-06 DIAGNOSIS — I10 Essential (primary) hypertension: Secondary | ICD-10-CM

## 2021-10-06 NOTE — Telephone Encounter (Signed)
Per note from OV of 05/03/2021, pt is to [facility] in 1 year. Requested Prescriptions  Pending Prescriptions Disp Refills   losartan-hydrochlorothiazide (HYZAAR) 100-25 MG tablet [Pharmacy Med Name: Losartan Potassium-HCTZ 100-25 MG Oral Tablet] 90 tablet 1    Sig: Take 1 tablet by mouth once daily     Cardiovascular: ARB + Diuretic Combos Failed - 10/06/2021  8:34 PM      Failed - Last BP in normal range    BP Readings from Last 1 Encounters:  08/23/21 (!) 118/92         Passed - K in normal range and within 180 days    Potassium  Date Value Ref Range Status  06/17/2021 3.8 3.5 - 5.2 mmol/L Final         Passed - Na in normal range and within 180 days    Sodium  Date Value Ref Range Status  06/17/2021 137 134 - 144 mmol/L Final         Passed - Cr in normal range and within 180 days    Creat  Date Value Ref Range Status  05/22/2019 0.78 0.50 - 1.10 mg/dL Final   Creatinine, Ser  Date Value Ref Range Status  06/17/2021 0.80 0.57 - 1.00 mg/dL Final         Passed - Ca in normal range and within 180 days    Calcium  Date Value Ref Range Status  06/17/2021 9.3 8.7 - 10.2 mg/dL Final         Passed - Patient is not pregnant      Passed - Valid encounter within last 6 months    Recent Outpatient Visits          1 month ago Cervical radiculopathy   [facility] Montel Culver, MD   3 months ago Segmental and somatic dysfunction of cervical region   [facility] Montel Culver, MD   5 months ago Encounter for annual physical exam   [facility] Montel Culver, MD   6 months ago Primary hypertension   [facility] Montel Culver, MD   1 year ago Viral upper respiratory tract infection   [place], [place], MD      Future Appointments            In 7 months Zigmund Daniel, Earley Abide, MD [facility], [facility]

## 2021-10-18 ENCOUNTER — Other Ambulatory Visit: Payer: Self-pay | Admitting: Family Medicine

## 2021-10-18 DIAGNOSIS — M9901 Segmental and somatic dysfunction of cervical region: Secondary | ICD-10-CM

## 2021-10-18 DIAGNOSIS — M5412 Radiculopathy, cervical region: Secondary | ICD-10-CM

## 2021-10-18 DIAGNOSIS — M7542 Impingement syndrome of left shoulder: Secondary | ICD-10-CM

## 2021-10-18 NOTE — Telephone Encounter (Signed)
Requested Prescriptions  Pending Prescriptions Disp Refills   DULoxetine (CYMBALTA) 60 MG capsule [Pharmacy Med Name: DULoxetine HCl 60 MG Oral Capsule Delayed Release Particles] 30 capsule 0    Sig: Take 1 capsule by mouth once daily     Psychiatry: Antidepressants - SNRI - duloxetine Failed - 10/18/2021  5:30 AM      Failed - Last BP in normal range    BP Readings from Last 1 Encounters:  08/23/21 (!) 118/92         Passed - Cr in normal range and within 360 days    Creat  Date Value Ref Range Status  05/22/2019 0.78 0.50 - 1.10 mg/dL Final   Creatinine, Ser  Date Value Ref Range Status  06/17/2021 0.80 0.57 - 1.00 mg/dL Final         Passed - eGFR is 30 or above and within 360 days    GFR, Est African American  Date Value Ref Range Status  05/22/2019 116 > OR = 60 mL/min/1.37m2 Final   GFR, Est Non African American  Date Value Ref Range Status  05/22/2019 100 > OR = 60 mL/min/1.21m2 Final   GFR, Estimated  Date Value Ref Range Status  09/17/2020 >60 >60 mL/min Final    Comment:    (NOTE) Calculated using the CKD-EPI Creatinine Equation (2021)    eGFR  Date Value Ref Range Status  06/17/2021 98 >59 mL/min/1.73 Final         Passed - Completed PHQ-2 or PHQ-9 in the last 360 days      Passed - Valid encounter within last 6 months    Recent Outpatient Visits          1 month ago Cervical radiculopathy   [facility] Montel Culver, MD   3 months ago Segmental and somatic dysfunction of cervical region   [facility] Montel Culver, MD   5 months ago Encounter for annual physical exam   [facility] Montel Culver, MD   6 months ago Primary hypertension   [facility] Montel Culver, MD   1 year ago Viral upper respiratory tract infection   [place], Ephraim, MD      Future Appointments            In 6 months Zigmund Daniel, Earley Abide, MD [facility], [facility]

## 2021-11-05 ENCOUNTER — Encounter: Payer: Self-pay | Admitting: Family Medicine

## 2021-11-07 NOTE — Telephone Encounter (Signed)
Please schedule patient with Dr. Zigmund Daniel for lower back evaluation.

## 2021-11-07 NOTE — Telephone Encounter (Signed)
For your information  

## 2021-11-07 NOTE — Telephone Encounter (Signed)
MRI was denied by patient's insurance.  Someone from pain management has tried to reach patient to schedule and she has not called back.

## 2021-11-08 ENCOUNTER — Ambulatory Visit: Payer: Managed Care, Other (non HMO) | Admitting: Podiatry

## 2021-11-08 ENCOUNTER — Encounter: Payer: Self-pay | Admitting: Podiatry

## 2021-11-08 ENCOUNTER — Other Ambulatory Visit: Payer: Self-pay

## 2021-11-08 DIAGNOSIS — B070 Plantar wart: Secondary | ICD-10-CM

## 2021-11-08 NOTE — Progress Notes (Signed)
° °  Subjective: 36 y.o. female presenting today for follow-up evaluation regarding symptomatic plantar wart to the medial aspect of the right heel.  Patient states that the other warts have resolved completely however this 1 is residual.  She presents to have it evaluated and for further treatment and evaluation   Past Medical History:  Diagnosis Date   Anxiety    Bipolar 1 disorder ([place]) 2016   Depression    Hypertension    Past Surgical History:  Procedure Laterality Date   DILATATION & CURRETTAGE/HYSTEROSCOPY WITH RESECTOCOPE  2011   No Known Allergies  Objective: Physical Exam General: The patient is alert and oriented x3 in no acute distress.   Dermatology: Hyperkeratotic skin lesion(s) noted to the plantar aspect of the right foot approximately 1 cm in diameter. Pinpoint bleeding noted upon debridement. Skin is warm, dry and supple bilateral lower extremities. Negative for open lesions or macerations.   Vascular: Palpable pedal pulses bilaterally. No edema or erythema noted. Capillary refill within normal limits.   Neurological: Epicritic and protective threshold grossly intact bilaterally.    Musculoskeletal Exam: Pain on palpation to the noted skin lesion(s).  Range of motion within normal limits to all pedal and ankle joints bilateral. Muscle strength 5/5 in all groups bilateral.    Assessment: #1 plantar wart right foot #2 pain in right foot     Plan of Care:  #1 Patient was evaluated. #2 Excisional debridement of the plantar wart lesion(s) was performed using a chisel blade.  Salicylic acid was applied and the lesion(s) was dressed with a dry sterile dressing. #3  Continue OTC salicylic acid that the patient has at home as needed #4 patient is to return to clinic as needed     Edrick Kins, DPM [facility]  Dr. Edrick Kins, [address]                                        [place], [place] [address]                Office [phone]  Fax [phone]

## 2021-11-28 ENCOUNTER — Encounter: Payer: Self-pay | Admitting: Family Medicine

## 2021-12-05 ENCOUNTER — Ambulatory Visit
Payer: Managed Care, Other (non HMO) | Attending: Student in an Organized Health Care Education/Training Program | Admitting: Student in an Organized Health Care Education/Training Program

## 2021-12-05 ENCOUNTER — Encounter: Payer: Self-pay | Admitting: Student in an Organized Health Care Education/Training Program

## 2021-12-05 ENCOUNTER — Other Ambulatory Visit: Payer: Self-pay

## 2021-12-05 VITALS — BP 138/81 | HR 72 | Temp 99.0°F | Resp 16 | Ht 62.0 in | Wt 270.0 lb

## 2021-12-05 DIAGNOSIS — M5442 Lumbago with sciatica, left side: Secondary | ICD-10-CM | POA: Insufficient documentation

## 2021-12-05 DIAGNOSIS — M9901 Segmental and somatic dysfunction of cervical region: Secondary | ICD-10-CM | POA: Diagnosis not present

## 2021-12-05 DIAGNOSIS — M5441 Lumbago with sciatica, right side: Secondary | ICD-10-CM | POA: Insufficient documentation

## 2021-12-05 DIAGNOSIS — M5412 Radiculopathy, cervical region: Secondary | ICD-10-CM | POA: Diagnosis not present

## 2021-12-05 DIAGNOSIS — G8929 Other chronic pain: Secondary | ICD-10-CM | POA: Diagnosis present

## 2021-12-05 DIAGNOSIS — M7542 Impingement syndrome of left shoulder: Secondary | ICD-10-CM | POA: Diagnosis present

## 2021-12-05 DIAGNOSIS — M5416 Radiculopathy, lumbar region: Secondary | ICD-10-CM | POA: Insufficient documentation

## 2021-12-05 DIAGNOSIS — M542 Cervicalgia: Secondary | ICD-10-CM

## 2021-12-05 MED ORDER — DULOXETINE HCL 60 MG PO CPEP: 60.0000 mg | ORAL_CAPSULE | Freq: Every day | ORAL | 5 refills | Status: DC

## 2021-12-05 MED ORDER — DICLOFENAC SODIUM 75 MG PO TBEC: 75.0000 mg | DELAYED_RELEASE_TABLET | Freq: Two times a day (BID) | ORAL | 5 refills | Status: DC | PRN

## 2021-12-05 MED ORDER — CYCLOBENZAPRINE HCL 10 MG PO TABS: 10.0000 mg | ORAL_TABLET | Freq: Two times a day (BID) | ORAL | 5 refills | Status: DC | PRN

## 2021-12-05 NOTE — Progress Notes (Signed)
Safety precautions to be maintained throughout the outpatient stay will include: orient to surroundings, keep bed in low position, maintain call bell within reach at all times, provide assistance with transfer out of bed and ambulation.  
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
## 2021-12-05 NOTE — Patient Instructions (Signed)
Refill medications, limit Diclofenac (as this is an NSAID). ?Please follow up after you have completed C-MRI ?

## 2022-01-08 ENCOUNTER — Encounter: Payer: Self-pay | Admitting: Family Medicine

## 2022-01-10 ENCOUNTER — Other Ambulatory Visit: Payer: Self-pay

## 2022-01-13 ENCOUNTER — Encounter: Payer: Self-pay | Admitting: Family Medicine

## 2022-01-27 ENCOUNTER — Ambulatory Visit: Payer: Self-pay | Admitting: Family Medicine

## 2022-02-13 ENCOUNTER — Encounter: Payer: Self-pay | Admitting: Family Medicine

## 2022-02-13 ENCOUNTER — Ambulatory Visit: Payer: Managed Care, Other (non HMO) | Admitting: Family Medicine

## 2022-02-13 VITALS — BP 118/84 | HR 88 | Ht 63.0 in | Wt 274.0 lb

## 2022-02-13 DIAGNOSIS — R040 Epistaxis: Secondary | ICD-10-CM | POA: Diagnosis not present

## 2022-02-13 DIAGNOSIS — M5412 Radiculopathy, cervical region: Secondary | ICD-10-CM | POA: Diagnosis not present

## 2022-02-13 DIAGNOSIS — K2100 Gastro-esophageal reflux disease with esophagitis, without bleeding: Secondary | ICD-10-CM | POA: Diagnosis not present

## 2022-02-13 HISTORY — DX: Epistaxis: R04.0

## 2022-02-13 MED ORDER — CELECOXIB 100 MG PO CAPS: 100.0000 mg | ORAL_CAPSULE | Freq: Two times a day (BID) | ORAL | 1 refills | Status: DC | PRN

## 2022-02-13 MED ORDER — PANTOPRAZOLE SODIUM 40 MG PO TBEC: 40.0000 mg | DELAYED_RELEASE_TABLET | Freq: Every day | ORAL | 1 refills | Status: DC

## 2022-02-13 NOTE — Progress Notes (Signed)
Primary Care / Sports Medicine Office Visit  Patient Information:  Patient ID: Amanda Nash, female DOB: 03-Jun-1986 Age: 36 y.o. MRN: [id number]   Amanda Nash is a pleasant 36 y.o. female presenting with the following:  Chief Complaint  Patient presents with   Epistaxis    Has had since childhood, last from 5-30 mins, bright red blood, nose is dry when it happens but sometimes sneezing cause bleeds as well, air pressure cause bleeds as well, 10 in a month, sometimes 4-5 a week, worse in winter and spring   Gastroesophageal Reflux    Takes otc prilosec does help but is coughing up acid     Vitals:   02/13/22 1520  BP: 118/84  Pulse: 88  SpO2: 98%   Vitals:   02/13/22 1520  Weight: 274 lb (124.3 kg)  Height: '5\' 3"'$  (1.6 m)   Body mass index is 48.54 kg/m.  No results found.   Independent interpretation of notes and tests performed by another provider:   None  Procedures performed:   None  Pertinent History, Exam, Impression, and Recommendations:   Problem List Items Addressed This Visit       Cardiovascular and Mediastinum   Epistaxis, recurrent    Acute on chronic issue ongoing since childhood, duration can be variable up to 30 minutes, occurring at least 10 times a month during certain seasons.  Involves bright red blood, brought on by periods of increased sneezing, allergies, dry air.  Symptoms are worse in the winter and spring.  No further abnormalities in the nasopharynx or oropharynx, as needed supportive care options reviewed such as Afrin, given the chronicity and frequency of symptoms, I have placed a referral to ENT for further evaluation and discussion regarding definitive management options.       Relevant Orders   Ambulatory referral to ENT     Digestive   Gastroesophageal reflux disease with esophagitis without hemorrhage - Primary    Patient with longstanding history of reflux since childhood reported by patient.  Has trialed Prilosec  OTC limited response, involves coughing, gagging of food contents, burning in the esophagus, or symptoms at night.  Lengthy discussion regarding lifestyle modification and correlation with her symptoms reviewed.  Abdominal examination reveals soft, nontender, nondistended abdomen, hypoactive bowel sounds noted.  Plan for transition to pantoprazole 30 minutes prior to dinner x2 weeks then as needed, referral to gastroenterology, and patient oriented information on lifestyle modifications provided.  She is also transition from diclofenac to Celebrex to use sparingly for comorbid cervical radiculopathy symptoms.       Relevant Medications   pantoprazole (PROTONIX) 40 MG tablet   Other Relevant Orders   Ambulatory referral to Gastroenterology     Nervous and Auditory   Cervical radiculopathy    Chronic issue with ongoing symptomatology, awaiting MRI, was denied due to requiring proceeding with physical therapy.  Patient did complete physical therapy times several weeks, we will await their records.  Over the interim I advised transition from diclofenac to Celebrex in effort to minimize GI disturbance.       Relevant Medications   celecoxib (CELEBREX) 100 MG capsule     Orders & Medications Meds ordered this encounter  Medications   pantoprazole (PROTONIX) 40 MG tablet    Sig: Take 1 tablet (40 mg total) by mouth daily.    Dispense:  30 tablet    Refill:  1   celecoxib (CELEBREX) 100 MG capsule    Sig: Take  1 capsule (100 mg total) by mouth 2 (two) times daily as needed.    Dispense:  60 capsule    Refill:  1   Orders Placed This Encounter  Procedures   Ambulatory referral to Gastroenterology   Ambulatory referral to ENT     No follow-ups on file.     Montel Culver, MD   Primary Care Sports Medicine [place]

## 2022-02-13 NOTE — Assessment & Plan Note (Signed)
Patient with longstanding history of reflux since childhood reported by patient.  Has trialed Prilosec OTC limited response, involves coughing, gagging of food contents, burning in the esophagus, or symptoms at night.  Lengthy discussion regarding lifestyle modification and correlation with her symptoms reviewed.  Abdominal examination reveals soft, nontender, nondistended abdomen, hypoactive bowel sounds noted.  Plan for transition to pantoprazole 30 minutes prior to dinner x2 weeks then as needed, referral to gastroenterology, and patient oriented information on lifestyle modifications provided.  She is also transition from diclofenac to Celebrex to use sparingly for comorbid cervical radiculopathy symptoms.

## 2022-02-13 NOTE — Assessment & Plan Note (Signed)
Acute on chronic issue ongoing since childhood, duration can be variable up to 30 minutes, occurring at least 10 times a month during certain seasons.  Involves bright red blood, brought on by periods of increased sneezing, allergies, dry air.  Symptoms are worse in the winter and spring.  No further abnormalities in the nasopharynx or oropharynx, as needed supportive care options reviewed such as Afrin, given the chronicity and frequency of symptoms, I have placed a referral to ENT for further evaluation and discussion regarding definitive management options.

## 2022-02-13 NOTE — Assessment & Plan Note (Signed)
Chronic issue with ongoing symptomatology, awaiting MRI, was denied due to requiring proceeding with physical therapy.  Patient did complete physical therapy times several weeks, we will await their records.  Over the interim I advised transition from diclofenac to Celebrex in effort to minimize GI disturbance.

## 2022-02-13 NOTE — Patient Instructions (Addendum)
-   Stop diclofenac - Start Celebrex, use sparingly and take with meal to minimize stomach side effects - Stop Prilosec - Start pantoprazole every evening 30 minutes before dinner x2 weeks then as needed - Can use Afrin, 2 sprays in affected nostril up to twice daily for maximum of 2-3 days for nosebleeds - Referral coordinator will contact you in regards to scheduling follow-ups with ENT and gastroenterology - Please have physical therapy records faxed to our office - Return as scheduled for annual physical

## 2022-02-15 ENCOUNTER — Telehealth: Payer: Self-pay

## 2022-02-15 NOTE — Telephone Encounter (Signed)
Pt informed and verbalized understanding.  KP 

## 2022-02-15 NOTE — Telephone Encounter (Signed)
Start Date:01/16/2022;Coverage End Date:02/15/2023  KP

## 2022-02-15 NOTE — Telephone Encounter (Signed)
PA completed waiting for insurance approval.  Key: [id number]  KP

## 2022-04-28 ENCOUNTER — Other Ambulatory Visit: Payer: Self-pay | Admitting: Family Medicine

## 2022-04-28 DIAGNOSIS — K2100 Gastro-esophageal reflux disease with esophagitis, without bleeding: Secondary | ICD-10-CM

## 2022-04-28 NOTE — Telephone Encounter (Signed)
Requested Prescriptions  Pending Prescriptions Disp Refills  . pantoprazole (PROTONIX) 40 MG tablet [Pharmacy Med Name: Pantoprazole Sodium 40 MG Oral Tablet Delayed Release] 30 tablet 0    Sig: Take 1 tablet by mouth once daily     Gastroenterology: Proton Pump Inhibitors Passed - 04/28/2022  9:33 AM      Passed - Valid encounter within last 12 months    Recent Outpatient Visits          2 months ago Gastroesophageal reflux disease with esophagitis without hemorrhage   [place] [facility] [place], Earley Abide, MD   8 months ago Cervical radiculopathy   [place] [facility] [place], Earley Abide, MD   10 months ago Segmental and somatic dysfunction of cervical region   [facility] Medicine at [facility], Earley Abide, MD   12 months ago Encounter for annual physical exam   [facility] Care and [facility] Medicine at [facility], Earley Abide, MD   1 year ago Primary hypertension   [place] [facility] at [facility], Earley Abide, MD      Future Appointments            In 1 week Zigmund Daniel, Earley Abide, MD [place] [facility] at [facility]

## 2022-05-05 ENCOUNTER — Telehealth: Payer: Self-pay

## 2022-05-05 ENCOUNTER — Encounter: Payer: Self-pay | Admitting: Family Medicine

## 2022-05-05 ENCOUNTER — Ambulatory Visit: Payer: Managed Care, Other (non HMO) | Admitting: Family Medicine

## 2022-05-05 VITALS — BP 136/86 | HR 88 | Ht 63.0 in | Wt 278.2 lb

## 2022-05-05 DIAGNOSIS — Z124 Encounter for screening for malignant neoplasm of cervix: Secondary | ICD-10-CM | POA: Diagnosis not present

## 2022-05-05 DIAGNOSIS — M25562 Pain in left knee: Secondary | ICD-10-CM

## 2022-05-05 DIAGNOSIS — Z0000 Encounter for general adult medical examination without abnormal findings: Secondary | ICD-10-CM | POA: Insufficient documentation

## 2022-05-05 MED ORDER — DICLOFENAC SODIUM 3 % EX GEL: 1.0000 | Freq: Two times a day (BID) | CUTANEOUS | 0 refills | Status: DC | PRN

## 2022-05-05 NOTE — Assessment & Plan Note (Signed)
Avoid nasal sprays, earlier this [facility]

## 2022-05-05 NOTE — Telephone Encounter (Signed)
PA completed waiting for insurance approval.   Key: [id number]  KP

## 2022-05-05 NOTE — Patient Instructions (Addendum)
-   Use topical diclofenac 3% twice daily as needed - If unable to pick up Rx, use diclofenac 1% (Voltaren gel) over-the-counter 2-4 times daily as needed for knee pain - Start home exercise with information provided - Return for annual physical, if knee still symptomatic leading up to that visit, obtain x-rays prior to visit for review

## 2022-05-05 NOTE — Progress Notes (Signed)
     Primary Care / Sports Medicine Office Visit  Patient Information:  Patient ID: Amanda Nash, female DOB: Dec 09, 1985 Age: 36 y.o. MRN: [id number]   CORIANNE BUCCELLATO is a pleasant 36 y.o. female presenting with the following:  Chief Complaint  Patient presents with   Annual Exam    Vitals:   05/05/22 0823  BP: 136/86  Pulse: 88  SpO2: 98%   Vitals:   05/05/22 0823  Weight: 278 lb 3.2 oz (126.2 kg)  Height: '5\' 3"'$  (1.6 m)   Body mass index is 49.28 kg/m.  No results found.   Independent interpretation of notes and tests performed by another provider:   None  Procedures performed:   None  Pertinent History, Exam, Impression, and Recommendations:   Problem List Items Addressed This Visit       Other   Patellofemoral arthralgia of left knee - Primary    Patient with acute on chronic left anterior knee pain, most recent episode roughly 1 week prior after traveling to the beach.  Examination shows focality to the patellofemoral articulation primarily noted with single-leg squat, where there is pain and increased valgus, provocative testing otherwise negative.  Given comorbid GI concerns GERD, hold from oral NSAID, will trial diclofenac 3% Rx, otherwise OTC Voltaren as an adjunct to home-based rehab.  X-ray was ordered which she can proceed with if still symptomatic leading up to her return visit.      Relevant Medications   Diclofenac Sodium 3 % GEL   Other Relevant Orders   DG Knee Complete 4 Views Right   Other Visit Diagnoses     Cervical cancer screening            Orders & Medications Meds ordered this encounter  Medications   Diclofenac Sodium 3 % GEL    Sig: Apply 1 Application topically 2 (two) times daily as needed.    Dispense:  100 g    Refill:  0   Orders Placed This Encounter  Procedures   DG Knee Complete 4 Views Right     Return in about 3 months (around 08/05/2022) for HTN follow-up.     Montel Culver, MD   Primary Care  Sports Medicine [place]

## 2022-05-05 NOTE — Assessment & Plan Note (Signed)
Patient with acute on chronic left anterior knee pain, most recent episode roughly 1 week prior after traveling to the beach.  Examination shows focality to the patellofemoral articulation primarily noted with single-leg squat, where there is pain and increased valgus, provocative testing otherwise negative.  Given comorbid GI concerns GERD, hold from oral NSAID, will trial diclofenac 3% Rx, otherwise OTC Voltaren as an adjunct to home-based rehab.  X-ray was ordered which she can proceed with if still symptomatic leading up to her return visit.

## 2022-05-08 NOTE — Telephone Encounter (Signed)
Denied  KP 

## 2022-05-14 ENCOUNTER — Other Ambulatory Visit: Payer: Self-pay | Admitting: Family Medicine

## 2022-05-14 DIAGNOSIS — M5412 Radiculopathy, cervical region: Secondary | ICD-10-CM

## 2022-05-16 NOTE — Telephone Encounter (Signed)
Requested Prescriptions  Pending Prescriptions Disp Refills  . celecoxib (CELEBREX) 100 MG capsule [Pharmacy Med Name: Celecoxib 100 MG Oral Capsule] 60 capsule 0    Sig: TAKE 1 CAPSULE BY MOUTH TWICE DAILY AS NEEDED     Analgesics:  COX2 Inhibitors Failed - 05/14/2022 12:57 PM      Failed - Manual Review: Labs are only required if the patient has taken medication for more than 8 weeks.      Passed - HGB in normal range and within 360 days    Hemoglobin  Date Value Ref Range Status  06/17/2021 13.4 11.1 - 15.9 g/dL Final         Passed - Cr in normal range and within 360 days    Creat  Date Value Ref Range Status  05/22/2019 0.78 0.50 - 1.10 mg/dL Final   Creatinine, Ser  Date Value Ref Range Status  06/17/2021 0.80 0.57 - 1.00 mg/dL Final         Passed - HCT in normal range and within 360 days    Hematocrit  Date Value Ref Range Status  06/17/2021 40.5 34.0 - 46.6 % Final         Passed - AST in normal range and within 360 days    AST  Date Value Ref Range Status  06/17/2021 14 0 - 40 IU/L Final         Passed - ALT in normal range and within 360 days    ALT  Date Value Ref Range Status  06/17/2021 26 0 - 32 IU/L Final         Passed - eGFR is 30 or above and within 360 days    GFR, Est African American  Date Value Ref Range Status  05/22/2019 116 > OR = 60 mL/min/1.92m Final   GFR, Est Non African American  Date Value Ref Range Status  05/22/2019 100 > OR = 60 mL/min/1.749mFinal   GFR, Estimated  Date Value Ref Range Status  09/17/2020 >60 >60 mL/min Final    Comment:    (NOTE) Calculated using the CKD-EPI Creatinine Equation (2021)    eGFR  Date Value Ref Range Status  06/17/2021 98 >59 mL/min/1.73 Final         Passed - Patient is not pregnant      Passed - Valid encounter within last 12 months    Recent Outpatient Visits          1 week ago Patellofemoral arthralgia of left knee   [place] [facility]   3 months ago Gastroesophageal reflux disease with esophagitis without hemorrhage   [place] [facility]   8 months ago Cervical radiculopathy    [facility]   10 months ago Segmental and somatic dysfunction of cervical region   [facility]   1 year ago [facility]counter for annual physical exam   [facility]      Future Appointments            In 2 weeks [facility]

## 2022-05-22 ENCOUNTER — Encounter: Payer: Self-pay | Admitting: Podiatry

## 2022-05-26 ENCOUNTER — Encounter: Payer: Managed Care, Other (non HMO) | Admitting: Family Medicine

## 2022-05-30 ENCOUNTER — Encounter: Payer: Self-pay | Admitting: Family Medicine

## 2022-05-30 ENCOUNTER — Encounter: Payer: Managed Care, Other (non HMO) | Admitting: Family Medicine

## 2022-06-09 ENCOUNTER — Ambulatory Visit: Payer: Managed Care, Other (non HMO) | Admitting: Podiatry

## 2022-06-26 ENCOUNTER — Ambulatory Visit: Payer: Managed Care, Other (non HMO) | Admitting: Gastroenterology

## 2022-09-14 ENCOUNTER — Ambulatory Visit: Payer: Self-pay | Admitting: *Deleted

## 2022-09-14 ENCOUNTER — Emergency Department: Payer: Managed Care, Other (non HMO)

## 2022-09-14 ENCOUNTER — Other Ambulatory Visit: Payer: Self-pay

## 2022-09-14 ENCOUNTER — Emergency Department
Admission: EM | Admit: 2022-09-14 | Discharge: 2022-09-14 | Disposition: A | Discharge status: Home / Self Care | Payer: Managed Care, Other (non HMO) | Attending: Emergency Medicine | Admitting: Emergency Medicine

## 2022-09-14 DIAGNOSIS — R0602 Shortness of breath: Secondary | ICD-10-CM | POA: Diagnosis present

## 2022-09-14 DIAGNOSIS — R079 Chest pain, unspecified: Secondary | ICD-10-CM

## 2022-09-14 DIAGNOSIS — I10 Essential (primary) hypertension: Secondary | ICD-10-CM | POA: Insufficient documentation

## 2022-09-14 LAB — TROPONIN I (HIGH SENSITIVITY): Troponin I (High Sensitivity): 2 ng/L (ref <18)

## 2022-09-14 LAB — BASIC METABOLIC PANEL
Anion gap: 7 (ref 5–15)
BUN: 14 mg/dL (ref 6–20)
CO2: 24 mmol/L (ref 22–32)
Calcium: 8.7 mg/dL — ABNORMAL LOW (ref 8.9–10.3)
Chloride: 104 mmol/L (ref 98–111)
Creatinine, Ser: 0.71 mg/dL (ref 0.44–1.00)
GFR, Estimated: >60 mL/min (ref >60)
Glucose, Bld: 115 mg/dL — ABNORMAL HIGH (ref 70–99)
Potassium: 4 mmol/L (ref 3.5–5.1)
Sodium: 135 mmol/L (ref 135–145)

## 2022-09-14 LAB — CBC
HCT: 41.4 % (ref 36.0–46.0)
Hemoglobin: 13.1 g/dL (ref 12.0–15.0)
MCH: 25.4 pg — ABNORMAL LOW (ref 26.0–34.0)
MCHC: 31.6 g/dL (ref 30.0–36.0)
MCV: 80.4 fL (ref 80.0–100.0)
Platelets: 366 10*3/uL (ref 150–400)
RBC: 5.15 MIL/uL — ABNORMAL HIGH (ref 3.87–5.11)
RDW: 13.9 % (ref 11.5–15.5)
WBC: 6.9 10*3/uL (ref 4.0–10.5)
nRBC: 0 % (ref 0.0–0.2)

## 2022-09-14 IMAGING — CR DG SHOULDER 2+V*L*
3 series · 3 of 3 positions shown · non-contrast
Comparison: None.

CLINICAL DATA: Left shoulder pain.

EXAM:
LEFT SHOULDER - 2+ VIEW

[shoulder grashey]
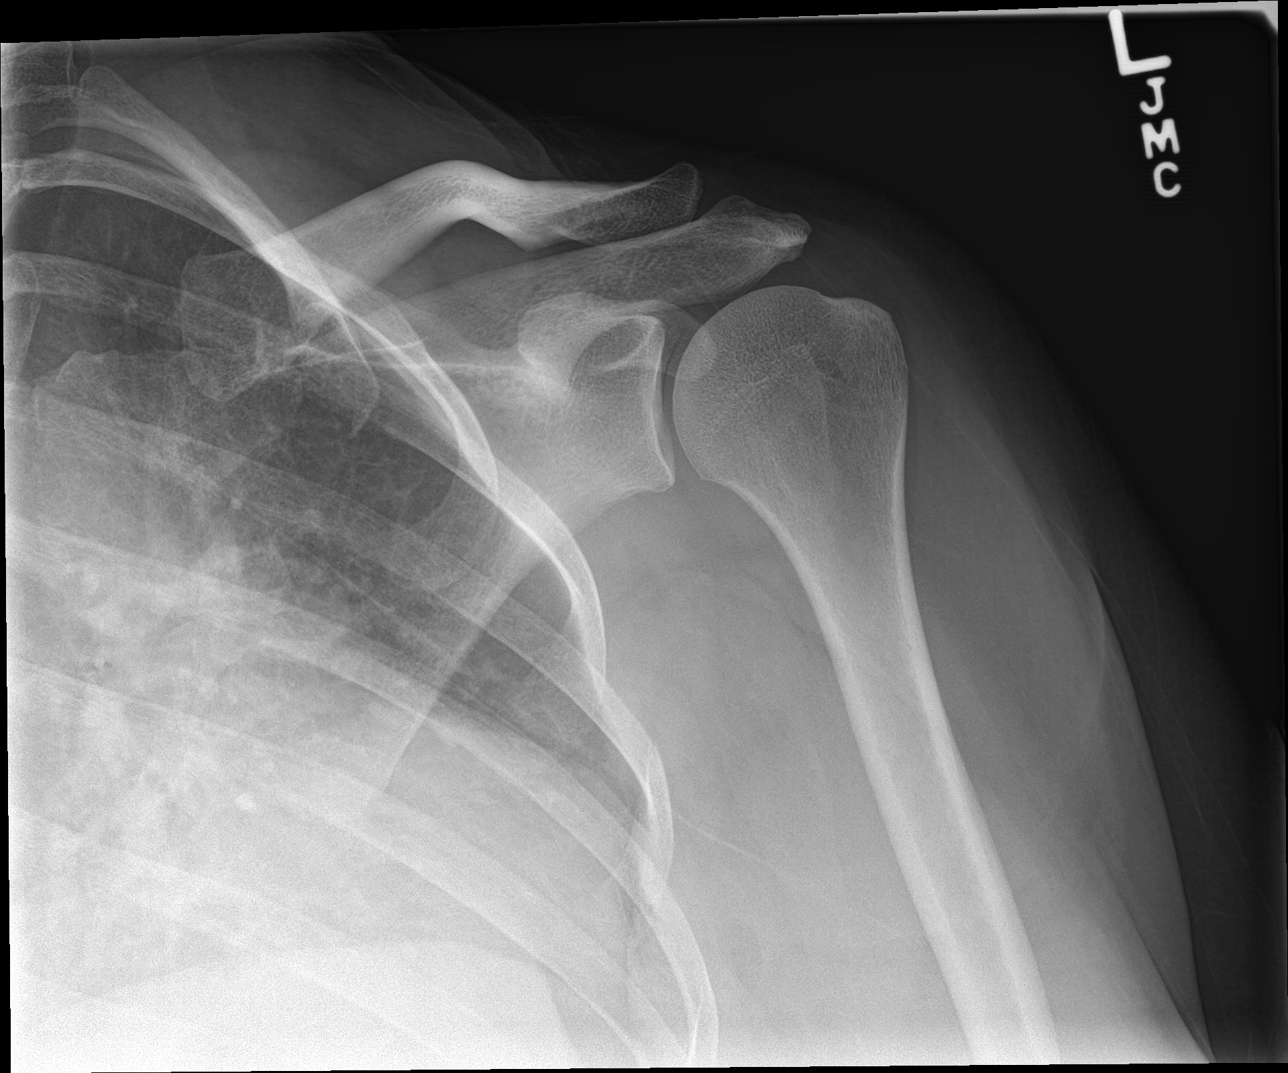

[shoulder y view]
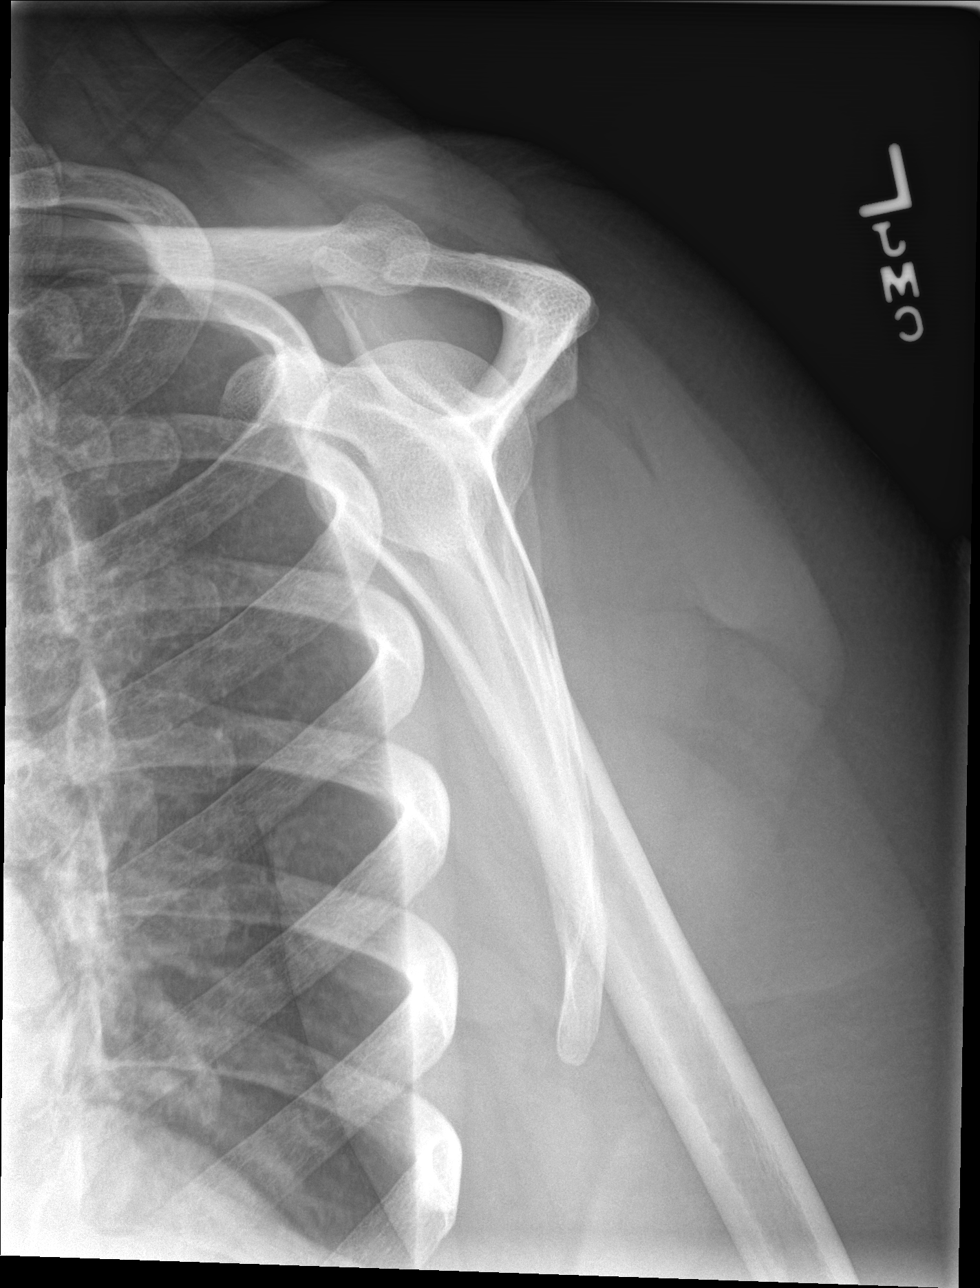

[shoulder axial]
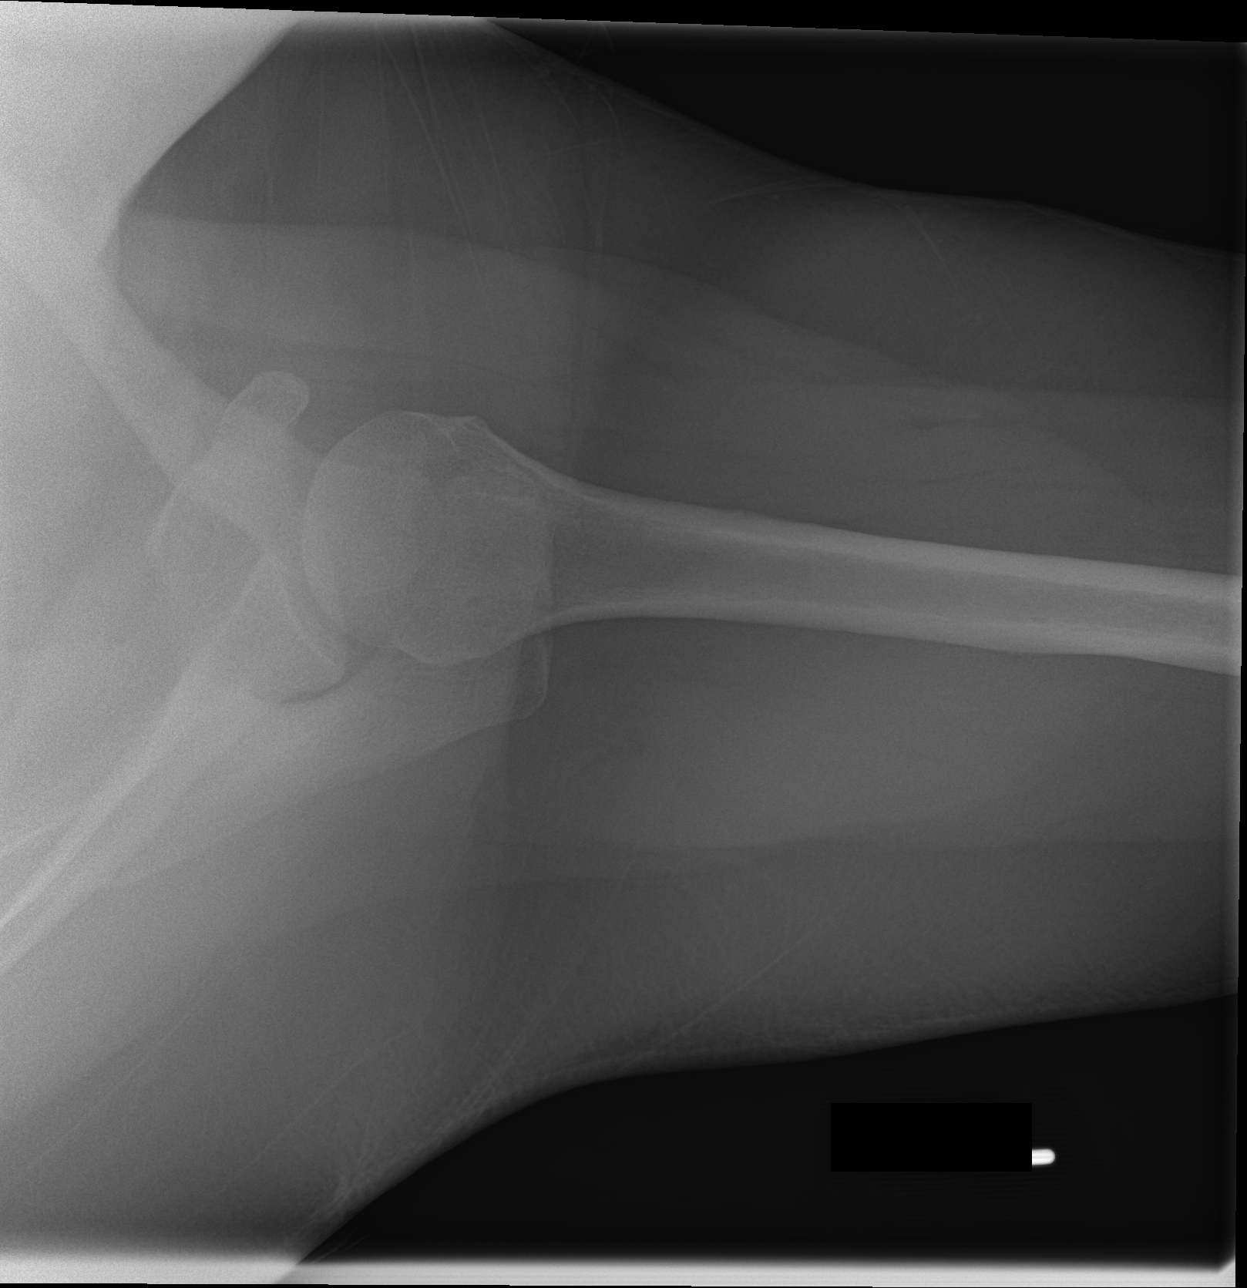

[3 of 3 positions shown; findings below may reference images not displayed]

FINDINGS: There is no evidence of fracture or dislocation. There is no
evidence of arthropathy or other focal bone abnormality. Soft
tissues are unremarkable.
IMPRESSION: Negative.

## 2022-09-14 IMAGING — CR DG SHOULDER 2+V*R*
3 series · 3 of 3 positions shown · non-contrast
Comparison: None.

CLINICAL DATA: Bilateral shoulder pain, right greater than left.
History of arthritis. No known injury.

EXAM:
RIGHT SHOULDER - 2+ VIEW

[shoulder grashey]
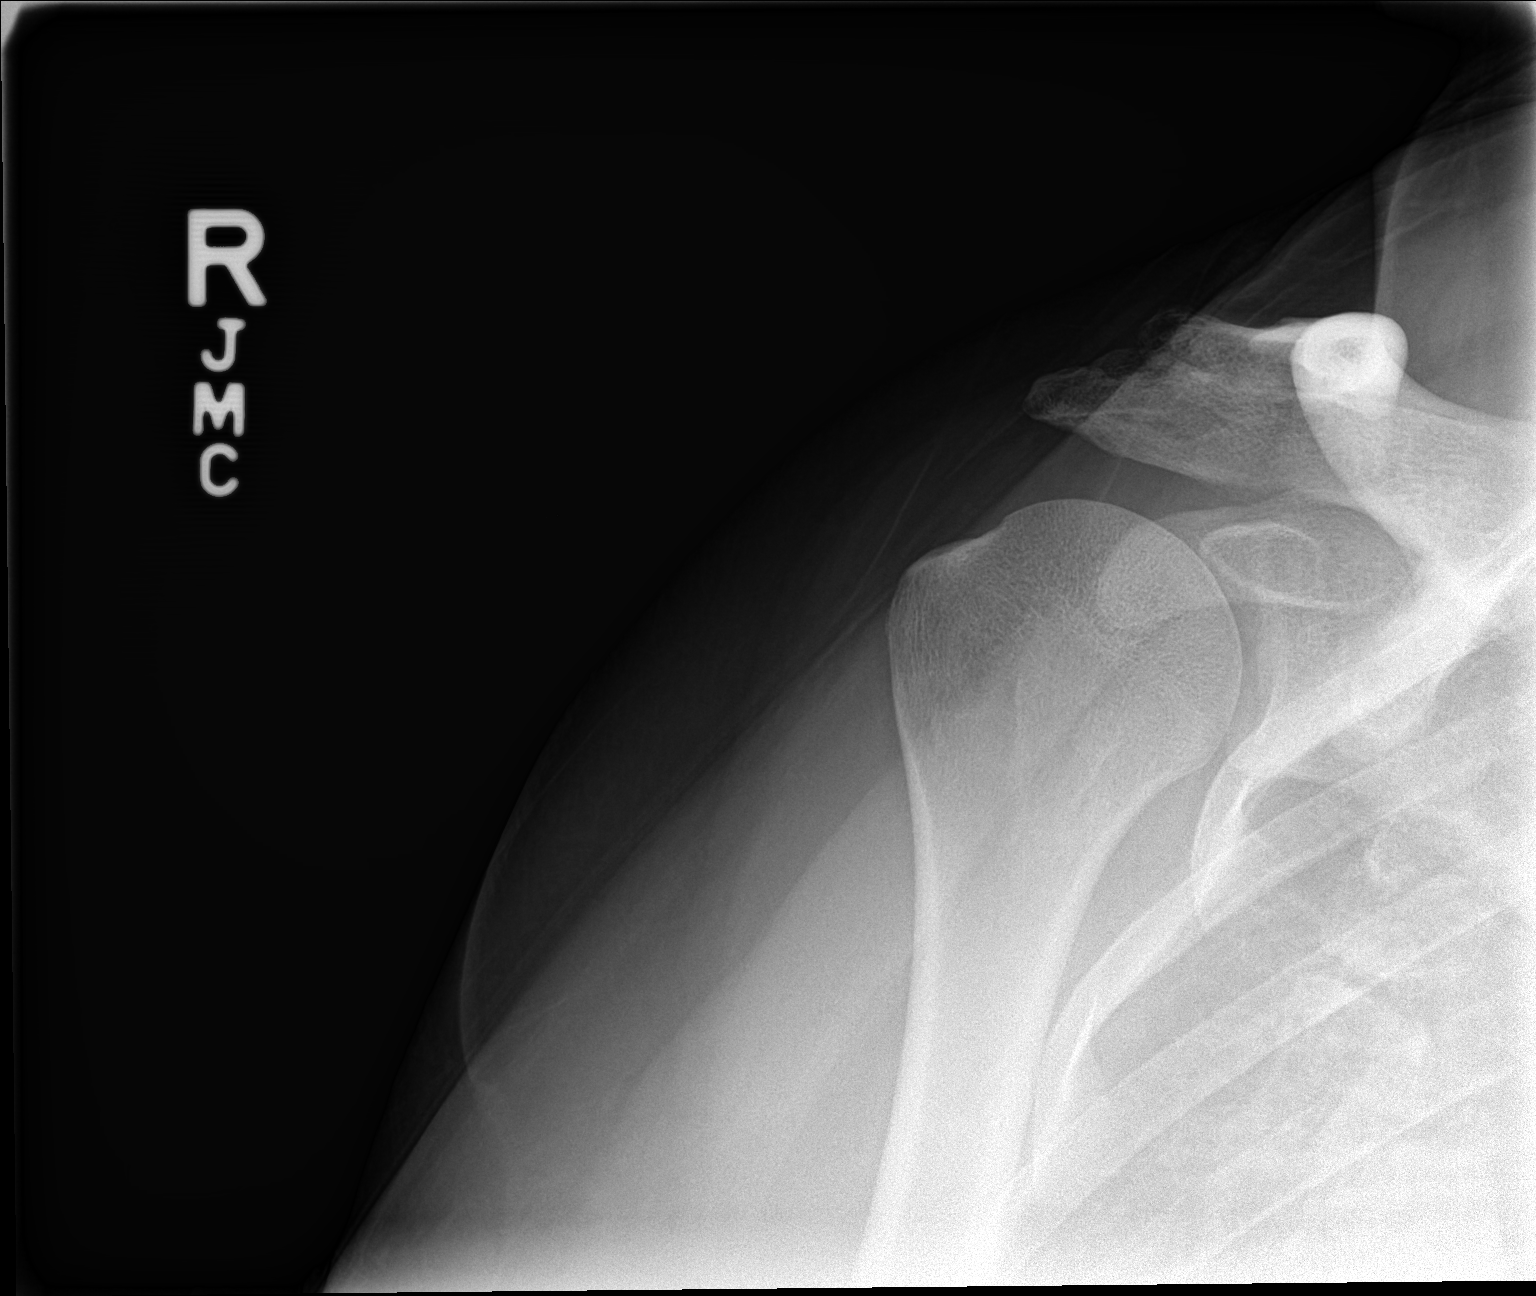

[shoulder y view]
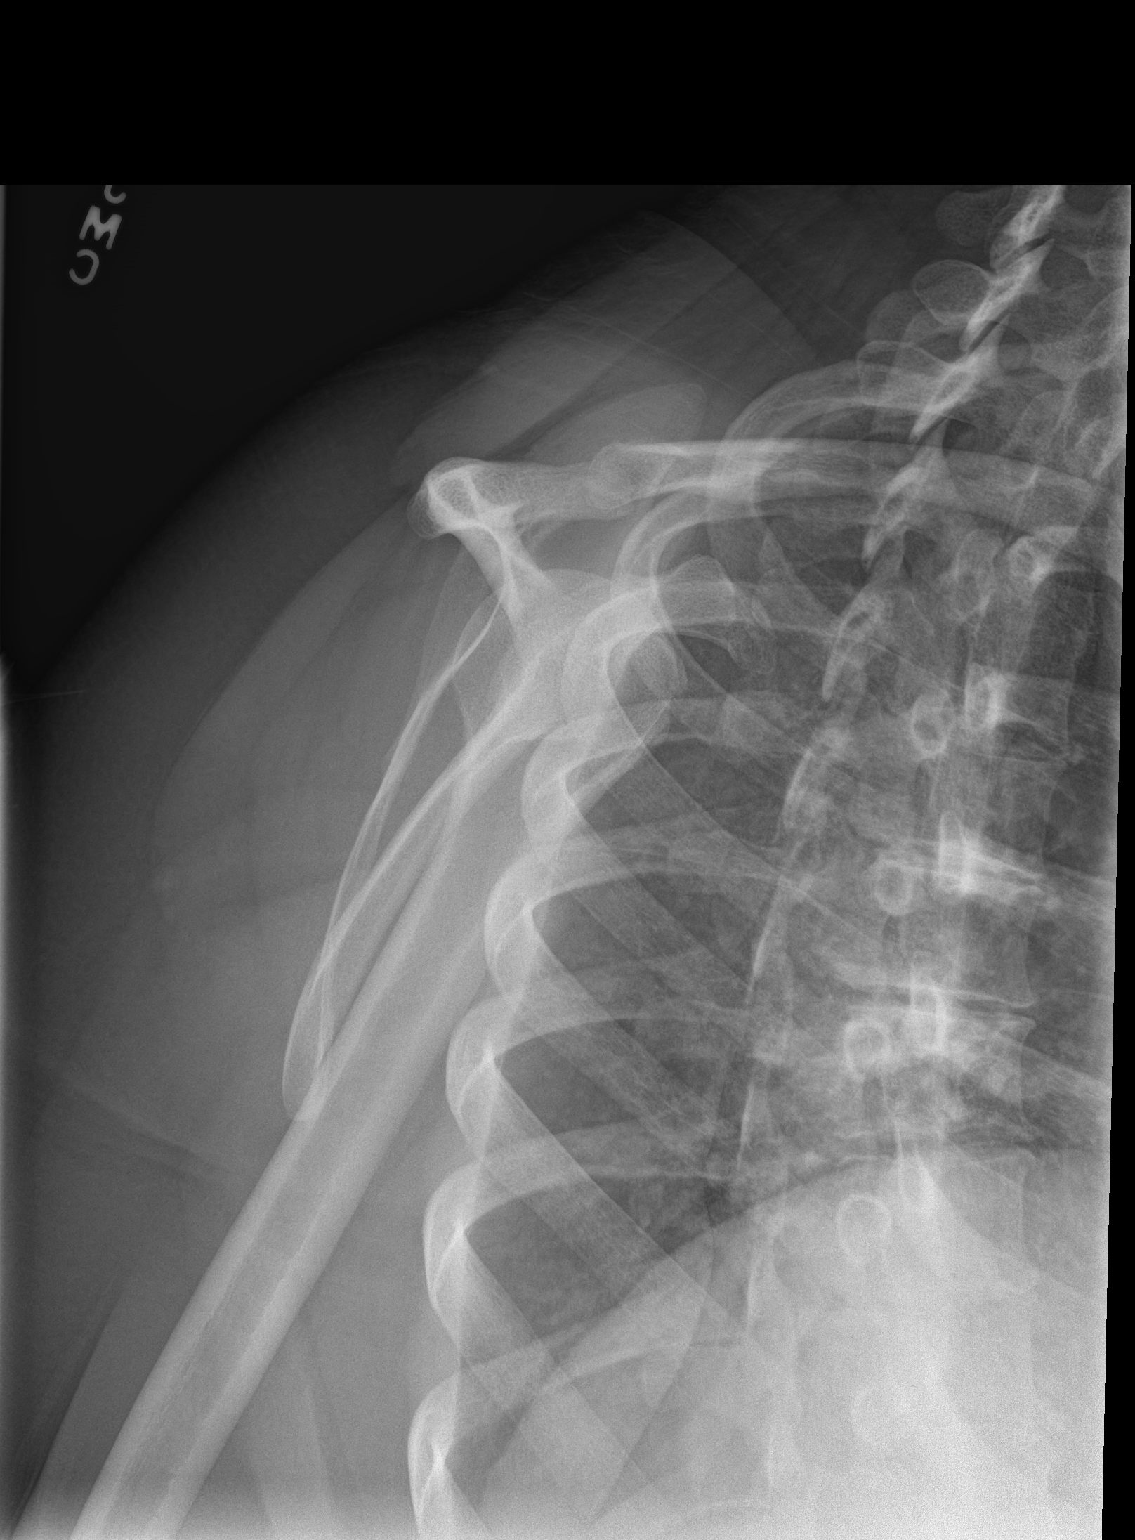

[shoulder axial]
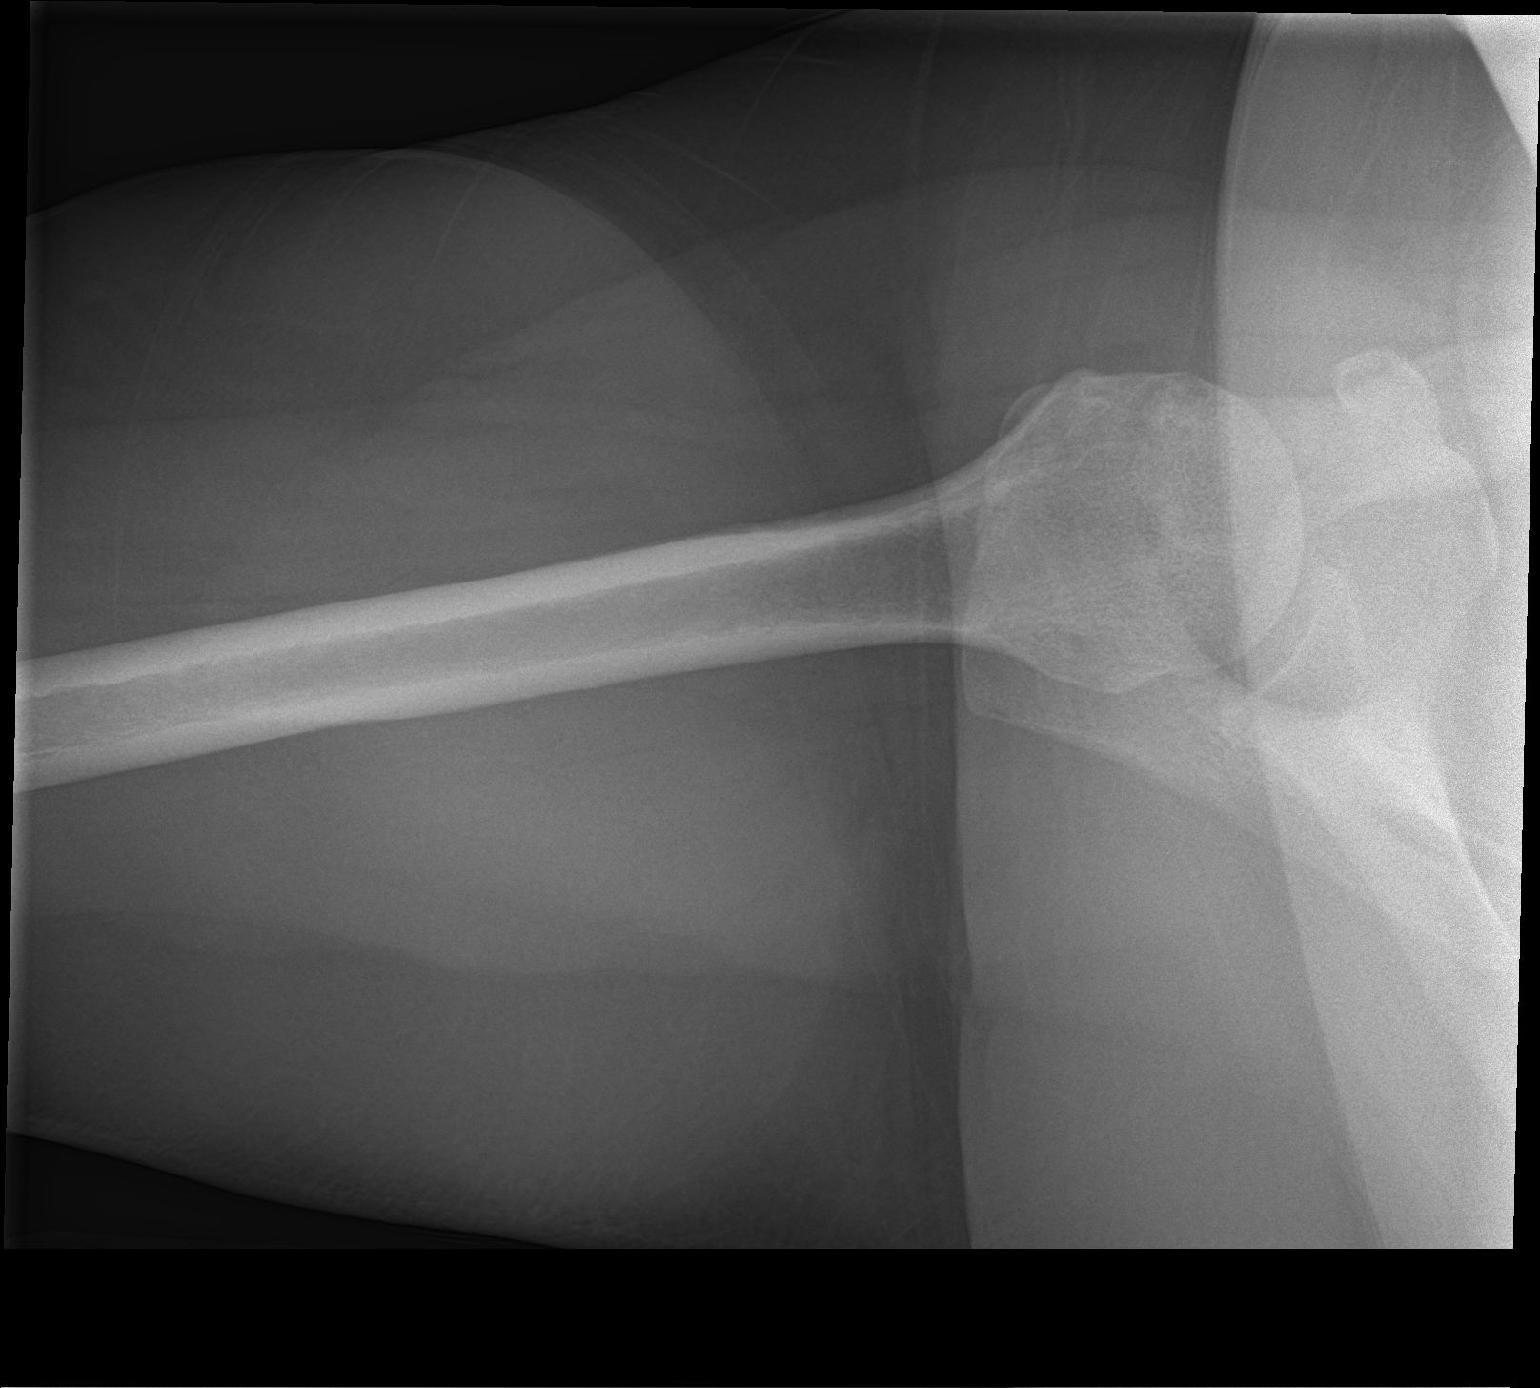

[3 of 3 positions shown; findings below may reference images not displayed]

FINDINGS: There is no evidence of fracture or dislocation. There is no
evidence of arthropathy or other focal bone abnormality. Soft
tissues are unremarkable.
IMPRESSION: Negative.

## 2022-09-14 MED ORDER — AMLODIPINE BESYLATE 5 MG PO TABS: 5.0000 mg | ORAL_TABLET | Freq: Every day | ORAL | 0 refills | Status: DC

## 2022-09-14 NOTE — ED Triage Notes (Signed)
Pt comes with c/o cp and sob for two days. Pt state mid sternal cp. Pt states 5/10 pain. Pt denies any hx of this.  Pt states BP is also elevated but doesn't take any meds.

## 2022-09-14 NOTE — ED Notes (Signed)
Pt called and no answer in waiting room at this time.

## 2022-09-14 NOTE — Telephone Encounter (Signed)
  Chief Complaint: Elevated BP 181/115 this morning Symptoms: Headaches, blurry vision, very tired and feeling "off" Frequency: Elevated BP noticed at eye dr. Hilaria Ota. yesterday Pertinent Negatives: Patient denies passing out Disposition: '[x]'$ ED /'[]'$ Urgent Care (no appt availability in office) / '[]'$ Appointment(In office/virtual)/ '[]'$  [place] [facility]/ '[]'$ Home Care/ '[]'$ Refused Recommended Disposition /'[]'$ [place] [facility]/ '[]'$  Follow-up with PCP Additional Notes: Pt agreeable to going to the ED.   Husband is taking her now.  Information sent to Dr. Rosette Reveal.

## 2022-09-14 NOTE — ED Provider Notes (Signed)
[facility] Provider Note   Event Date/Time   First MD Initiated Contact with Patient 09/14/22 1002     (approximate) History  Chest Pain  HPI Amanda Nash is a 37 y.o. female with a past medical history of obesity who presents for intermittent chest pain and shortness of breath over the last 48 hours.  Patient also endorses headache that is worse in the occipital region but also in frontal that is throbbing and associated with intermittent blurred vision.  Patient states she has never had symptoms similar to this in the past but also has been taking her blood pressure and found it to be elevated with systolics at 010 yesterday.  Patient denies any diagnosis of hypertension or being being given antihypertensive medications in the past.  Patient denies any exacerbating or relieving factors for this pain. ROS: Patient currently denies any vision changes, tinnitus, difficulty speaking, facial droop, sore throat, shortness of breath, abdominal pain, nausea/vomiting/diarrhea, dysuria, or weakness/numbness/paresthesias in any extremity   Physical Exam  Triage Vital Signs: ED Triage Vitals  Enc Vitals Group     BP 09/14/22 0859 (!) 155/106     Pulse Rate 09/14/22 0859 90     Resp 09/14/22 0859 18     Temp 09/14/22 0859 97.6 F (36.4 C)     Temp Source 09/14/22 0859 Oral     SpO2 09/14/22 0859 95 %     Weight --      Height --      Head Circumference --      Peak Flow --      Pain Score 09/14/22 0857 5     Pain Loc --      Pain Edu? --      Excl. in [place]? --    Most recent vital signs: Vitals:   09/14/22 0859  BP: (!) 155/106  Pulse: 90  Resp: 18  Temp: 97.6 F (36.4 C)  SpO2: 95%   General: Awake, oriented x4. CV:  Good peripheral perfusion.  Resp:  Normal effort.  Abd:  No distention.  Other:  Middle-aged obese Caucasian female sitting in recliner in exam room in no acute distress. ED Results / Procedures / Treatments  Labs (all labs ordered are  listed, but only abnormal results are displayed) Labs Reviewed  BASIC METABOLIC PANEL - Abnormal; Notable for the following components:      Result Value   Glucose, Bld 115 (*)    Calcium 8.7 (*)    All other components within normal limits  CBC - Abnormal; Notable for the following components:   RBC 5.15 (*)    MCH 25.4 (*)    All other components within normal limits  RESP PANEL BY RT-PCR (RSV, FLU A&B, COVID)  [id number]  POC URINE PREG, ED  TROPONIN I (HIGH SENSITIVITY)   EKG ED ECG REPORT I, Naaman Plummer, the attending physician, personally viewed and interpreted this ECG. Date: 09/14/2022 EKG Time: 0858 Rate: 64 Rhythm: normal sinus rhythm QRS Axis: normal Intervals: normal ST/T Wave abnormalities: normal Narrative Interpretation: no evidence of acute ischemia RADIOLOGY ED MD interpretation: 2 view chest x-ray interpreted by me shows no evidence of acute abnormalities including no pneumonia, pneumothorax, or widened mediastinum -Agree with radiology assessment Official radiology report(s): DG Chest 2 View  Result Date: 09/14/2022 CLINICAL DATA:  Chest pain shortness of breath. EXAM: CHEST - 2 VIEW COMPARISON:  September 18, 2020 FINDINGS: Cardiomediastinal contours and hilar structures are normal. Lungs are clear.  No sign of pleural effusion or pneumothorax. On limited assessment no acute skeletal process. IMPRESSION: No active cardiopulmonary disease. Electronically Signed   By: Zetta Bills M.D.   On: 09/14/2022 09:24   PROCEDURES: Critical Care performed: No .1-3 Lead EKG Interpretation  Performed by: Naaman Plummer, MD Authorized by: Naaman Plummer, MD     Interpretation: normal     ECG rate:  71   ECG rate assessment: normal     Rhythm: sinus rhythm     Ectopy: none     Conduction: normal    MEDICATIONS ORDERED IN ED: Medications - No data to display IMPRESSION / MDM / [place] / ED COURSE  I reviewed the triage vital signs and the nursing notes.                              The patient is on the cardiac monitor to evaluate for evidence of arrhythmia and/or significant heart rate changes. Patient's presentation is most consistent with acute presentation with potential threat to life or bodily function. Presents to the emergency department complaining of high blood pressure. Patient is otherwise asymptomatic without confusion, chest pain, hematuria, or SOB. Denies antihypertensive regimen DDx: CV, AMI, heart failure, renal infarction or failure or other end organ damage.  Disposition: Discussed with patient their elevated blood pressure and need for close outpatient management of their hypertension. Will provide a prescription for amlodipine '5mg'$  PO daily and arrange for the patient to follow up in a primary care clinic   FINAL CLINICAL IMPRESSION(S) / ED DIAGNOSES   Final diagnoses:  Chest pain, unspecified type  Hypertension, unspecified type   Rx / DC Orders   ED Discharge Orders          Ordered    amLODipine (NORVASC) 5 MG tablet  Daily        09/14/22 1003           Note:  This document was prepared using Dragon voice recognition software and may include unintentional dictation errors.   Naaman Plummer, MD 09/14/22 1020

## 2022-09-14 NOTE — Telephone Encounter (Signed)
noted 

## 2022-09-14 NOTE — Telephone Encounter (Signed)
Reason for Disposition  [2] Systolic BP  >= 956 OR Diastolic >= 213 AND [0] cardiac (e.g., breathing difficulty, chest pain) or neurologic symptoms (e.g., new-onset blurred or double vision, unsteady gait)    Going to [facility]  Answer Assessment - Initial Assessment Questions 1. BLOOD PRESSURE: "What is the blood pressure?" "Did you take at least two measurements 5 minutes apart?"     Saw eye dr. Wilburn Mylar due to blurry vision, headaches.   My left eye has hardening of th veins.   My BP is so high.   170/100 in his office.  Today chest tightness, shortness of breath when I got up this morning, took a while to catch my breath.   I'm breathing harder than normal.   I'm very tired.   181/115 this morning.    With my first Covid vaccine my BP shot up really high.   They put me on amolidipine . but I've stopped it.   It was 110/70 and was staying good.   I'm having bad headaches for the last week or so.    I'm still having blurry vision in both eyes with left being worse.   2. ONSET: "When did you take your blood pressure?"     This morning 181/115 and I'm having a bad headache, feeling off and am very tired. 3. HOW: "How did you take your blood pressure?" (e.g., automatic home BP monitor, visiting nurse)     Home BP kit. 4. HISTORY: "Do you have a history of high blood pressure?"     See above   Was on amlodipine after taking the Covid shot because it caused her BP to shoot up.   No off the amlodipine and has been doing fine until yesterday at the eye dr. Her BP was elevated. 5. MEDICINES: "Are you taking any medicines for blood pressure?" "Have you missed any doses recently?"     No 6. OTHER SYMPTOMS: "Do you have any symptoms?" (e.g., blurred vision, chest pain, difficulty breathing, headache, weakness)     Chest pain, blurred vision, shortness of breath, headaches, and very tired 7. PREGNANCY: "Is there any chance you are pregnant?" "When was your last menstrual period?"     Not asked  Protocols  used: Blood Pressure - High-A-AH

## 2022-09-15 ENCOUNTER — Encounter: Payer: Self-pay | Admitting: Family Medicine

## 2022-09-15 ENCOUNTER — Ambulatory Visit (INDEPENDENT_AMBULATORY_CARE_PROVIDER_SITE_OTHER): Payer: Managed Care, Other (non HMO) | Admitting: Family Medicine

## 2022-09-15 VITALS — BP 142/92 | HR 80 | Ht 63.0 in | Wt 284.0 lb

## 2022-09-15 DIAGNOSIS — R7301 Impaired fasting glucose: Secondary | ICD-10-CM | POA: Diagnosis not present

## 2022-09-15 DIAGNOSIS — I10 Essential (primary) hypertension: Secondary | ICD-10-CM | POA: Diagnosis not present

## 2022-09-15 DIAGNOSIS — E785 Hyperlipidemia, unspecified: Secondary | ICD-10-CM

## 2022-09-15 DIAGNOSIS — R7303 Prediabetes: Secondary | ICD-10-CM

## 2022-09-15 MED ORDER — AMLODIPINE BESYLATE 10 MG PO TABS: 10.0000 mg | ORAL_TABLET | Freq: Every day | ORAL | 11 refills | Status: DC

## 2022-09-15 NOTE — Patient Instructions (Addendum)
-   Increase amlodipine to 10 mg daily - Obtain fasting labs - Review weight loss medications and check with your insurance about coverage - Continue healthy lifestyle changes

## 2022-09-17 NOTE — Progress Notes (Signed)
     Primary Care / Sports Medicine Office Visit  Patient Information:  Patient ID: Amanda Nash, female DOB: 08-22-1986 Age: 37 y.o. MRN: [id number]   Amanda Nash is a pleasant 37 y.o. female presenting with the following:  Chief Complaint  Patient presents with   Hospitalization Follow-up    Had headache for 3 days, went to ER yesterday with High blood pressure, states she was having chest tightness. No chest tightness today.     Vitals:   09/15/22 1427 09/15/22 1432  BP: (!) 140/90 (!) 142/92  Pulse: 80   SpO2: 98%    Vitals:   09/15/22 1427  Weight: 284 lb (128.8 kg)  Height: '5\' 3"'$  (1.6 m)   Body mass index is 50.31 kg/m.     Independent interpretation of notes and tests performed by another provider:   None  Procedures performed:   None  Pertinent History, Exam, Impression, and Recommendations:   Amanda Nash was seen today for hospitalization follow-up.  Primary hypertension Assessment & Plan: Had previously been on 5 mg dose blood pressure without adequate control. Cardiopulmonary exam findings are benign and she is asymptomatic.  Plan for risk stratification labs, titrate amlodipine to 10 mg, and close follow-up.  Orders: -     Comprehensive metabolic panel -     Lipid panel -     TSH -     VITAMIN D 25 Hydroxy (Vit-D Deficiency, Fractures) -     CBC -     Hemoglobin A1c -     amLODIPine Besylate; Take 1 tablet (10 mg total) by mouth daily.  Dispense: 30 tablet; Refill: 11  Elevated fasting glucose -     Comprehensive metabolic panel -     Hemoglobin A1c  Prediabetes Assessment & Plan: Updated labs ordered.  Orders: -     Comprehensive metabolic panel -     Lipid panel -     TSH -     VITAMIN D 25 Hydroxy (Vit-D Deficiency, Fractures) -     CBC -     Hemoglobin A1c  Hyperlipidemia, unspecified hyperlipidemia type -     Comprehensive metabolic panel -     Lipid panel     Orders & Medications Meds ordered this encounter   Medications   amLODipine (NORVASC) 10 MG tablet    Sig: Take 1 tablet (10 mg total) by mouth daily.    Dispense:  30 tablet    Refill:  11   Orders Placed This Encounter  Procedures   Comprehensive metabolic panel   Lipid panel   TSH   VITAMIN D 25 Hydroxy (Vit-D Deficiency, Fractures)   CBC   Hemoglobin A1c     Return in about 6 weeks (around 10/27/2022) for CPE.     Montel Culver, MD, [facility]   Primary Care Sports Medicine Primary Care and [facility] Medicine at [facility]

## 2022-09-17 NOTE — Assessment & Plan Note (Signed)
Had previously been on 5 mg dose blood pressure without adequate control. Cardiopulmonary exam findings are benign and she is asymptomatic.  Plan for risk stratification labs, titrate amlodipine to 10 mg, and close follow-up.

## 2022-09-17 NOTE — Assessment & Plan Note (Signed)
Updated labs ordered.

## 2022-09-19 ENCOUNTER — Encounter: Payer: Self-pay | Admitting: Family Medicine

## 2022-09-19 NOTE — Telephone Encounter (Signed)
Please advise 

## 2022-09-20 ENCOUNTER — Other Ambulatory Visit: Payer: Self-pay

## 2022-09-20 DIAGNOSIS — I10 Essential (primary) hypertension: Secondary | ICD-10-CM

## 2022-09-27 LAB — COMPREHENSIVE METABOLIC PANEL
ALT: 43 IU/L — ABNORMAL HIGH (ref 0–32)
AST: 26 IU/L (ref 0–40)
Albumin/Globulin Ratio: 1.6 (ref 1.2–2.2)
Albumin: 4.4 g/dL (ref 3.9–4.9)
Alkaline Phosphatase: 73 IU/L (ref 44–121)
BUN/Creatinine Ratio: 14 (ref 9–23)
BUN: 11 mg/dL (ref 6–20)
Bilirubin Total: 0.3 mg/dL (ref 0.0–1.2)
CO2: 22 mmol/L (ref 20–29)
Calcium: 9.5 mg/dL (ref 8.7–10.2)
Chloride: 99 mmol/L (ref 96–106)
Creatinine, Ser: 0.76 mg/dL (ref 0.57–1.00)
Globulin, Total: 2.8 g/dL (ref 1.5–4.5)
Glucose: 132 mg/dL — ABNORMAL HIGH (ref 70–99)
Potassium: 4.2 mmol/L (ref 3.5–5.2)
Sodium: 138 mmol/L (ref 134–144)
Total Protein: 7.2 g/dL (ref 6.0–8.5)
eGFR: 103 mL/min/{1.73_m2} (ref >59)

## 2022-09-27 LAB — CBC
Hematocrit: 42.4 % (ref 34.0–46.6)
Hemoglobin: 13.9 g/dL (ref 11.1–15.9)
MCH: 25.9 pg — ABNORMAL LOW (ref 26.6–33.0)
MCHC: 32.8 g/dL (ref 31.5–35.7)
MCV: 79 fL (ref 79–97)
Platelets: 331 10*3/uL (ref 150–450)
RBC: 5.36 x10E6/uL — ABNORMAL HIGH (ref 3.77–5.28)
RDW: 14.6 % (ref 11.7–15.4)
WBC: 6.5 10*3/uL (ref 3.4–10.8)

## 2022-09-27 LAB — LIPID PANEL
Chol/HDL Ratio: 2.9 ratio (ref 0.0–4.4)
Cholesterol, Total: 174 mg/dL (ref 100–199)
HDL: 61 mg/dL (ref >39)
LDL Chol Calc (NIH): 76 mg/dL (ref 0–99)
Triglycerides: 227 mg/dL — ABNORMAL HIGH (ref 0–149)
VLDL Cholesterol Cal: 37 mg/dL (ref 5–40)

## 2022-09-27 LAB — VITAMIN D 25 HYDROXY (VIT D DEFICIENCY, FRACTURES): Vit D, 25-Hydroxy: 18.2 ng/mL — ABNORMAL LOW (ref 30.0–100.0)

## 2022-09-27 LAB — HEMOGLOBIN A1C
Est. average glucose Bld gHb Est-mCnc: 134 mg/dL
Hgb A1c MFr Bld: 6.3 % — ABNORMAL HIGH (ref 4.8–5.6)

## 2022-09-27 LAB — TSH: TSH: 1.27 u[IU]/mL (ref 0.450–4.500)

## 2022-10-04 ENCOUNTER — Other Ambulatory Visit: Payer: Self-pay | Admitting: Family Medicine

## 2022-10-04 DIAGNOSIS — E559 Vitamin D deficiency, unspecified: Secondary | ICD-10-CM

## 2022-10-04 MED ORDER — VITAMIN D (ERGOCALCIFEROL) 1.25 MG (50000 UNIT) PO CAPS: 50000.0000 [IU] | ORAL_CAPSULE | ORAL | 0 refills | Status: DC

## 2022-10-04 NOTE — Progress Notes (Signed)
Sent pt a message on MyChart

## 2022-10-17 ENCOUNTER — Encounter: Payer: Self-pay | Admitting: Family Medicine

## 2022-11-08 ENCOUNTER — Encounter: Payer: Managed Care, Other (non HMO) | Admitting: Family Medicine

## 2022-11-21 ENCOUNTER — Other Ambulatory Visit: Payer: Self-pay | Admitting: Student

## 2022-11-21 DIAGNOSIS — H53149 Visual discomfort, unspecified: Secondary | ICD-10-CM

## 2022-11-21 DIAGNOSIS — R519 Headache, unspecified: Secondary | ICD-10-CM

## 2022-11-21 DIAGNOSIS — F40298 Other specified phobia: Secondary | ICD-10-CM

## 2022-11-28 ENCOUNTER — Encounter: Payer: Self-pay | Admitting: Family Medicine

## 2022-11-28 ENCOUNTER — Ambulatory Visit
Admission: RE | Admit: 2022-11-28 | Discharge: 2022-11-28 | Disposition: A | Discharge status: Home / Self Care | Payer: Managed Care, Other (non HMO) | Attending: Family Medicine | Admitting: Family Medicine

## 2022-11-28 ENCOUNTER — Ambulatory Visit
Admission: RE | Admit: 2022-11-28 | Discharge: 2022-11-28 | Disposition: A | Discharge status: Home / Self Care | Payer: Managed Care, Other (non HMO) | Source: Ambulatory Visit | Attending: Family Medicine

## 2022-11-28 ENCOUNTER — Ambulatory Visit (INDEPENDENT_AMBULATORY_CARE_PROVIDER_SITE_OTHER): Payer: Managed Care, Other (non HMO) | Admitting: Family Medicine

## 2022-11-28 ENCOUNTER — Encounter: Payer: Managed Care, Other (non HMO) | Admitting: Family Medicine

## 2022-11-28 VITALS — BP 132/74 | HR 88 | Ht 62.0 in | Wt 286.0 lb

## 2022-11-28 DIAGNOSIS — M5416 Radiculopathy, lumbar region: Secondary | ICD-10-CM | POA: Insufficient documentation

## 2022-11-28 DIAGNOSIS — G4733 Obstructive sleep apnea (adult) (pediatric): Secondary | ICD-10-CM | POA: Diagnosis not present

## 2022-11-28 MED ORDER — DICLOFENAC SODIUM 75 MG PO TBEC: 75.0000 mg | DELAYED_RELEASE_TABLET | Freq: Two times a day (BID) | ORAL | 1 refills | Status: DC | PRN

## 2022-11-28 NOTE — Assessment & Plan Note (Signed)
Newly noted on home sleep study, given her comorbid medical history and ongoing symptomatology she has opted for treatment.  We will reach out to coordinate CPAP.

## 2022-11-28 NOTE — Assessment & Plan Note (Signed)
>>  ASSESSMENT AND PLAN FOR CHRONIC LUMBAR RADICULOPATHY WRITTEN ON 11/28/2022  5:11 PM BY Miachel Nardelli J, MD  Chronic condition in the setting of initially noted bilateral lower extremity paresthesias worst at night, back pain.  No specific treatments to date, findings are most consistent with lumbosacral radiculopathy, plan for dedicated plain films, home-based rehab, diclofenac  as needed regimen.  She will follow-up in 6 weeks for reevaluation, suboptimal progress to be addressed with consideration of advanced imaging and referral to pain and spine group.

## 2022-11-28 NOTE — Patient Instructions (Addendum)
-   You will be contacted in regards to setting up CPAP - Can use diclofenac up to 2 times a day on an as-needed basis - Start home exercise from the following website: https://orthoinfo.aaos.org/en/recovery/spine-conditioning-program/spine-conditioning-program-pdf/ - Return for follow-up in 6 weeks

## 2022-11-28 NOTE — Assessment & Plan Note (Signed)
Chronic condition in the setting of initially noted bilateral lower extremity paresthesias worst at night, back pain.  No specific treatments to date, findings are most consistent with lumbosacral radiculopathy, plan for dedicated plain films, home-based rehab, diclofenac as needed regimen.  She will follow-up in 6 weeks for reevaluation, suboptimal progress to be addressed with consideration of advanced imaging and referral to pain and spine group.

## 2022-11-28 NOTE — Progress Notes (Signed)
     Primary Care / Sports Medicine Office Visit  Patient Information:  Patient ID: Amanda Nash, female DOB: [birth year] Age: 37 y.o. MRN: [id number]   Amanda Nash is a pleasant 37 y.o. female presenting with the following:  Chief Complaint  Patient presents with   Hypertension   sleep study    Vitals:   11/28/22 1514  BP: 132/74  Pulse: 88  SpO2: 98%   Vitals:   11/28/22 1514  Weight: 286 lb (129.7 kg)  Height: 5\' 2"  (1.575 m)   Body mass index is 52.31 kg/m.  No results found.   Independent interpretation of notes and tests performed by another provider:   None  Procedures performed:   None  Pertinent History, Exam, Impression, and Recommendations:   [name] was seen today for hypertension and sleep study.  OSA (obstructive sleep apnea) Assessment & Plan: Newly noted on home sleep study, given her comorbid medical history and ongoing symptomatology she has opted for treatment.  We will reach out to coordinate CPAP.   Chronic lumbar radiculopathy Assessment & Plan: Chronic condition in the setting of initially noted bilateral lower extremity paresthesias worst at night, back pain.  No specific treatments to date, findings are most consistent with lumbosacral radiculopathy, plan for dedicated plain films, home-based rehab, diclofenac as needed regimen.  She will follow-up in 6 weeks for reevaluation, suboptimal progress to be addressed with consideration of advanced imaging and referral to pain and spine group.  Orders: -     Diclofenac Sodium; Take 1 tablet (75 mg total) by mouth 2 (two) times daily as needed.  Dispense: 60 tablet; Refill: 1 -     DG Lumbar Spine Complete; Future     Orders & Medications Meds ordered this encounter  Medications   diclofenac (VOLTAREN) 75 MG EC tablet    Sig: Take 1 tablet (75 mg total) by mouth 2 (two) times daily as needed.    Dispense:  60 tablet    Refill:  1   Orders Placed This Encounter  Procedures   DG  Lumbar Spine Complete     Return in about 6 weeks (around 01/09/2023) for FOLLOW-UP.     Montel Culver, MD, [facility]   Primary Care Sports Medicine Primary Care and Sports Medicine at [facility]

## 2022-12-04 ENCOUNTER — Ambulatory Visit: Payer: Managed Care, Other (non HMO) | Admitting: Family Medicine

## 2022-12-06 ENCOUNTER — Encounter: Payer: Self-pay | Admitting: Family Medicine

## 2022-12-07 ENCOUNTER — Other Ambulatory Visit: Payer: Self-pay | Admitting: Student in an Organized Health Care Education/Training Program

## 2022-12-07 ENCOUNTER — Other Ambulatory Visit: Payer: Self-pay | Admitting: Family Medicine

## 2022-12-07 DIAGNOSIS — M9901 Segmental and somatic dysfunction of cervical region: Secondary | ICD-10-CM

## 2022-12-07 DIAGNOSIS — M5412 Radiculopathy, cervical region: Secondary | ICD-10-CM

## 2022-12-07 DIAGNOSIS — M7542 Impingement syndrome of left shoulder: Secondary | ICD-10-CM

## 2022-12-07 NOTE — Telephone Encounter (Signed)
Requested medication (s) are due for refill today: yes  Requested medication (s) are on the active medication list: no  Last refill:  unknown  Future visit scheduled: yes  Notes to clinic:  Unable to refill per protocol, last refill by another provider.  Historical medication.     Requested Prescriptions  Pending Prescriptions Disp Refills   pantoprazole (PROTONIX) 40 MG tablet [Pharmacy Med Name: Pantoprazole Sodium 40 MG Oral Tablet Delayed Release] 30 tablet 0    Sig: Take 1 tablet by mouth once daily     Gastroenterology: Proton Pump Inhibitors Passed - 12/07/2022  5:19 AM      Passed - Valid encounter within last 12 months    Recent Outpatient Visits           1 week ago OSA (obstructive sleep apnea)   [place] [facility] at [place], Earley Abide, MD   2 months ago Primary hypertension   [place] [facility] [place], Earley Abide, MD   7 months ago Patellofemoral arthralgia of left knee   [facility] [place] [facility] [place], Earley Abide, MD   9 months ago Gastroesophageal reflux disease with esophagitis without hemorrhage   [place] [facility] at [place], Earley Abide, MD   1 year ago Cervical radiculopathy   [facility] [place] [facility], Earley Abide, MD       Future Appointments             In 1 month Zigmund Daniel, Earley Abide, MD [place] at [facility], [facility]

## 2022-12-08 ENCOUNTER — Encounter (INDEPENDENT_AMBULATORY_CARE_PROVIDER_SITE_OTHER): Payer: Managed Care, Other (non HMO) | Admitting: Family Medicine

## 2022-12-08 DIAGNOSIS — M546 Pain in thoracic spine: Secondary | ICD-10-CM | POA: Diagnosis not present

## 2022-12-08 DIAGNOSIS — M545 Low back pain, unspecified: Secondary | ICD-10-CM | POA: Diagnosis not present

## 2022-12-08 MED ORDER — CELECOXIB 100 MG PO CAPS: 100.0000 mg | ORAL_CAPSULE | Freq: Two times a day (BID) | ORAL | 1 refills | Status: DC | PRN

## 2022-12-08 NOTE — Telephone Encounter (Signed)
Please advise- xray in chart.

## 2022-12-08 NOTE — Telephone Encounter (Signed)
Please see the MyChart message reply(ies) for my assessment and plan.    This patient gave consent for this Medical Advice Message and is aware that it may result in a bill to [employer], as well as the possibility of receiving a bill for a co-payment or deductible. They are an established patient, but are not seeking medical advice exclusively about a problem treated during an in person or video visit in the last seven days. I did not recommend an in person or video visit within seven days of my reply.    I spent a total of 22 minutes cumulative time within 7 days through [employer].  Montel Culver, MD

## 2022-12-09 ENCOUNTER — Ambulatory Visit
Admission: RE | Admit: 2022-12-09 | Discharge: 2022-12-09 | Disposition: A | Discharge status: Home / Self Care | Payer: Managed Care, Other (non HMO) | Source: Ambulatory Visit | Attending: Student | Admitting: Student

## 2022-12-09 DIAGNOSIS — H53149 Visual discomfort, unspecified: Secondary | ICD-10-CM

## 2022-12-09 DIAGNOSIS — R519 Headache, unspecified: Secondary | ICD-10-CM

## 2022-12-09 DIAGNOSIS — F40298 Other specified phobia: Secondary | ICD-10-CM

## 2022-12-18 ENCOUNTER — Other Ambulatory Visit: Payer: Self-pay | Admitting: Student

## 2022-12-18 DIAGNOSIS — R200 Anesthesia of skin: Secondary | ICD-10-CM

## 2022-12-18 DIAGNOSIS — M542 Cervicalgia: Secondary | ICD-10-CM

## 2022-12-18 DIAGNOSIS — M79601 Pain in right arm: Secondary | ICD-10-CM

## 2022-12-20 ENCOUNTER — Encounter: Payer: Managed Care, Other (non HMO) | Admitting: Family Medicine

## 2022-12-28 ENCOUNTER — Encounter: Payer: Self-pay | Admitting: Neurology

## 2022-12-28 ENCOUNTER — Ambulatory Visit: Payer: Managed Care, Other (non HMO) | Admitting: Family Medicine

## 2022-12-30 ENCOUNTER — Ambulatory Visit
Admission: RE | Admit: 2022-12-30 | Discharge: 2022-12-30 | Disposition: A | Discharge status: Home / Self Care | Payer: Managed Care, Other (non HMO) | Source: Ambulatory Visit | Attending: Student

## 2022-12-30 DIAGNOSIS — M79601 Pain in right arm: Secondary | ICD-10-CM

## 2022-12-30 DIAGNOSIS — R200 Anesthesia of skin: Secondary | ICD-10-CM

## 2022-12-30 DIAGNOSIS — M542 Cervicalgia: Secondary | ICD-10-CM

## 2023-01-04 ENCOUNTER — Other Ambulatory Visit: Payer: Self-pay

## 2023-01-04 DIAGNOSIS — M545 Low back pain, unspecified: Secondary | ICD-10-CM

## 2023-01-04 DIAGNOSIS — M5416 Radiculopathy, lumbar region: Secondary | ICD-10-CM

## 2023-01-09 ENCOUNTER — Encounter: Payer: Self-pay | Admitting: Family Medicine

## 2023-01-09 ENCOUNTER — Ambulatory Visit (INDEPENDENT_AMBULATORY_CARE_PROVIDER_SITE_OTHER): Payer: Managed Care, Other (non HMO) | Admitting: Family Medicine

## 2023-01-09 VITALS — BP 122/82 | HR 110 | Ht 62.0 in

## 2023-01-09 DIAGNOSIS — Z0000 Encounter for general adult medical examination without abnormal findings: Secondary | ICD-10-CM

## 2023-01-09 DIAGNOSIS — E559 Vitamin D deficiency, unspecified: Secondary | ICD-10-CM

## 2023-01-09 DIAGNOSIS — G4733 Obstructive sleep apnea (adult) (pediatric): Secondary | ICD-10-CM

## 2023-01-09 DIAGNOSIS — Z124 Encounter for screening for malignant neoplasm of cervix: Secondary | ICD-10-CM

## 2023-01-09 DIAGNOSIS — M545 Low back pain, unspecified: Secondary | ICD-10-CM

## 2023-01-09 DIAGNOSIS — M5412 Radiculopathy, cervical region: Secondary | ICD-10-CM

## 2023-01-09 DIAGNOSIS — M546 Pain in thoracic spine: Secondary | ICD-10-CM

## 2023-01-09 DIAGNOSIS — B070 Plantar wart: Secondary | ICD-10-CM

## 2023-01-09 DIAGNOSIS — Z13220 Encounter for screening for lipoid disorders: Secondary | ICD-10-CM | POA: Diagnosis not present

## 2023-01-09 DIAGNOSIS — M5416 Radiculopathy, lumbar region: Secondary | ICD-10-CM

## 2023-01-09 MED ORDER — NYSTATIN-TRIAMCINOLONE 100000-0.1 UNIT/GM-% EX OINT: 1.0000 | TOPICAL_OINTMENT | Freq: Two times a day (BID) | CUTANEOUS | 0 refills | Status: DC

## 2023-01-09 NOTE — Assessment & Plan Note (Signed)
Started on CPAP, working on tolerating throughout the night, discussed strategies such as wearing in daytime to acclimate, consider mask changes. Can refer to sleep medicine for persistent issues.

## 2023-01-09 NOTE — Patient Instructions (Signed)
-   Obtain fasting labs with orders provided (can have water or black coffee but otherwise no food or drink x 8 hours before labs) °- Review information provided °- Attend eye doctor annually, dentist every 6 months, work towards or maintain 30 minutes of moderate intensity physical activity at least 5 days per week, and consume a balanced diet °- Return in 1 year for physical °- Contact us for any questions between now and then °

## 2023-01-09 NOTE — Assessment & Plan Note (Signed)
Annual examination completed, risk stratification labs ordered, anticipatory guidance provided.  We will follow labs once resulted. 

## 2023-01-09 NOTE — Progress Notes (Signed)
Annual Physical Exam Visit  Patient Information:  Patient ID: Amanda Nash, female DOB: [birth year] Age: 37 y.o. MRN: [id number]   Subjective:   CC: Annual Physical Exam  HPI:  Amanda Nash is here for their annual physical.  I reviewed the past medical history, family history, social history, surgical history, and allergies today and changes were made as necessary.  Please see the problem list section below for additional details.  Past Medical History: Past Medical History:  Diagnosis Date   ADHD    Anxiety    Bipolar 1 disorder (HCC) 2016   Borderline personality disorder (HCC)    Depression    Epistaxis, recurrent 02/13/2022   Hypertension    Past Surgical History: Past Surgical History:  Procedure Laterality Date   DILATATION & CURRETTAGE/HYSTEROSCOPY WITH RESECTOCOPE  2011   Family History: Family History  Problem Relation Age of Onset   Depression Mother    Anxiety disorder Mother    Heart disease Mother    Bipolar disorder Father    Hypertension Father    Diabetes Father    Depression Brother    Anxiety disorder Brother    Allergies: No Known Allergies Health Maintenance: Health Maintenance  Topic Date Due   PAP SMEAR-Modifier  12/16/2021   COVID-19 Vaccine (3 - 2023-24 season) 05/12/2022   INFLUENZA VACCINE  04/12/2023   DTaP/Tdap/Td (2 - Td or Tdap) 12/01/2028   Hepatitis C Screening  Completed   HIV Screening  Completed   HPV VACCINES  Aged Out    HM Colonoscopy     This patient has no relevant Health Maintenance data.      Medications: Current Outpatient Medications on File Prior to Visit  Medication Sig Dispense Refill   ARIPiprazole (ABILIFY) 10 MG tablet Take by mouth.     atomoxetine (STRATTERA) 80 MG capsule Take by mouth.     celecoxib (CELEBREX) 100 MG capsule Take 1 capsule (100 mg total) by mouth 2 (two) times daily as needed. 60 capsule 1   lamoTRIgine (LAMICTAL) 100 MG tablet Take by mouth.      losartan-hydrochlorothiazide (HYZAAR) 50-12.5 MG tablet Take 1 tablet by mouth daily.     nortriptyline (PAMELOR) 10 MG capsule Take nortriptyline 30 mg nightly for 1 week, then if symptoms fail to improve increase to 40 mg nightly and continue this dose.     pantoprazole (PROTONIX) 40 MG tablet Take 1 tablet by mouth once daily 30 tablet 0   propranolol (INDERAL) 20 MG tablet Take 1 tablet by mouth 2 (two) times daily.     SUMAtriptan (IMITREX) 50 MG tablet Take by mouth.     traZODone (DESYREL) 150 MG tablet Take 150 mg by mouth at bedtime.     DULoxetine (CYMBALTA) 60 MG capsule Take 1 capsule (60 mg total) by mouth daily. 30 capsule 5   levonorgestrel (MIRENA) 20 MCG/24HR IUD 1 Intra Uterine Device (1 each total) by Intrauterine route once for 1 dose. 1 each 0   No current facility-administered medications on file prior to visit.    Review of Systems: No headache, visual changes, nausea, vomiting, diarrhea, constipation, dizziness, abdominal pain, skin rash, fevers, chills, night sweats, swollen lymph nodes, weight loss, chest pain, body aches, joint swelling, muscle aches, shortness of breath, mood changes, visual or auditory hallucinations reported.  Objective:   Vitals:   01/09/23 1434  BP: 122/82  Pulse: (!) 110  SpO2: 96%   Vitals:   01/09/23 1434  Height: 5'  2" (1.575 m)   Body mass index is 52.31 kg/m.  General: Well Developed, well nourished, and in no acute distress.  Neuro: Alert and oriented x3, extra-ocular muscles intact, sensation grossly intact. Cranial nerves II through XII are grossly intact, motor, sensory, and coordinative functions are intact. HEENT: Normocephalic, atraumatic, pupils equal round reactive to light, neck supple, no masses, no lymphadenopathy, thyroid nonpalpable. Oropharynx, nasopharynx, external ear canals are unremarkable. Skin: Warm and dry, no rashes noted.  Cardiac: Regular rate and rhythm, no murmurs rubs or gallops. No peripheral edema.  Pulses symmetric. Respiratory: Clear to auscultation bilaterally. Not using accessory muscles, speaking in full sentences.  Abdominal: Soft, nontender, nondistended, positive bowel sounds, no masses, no organomegaly. Musculoskeletal: Shoulder, elbow, wrist, hip, knee, ankle stable, and with full range of motion.  Female chaperone initials: AH present throughout the physical examination.  Impression and Recommendations:   The patient was counselled, risk factors were discussed, and anticipatory guidance given.  Problem List Items Addressed This Visit       Respiratory   OSA (obstructive sleep apnea)    Started on CPAP, working on tolerating throughout the night, discussed strategies such as wearing in daytime to acclimate, consider mask changes. Can refer to sleep medicine for persistent issues.        Nervous and Auditory   Cervical radiculopathy   Relevant Orders   Ambulatory referral to Anesthesiology   Chronic lumbar radiculopathy   Relevant Orders   MR Lumbar Spine Wo Contrast   Ambulatory referral to Anesthesiology     Other   Annual physical exam   Relevant Orders   CBC   Comprehensive metabolic panel   Lipid panel   TSH   VITAMIN D 25 Hydroxy (Vit-D Deficiency, Fractures)   Healthcare maintenance - Primary    Annual examination completed, risk stratification labs ordered, anticipatory guidance provided.  We will follow labs once resulted.      Relevant Orders   CBC   Comprehensive metabolic panel   Lipid panel   TSH   VITAMIN D 25 Hydroxy (Vit-D Deficiency, Fractures)   Other Visit Diagnoses     Cervical cancer screening       Relevant Orders   Ambulatory referral to Gynecology   Screening for lipoid disorders       Relevant Orders   Comprehensive metabolic panel   Lipid panel   Vitamin D deficiency       Relevant Orders   VITAMIN D 25 Hydroxy (Vit-D Deficiency, Fractures)   Plantar warts       Relevant Medications   nystatin-triamcinolone ointment  (MYCOLOG)   Other Relevant Orders   Ambulatory referral to Podiatry   Thoracolumbar back pain       Relevant Orders   MR Thoracic Spine Wo Contrast   Ambulatory referral to Anesthesiology        Orders & Medications Medications:  Meds ordered this encounter  Medications   nystatin-triamcinolone ointment (MYCOLOG)    Sig: Apply 1 Application topically 2 (two) times daily.    Dispense:  30 g    Refill:  0   Orders Placed This Encounter  Procedures   MR Lumbar Spine Wo Contrast   MR Thoracic Spine Wo Contrast   CBC   Comprehensive metabolic panel   Lipid panel   TSH   VITAMIN D 25 Hydroxy (Vit-D Deficiency, Fractures)   Ambulatory referral to Gynecology   Ambulatory referral to Podiatry   Ambulatory referral to Anesthesiology     No  follow-ups on file.    Jerrol Banana, MD, [facility]   Primary Care Sports Medicine Primary Care and Sports Medicine at [facility]

## 2023-01-16 ENCOUNTER — Other Ambulatory Visit: Payer: Self-pay | Admitting: Family Medicine

## 2023-01-16 ENCOUNTER — Encounter: Payer: Self-pay | Admitting: Family Medicine

## 2023-01-16 LAB — COMPREHENSIVE METABOLIC PANEL
ALT: 69 IU/L — ABNORMAL HIGH (ref 0–32)
AST: 32 IU/L (ref 0–40)
Albumin/Globulin Ratio: 1.6 (ref 1.2–2.2)
Albumin: 4.2 g/dL (ref 3.9–4.9)
Alkaline Phosphatase: 80 IU/L (ref 44–121)
BUN/Creatinine Ratio: 19 (ref 9–23)
BUN: 17 mg/dL (ref 6–20)
Bilirubin Total: 0.4 mg/dL (ref 0.0–1.2)
CO2: 24 mmol/L (ref 20–29)
Calcium: 9.3 mg/dL (ref 8.7–10.2)
Chloride: 98 mmol/L (ref 96–106)
Creatinine, Ser: 0.91 mg/dL (ref 0.57–1.00)
Globulin, Total: 2.6 g/dL (ref 1.5–4.5)
Glucose: 140 mg/dL — ABNORMAL HIGH (ref 70–99)
Potassium: 4.3 mmol/L (ref 3.5–5.2)
Sodium: 136 mmol/L (ref 134–144)
Total Protein: 6.8 g/dL (ref 6.0–8.5)
eGFR: 83 mL/min/{1.73_m2} (ref >59)

## 2023-01-16 LAB — CBC
Hematocrit: 42.5 % (ref 34.0–46.6)
Hemoglobin: 13.9 g/dL (ref 11.1–15.9)
MCH: 26.9 pg (ref 26.6–33.0)
MCHC: 32.7 g/dL (ref 31.5–35.7)
MCV: 82 fL (ref 79–97)
Platelets: 358 10*3/uL (ref 150–450)
RBC: 5.16 x10E6/uL (ref 3.77–5.28)
RDW: 14.8 % (ref 11.7–15.4)
WBC: 7.5 10*3/uL (ref 3.4–10.8)

## 2023-01-16 LAB — LIPID PANEL
Chol/HDL Ratio: 3.1 ratio (ref 0.0–4.4)
Cholesterol, Total: 174 mg/dL (ref 100–199)
HDL: 56 mg/dL (ref >39)
LDL Chol Calc (NIH): 84 mg/dL (ref 0–99)
Triglycerides: 202 mg/dL — ABNORMAL HIGH (ref 0–149)
VLDL Cholesterol Cal: 34 mg/dL (ref 5–40)

## 2023-01-16 LAB — TSH: TSH: 0.969 u[IU]/mL (ref 0.450–4.500)

## 2023-01-16 LAB — VITAMIN D 25 HYDROXY (VIT D DEFICIENCY, FRACTURES): Vit D, 25-Hydroxy: 26.9 ng/mL — ABNORMAL LOW (ref 30.0–100.0)

## 2023-01-16 MED ORDER — VITAMIN D (ERGOCALCIFEROL) 1.25 MG (50000 UNIT) PO CAPS
50000.0000 [IU] | ORAL_CAPSULE | ORAL | 0 refills | Status: DC
Start: 2023-01-16 — End: 2023-07-24

## 2023-01-16 NOTE — Telephone Encounter (Signed)
Please advise 

## 2023-01-17 ENCOUNTER — Other Ambulatory Visit: Payer: Managed Care, Other (non HMO)

## 2023-01-17 ENCOUNTER — Ambulatory Visit: Payer: Managed Care, Other (non HMO)

## 2023-01-18 ENCOUNTER — Other Ambulatory Visit: Payer: Self-pay | Admitting: Family Medicine

## 2023-01-18 LAB — SPECIMEN STATUS REPORT

## 2023-01-18 LAB — HGB A1C W/O EAG: Hgb A1c MFr Bld: 6.9 % — ABNORMAL HIGH (ref 4.8–5.6)

## 2023-01-19 ENCOUNTER — Telehealth: Payer: Self-pay | Admitting: Family Medicine

## 2023-01-19 ENCOUNTER — Encounter: Payer: Self-pay | Admitting: Family Medicine

## 2023-01-19 ENCOUNTER — Telehealth: Payer: Managed Care, Other (non HMO) | Admitting: Family Medicine

## 2023-01-19 DIAGNOSIS — E782 Mixed hyperlipidemia: Secondary | ICD-10-CM

## 2023-01-19 DIAGNOSIS — E119 Type 2 diabetes mellitus without complications: Secondary | ICD-10-CM | POA: Insufficient documentation

## 2023-01-19 DIAGNOSIS — E559 Vitamin D deficiency, unspecified: Secondary | ICD-10-CM | POA: Diagnosis not present

## 2023-01-19 DIAGNOSIS — E1159 Type 2 diabetes mellitus with other circulatory complications: Secondary | ICD-10-CM

## 2023-01-19 DIAGNOSIS — Z7984 Long term (current) use of oral hypoglycemic drugs: Secondary | ICD-10-CM

## 2023-01-19 DIAGNOSIS — Z7985 Long-term (current) use of injectable non-insulin antidiabetic drugs: Secondary | ICD-10-CM

## 2023-01-19 MED ORDER — TIRZEPATIDE 2.5 MG/0.5ML ~~LOC~~ SOAJ
2.5000 mg | SUBCUTANEOUS | 0 refills | Status: DC
Start: 2023-01-19 — End: 2023-05-09

## 2023-01-19 MED ORDER — TIRZEPATIDE 5 MG/0.5ML ~~LOC~~ SOAJ
5.0000 mg | SUBCUTANEOUS | 1 refills | Status: DC
Start: 2023-01-19 — End: 2023-06-08

## 2023-01-19 NOTE — Telephone Encounter (Signed)
Left voice mail to follow up in 3 months for diabetes.

## 2023-01-19 NOTE — Patient Instructions (Signed)
-   Review information attached and make healthy lifestyle changes where applicable - Start Mounjaro 2.5 mg weekly injection x 1 month - After 1 month, start Mounjaro 5 mg weekly injection until follow-up - Check in with eye doctor and foot doctor about diabetic screening - Stop by our office a few days prior to follow-up for new labs - Return for follow-up in 3 months - Contact for any question/concerns between now and then

## 2023-01-19 NOTE — Progress Notes (Signed)
Primary Care / Sports Medicine Virtual Visit  Patient Information:  Patient ID: Amanda Nash, female DOB: 01-31-1986 Age: 37 y.o. MRN: [id number]   Amanda Nash is a pleasant 37 y.o. female presenting with the following:  Chief Complaint  Patient presents with   Diabetes    Review of Systems: No fevers, chills, night sweats, weight loss, chest pain, or shortness of breath.   Patient Active Problem List   Diagnosis Date Noted   Diabetes mellitus (HCC) 01/19/2023   Healthcare maintenance 01/09/2023   OSA (obstructive sleep apnea) 11/28/2022   Elevated fasting glucose 09/15/2022   Hyperlipidemia 09/15/2022   Annual physical exam 05/05/2022   Patellofemoral arthralgia of left knee 05/05/2022   Gastroesophageal reflux disease with esophagitis without hemorrhage 02/13/2022   Chronic lumbar radiculopathy 12/05/2021   Cervical radiculopathy 08/23/2021   Bilateral plantar wart 08/23/2021   Segmental and somatic dysfunction of cervical region 06/22/2021   Rotator cuff impingement syndrome of left shoulder 06/22/2021   Encounter for annual physical exam 05/03/2021   Carpal tunnel syndrome, bilateral 03/29/2021   Primary hypertension 10/28/2020   Chronic pelvic pain in female 02/26/2019   Prediabetes 12/24/2018   Thrombocytosis 12/24/2018   Anemia 12/24/2018   Dysmenorrhea 12/02/2018   Menorrhagia with irregular cycle 12/02/2018   Morbid obesity (HCC) 12/02/2018   Borderline personality disorder (HCC) 09/27/2016   Past Medical History:  Diagnosis Date   ADHD    Anxiety    Bipolar 1 disorder (HCC) 2016   Borderline personality disorder (HCC)    Depression    Epistaxis, recurrent 02/13/2022   Hypertension    Outpatient Encounter Medications as of 01/19/2023  Medication Sig   ARIPiprazole (ABILIFY) 10 MG tablet Take by mouth.   atomoxetine (STRATTERA) 80 MG capsule Take by mouth.   celecoxib (CELEBREX) 100 MG capsule Take 1 capsule (100 mg total) by mouth 2 (two)  times daily as needed.   lamoTRIgine (LAMICTAL) 100 MG tablet Take by mouth.   losartan-hydrochlorothiazide (HYZAAR) 50-12.5 MG tablet Take 1 tablet by mouth daily.   nortriptyline (PAMELOR) 10 MG capsule Take nortriptyline 30 mg nightly for 1 week, then if symptoms fail to improve increase to 40 mg nightly and continue this dose.   nystatin-triamcinolone ointment (MYCOLOG) Apply 1 Application topically 2 (two) times daily.   pantoprazole (PROTONIX) 40 MG tablet Take 1 tablet by mouth once daily   propranolol (INDERAL) 20 MG tablet Take 1 tablet by mouth 2 (two) times daily.   SUMAtriptan (IMITREX) 50 MG tablet Take by mouth.   tirzepatide (MOUNJARO) 2.5 MG/0.5ML Pen Inject 2.5 mg into the skin once a week.   tirzepatide [facility]) 5 MG/0.5ML Pen Inject 5 mg into the skin once a week. Start after 1 month of the 2.5 mg dose.   traZODone (DESYREL) 150 MG tablet Take 150 mg by mouth at bedtime.   Vitamin D, Ergocalciferol, (DRISDOL) 1.25 MG (50000 UNIT) CAPS capsule Take 1 capsule (50,000 Units total) by mouth every 7 (seven) days. Take for 8 total doses(weeks)   [DISCONTINUED] DULoxetine (CYMBALTA) 60 MG capsule Take 1 capsule (60 mg total) by mouth daily.   [DISCONTINUED] levonorgestrel (MIRENA) 20 MCG/24HR IUD 1 Intra Uterine Device (1 each total) by Intrauterine route once for 1 dose.   No facility-administered encounter medications on file as of 01/19/2023.   Past Surgical History:  Procedure Laterality Date   DILATATION & CURRETTAGE/HYSTEROSCOPY WITH RESECTOCOPE  2011    Virtual Visit via MyChart Video:   I  connected with Amanda Nash on 01/19/23 via MyChart Video and verified that I am speaking with the correct person using appropriate identifiers.   The limitations, risks, security and privacy concerns of performing an evaluation and management service by MyChart Video, including the higher likelihood of inaccurate diagnoses and treatments, and the availability of in person  appointments were reviewed. The possible need of an additional face-to-face encounter for complete and high quality delivery of care was discussed. The patient was also made aware that there may be a patient responsible charge related to this service. The patient expressed understanding and wishes to proceed.  Provider location is in medical facility. Patient location is at their home, different from provider location. People involved in care of the patient during this telehealth encounter were myself, my nurse/medical assistant, and my front office/scheduling team member.  Objective findings:   General: Speaking full sentences, no audible heavy breathing. Sounds alert and appropriately interactive. Well-appearing. Face symmetric. Extraocular movements intact. Pupils equal and round. No nasal flaring or accessory muscle use visualized.  Independent interpretation of notes and tests performed by another provider:   None  Pertinent History, Exam, Impression, and Recommendations:   Diabetes mellitus (HCC) New diagnosis on recent blood work, reviewed the condition and next steps in regards to evaluation and treatment. Questions were answered.  Plan as follows: - Start Mounjaro given comorbid BMI, titration after 1 month Rx sent - On ARB (losartan with HCTZ) - Updated labs including renal studies ordered for when patient returns with updated A1c - Has established with optometrist and upcoming visit with podiatry, can perform screening - Return in 3 months   Orders & Medications Meds ordered this encounter  Medications   tirzepatide (MOUNJARO) 2.5 MG/0.5ML Pen    Sig: Inject 2.5 mg into the skin once a week.    Dispense:  2 mL    Refill:  0   tirzepatide (MOUNJARO) 5 MG/0.5ML Pen    Sig: Inject 5 mg into the skin once a week. Start after 1 month of the 2.5 mg dose.    Dispense:  6 mL    Refill:  1   Orders Placed This Encounter  Procedures   Comprehensive Metabolic Panel (CMET)    Urine Microalbumin w/creat. ratio   Lipid panel   Hemoglobin A1c   VITAMIN D 25 Hydroxy (Vit-D Deficiency, Fractures)     I discussed the above assessment and treatment plan with the patient. The patient was provided an opportunity to ask questions and all were answered. The patient agreed with the plan and demonstrated an understanding of the instructions.   The patient was advised to call back or seek an in-person evaluation if the symptoms worsen or if the condition fails to improve as anticipated.   I provided a total time of 30 minutes including both face-to-face and non-face-to-face time on 01/19/2023 inclusive of time utilized for medical chart review, information gathering, care coordination with staff, and documentation completion.    Jerrol Banana, MD, [facility]   Primary Care Sports Medicine Primary Care and Sports Medicine at [facility]

## 2023-01-19 NOTE — Assessment & Plan Note (Signed)
New diagnosis on recent blood work, reviewed the condition and next steps in regards to evaluation and treatment. Questions were answered.  Plan as follows: - Start Mounjaro given comorbid BMI, titration after 1 month Rx sent - On ARB (losartan with HCTZ) - Updated labs including renal studies ordered for when patient returns with updated A1c - Has established with optometrist and upcoming visit with podiatry, can perform screening - Return in 3 months

## 2023-01-19 NOTE — Telephone Encounter (Signed)
-----   Message from Jerrol Banana, MD sent at 01/19/2023  1:53 PM EDT ----- Regarding: Please schedule follow-up in 3 months Please schedule follow-up in 3 months for diabetes

## 2023-01-27 ENCOUNTER — Ambulatory Visit
Admission: RE | Admit: 2023-01-27 | Discharge: 2023-01-27 | Disposition: A | Discharge status: Home / Self Care | Payer: Managed Care, Other (non HMO) | Source: Ambulatory Visit | Attending: Family Medicine | Admitting: Family Medicine

## 2023-01-27 DIAGNOSIS — M546 Pain in thoracic spine: Secondary | ICD-10-CM

## 2023-01-27 DIAGNOSIS — M5416 Radiculopathy, lumbar region: Secondary | ICD-10-CM

## 2023-01-30 ENCOUNTER — Ambulatory Visit (INDEPENDENT_AMBULATORY_CARE_PROVIDER_SITE_OTHER): Payer: Managed Care, Other (non HMO) | Admitting: Podiatry

## 2023-01-30 ENCOUNTER — Encounter: Payer: Self-pay | Admitting: Podiatry

## 2023-01-30 VITALS — BP 160/93 | HR 110

## 2023-01-30 DIAGNOSIS — B070 Plantar wart: Secondary | ICD-10-CM | POA: Diagnosis not present

## 2023-01-30 DIAGNOSIS — E119 Type 2 diabetes mellitus without complications: Secondary | ICD-10-CM

## 2023-01-30 NOTE — Progress Notes (Signed)
   Chief Complaint  Patient presents with   Plantar Warts    "I have a wart on my right foot.  My primary care doctor wants me to have a diabetic foot exam."    Subjective: 37 y.o. female presenting today for follow-up evaluation regarding symptomatic plantar wart to the medial aspect of the right heel.  Patient states slowly over time the wart has returned.  Patient also states that her PCP referred her for routine annual diabetic foot exam  Past Medical History:  Diagnosis Date   ADHD    Anxiety    Bipolar 1 disorder (HCC) 2016   Borderline personality disorder (HCC)    Depression    Epistaxis, recurrent 02/13/2022   Hypertension    Past Surgical History:  Procedure Laterality Date   DILATATION & CURRETTAGE/HYSTEROSCOPY WITH RESECTOCOPE  2011   No Known Allergies  Objective: Physical Exam General: The patient is alert and oriented x3 in no acute distress.   Dermatology: Hyperkeratotic skin lesion(s) noted to the plantar aspect of the right foot approximately 1 cm in diameter. Pinpoint bleeding noted upon debridement. Skin is warm, dry and supple bilateral lower extremities. Negative for open lesions or macerations.   Vascular: Palpable pedal pulses bilaterally. No edema or erythema noted. Capillary refill within normal limits.   Neurological: Light touch and protective threshold grossly intact bilaterally.    Musculoskeletal Exam: Pain on palpation to the noted skin lesion(s).  Range of motion within normal limits to all pedal and ankle joints bilateral. Muscle strength 5/5 in all groups bilateral.    Assessment: #1 plantar wart right foot #2 encounter for diabetic foot exam  -Patient was evaluated.  Comprehensive diabetic foot exam performed today -Excisional debridement of the plantar wart lesion(s) was performed using a chisel blade.  Cantharone was applied and the lesion(s) was dressed with a dry sterile dressing.  Post care instructions provided -Return to clinic 3  weeks     Felecia Shelling, DPM [facility]  Dr. Felecia Shelling, DPM    [address] [place], [place] [address]                Office [phone]  Fax [phone]

## 2023-02-02 ENCOUNTER — Ambulatory Visit: Payer: Managed Care, Other (non HMO) | Admitting: Podiatry

## 2023-02-06 ENCOUNTER — Encounter: Payer: Self-pay | Admitting: Obstetrics and Gynecology

## 2023-02-08 ENCOUNTER — Encounter: Payer: Self-pay | Admitting: Family Medicine

## 2023-02-08 NOTE — Telephone Encounter (Signed)
respond

## 2023-02-27 ENCOUNTER — Encounter: Payer: Self-pay | Admitting: Podiatry

## 2023-02-27 ENCOUNTER — Other Ambulatory Visit (HOSPITAL_COMMUNITY)
Admission: RE | Admit: 2023-02-27 | Discharge: 2023-02-27 | Disposition: A | Discharge status: Home / Self Care | Payer: Managed Care, Other (non HMO) | Source: Ambulatory Visit | Attending: Obstetrics and Gynecology | Admitting: Obstetrics and Gynecology

## 2023-02-27 ENCOUNTER — Encounter: Payer: Self-pay | Admitting: Student in an Organized Health Care Education/Training Program

## 2023-02-27 ENCOUNTER — Encounter: Payer: Self-pay | Admitting: Obstetrics and Gynecology

## 2023-02-27 ENCOUNTER — Ambulatory Visit (INDEPENDENT_AMBULATORY_CARE_PROVIDER_SITE_OTHER): Payer: Managed Care, Other (non HMO) | Admitting: Obstetrics and Gynecology

## 2023-02-27 ENCOUNTER — Ambulatory Visit (INDEPENDENT_AMBULATORY_CARE_PROVIDER_SITE_OTHER): Payer: Managed Care, Other (non HMO) | Admitting: Podiatry

## 2023-02-27 ENCOUNTER — Ambulatory Visit
Payer: Managed Care, Other (non HMO) | Attending: Student in an Organized Health Care Education/Training Program | Admitting: Student in an Organized Health Care Education/Training Program

## 2023-02-27 VITALS — BP 136/94 | HR 87 | Temp 99.0°F | Resp 17 | Ht 62.0 in | Wt 286.8 lb

## 2023-02-27 VITALS — BP 128/80 | Ht 62.0 in | Wt 289.0 lb

## 2023-02-27 DIAGNOSIS — M5136 Other intervertebral disc degeneration, lumbar region: Secondary | ICD-10-CM | POA: Insufficient documentation

## 2023-02-27 DIAGNOSIS — G8929 Other chronic pain: Secondary | ICD-10-CM | POA: Insufficient documentation

## 2023-02-27 DIAGNOSIS — B070 Plantar wart: Secondary | ICD-10-CM

## 2023-02-27 DIAGNOSIS — Z124 Encounter for screening for malignant neoplasm of cervix: Secondary | ICD-10-CM

## 2023-02-27 DIAGNOSIS — Z1151 Encounter for screening for human papillomavirus (HPV): Secondary | ICD-10-CM | POA: Insufficient documentation

## 2023-02-27 DIAGNOSIS — M545 Low back pain, unspecified: Secondary | ICD-10-CM | POA: Insufficient documentation

## 2023-02-27 DIAGNOSIS — R102 Pelvic and perineal pain: Secondary | ICD-10-CM | POA: Diagnosis not present

## 2023-02-27 DIAGNOSIS — M47816 Spondylosis without myelopathy or radiculopathy, lumbar region: Secondary | ICD-10-CM | POA: Insufficient documentation

## 2023-02-27 DIAGNOSIS — Z30431 Encounter for routine checking of intrauterine contraceptive device: Secondary | ICD-10-CM

## 2023-02-27 MED ORDER — NORETHINDRONE ACETATE 5 MG PO TABS
5.0000 mg | ORAL_TABLET | Freq: Every day | ORAL | 0 refills | Status: AC
Start: 2023-02-27 — End: ?

## 2023-02-27 NOTE — Progress Notes (Signed)
Amanda Banana, MD   Chief Complaint  Patient presents with   Referral    Pap smear and IUD check. Has had IUD severe cramping since IUD insertion.    HPI:      Ms. Amanda Nash is a 37 y.o. No obstetric history on file. whose LMP was Patient's last menstrual period was 02/05/2023 (exact date)., presents today for NP > 3 yrs eval of pelvic pain with IUD, worse recently. Pt with hx of CPP, followed by Dr. Jean Rosenthal in past. Bleeding lasted 14-28 days at that time. Mirena IUD placed for sx 01/02/19. CPP improved mostly but worse again recently. Pt has monthly RLQ or LLQ pain with ovulation (ovaries feel like exploding with glass shards), then has mild dysmen without bleeding, or 1 day light spotting with mild dsymen, or 5/24 with 5-6 days period, light flow, severe dysmen. Takes Rx pain meds anyway so can't take OTC meds for dysmen. Did muscle relaxer in past with some relief, but Rx ran out. Had discussed endometriosis and TLH BS with Dr. Jean Rosenthal 6/20. Pt would rather not have hyst. Was started on aygestin but pt doesn't remember taking it (looks like some insurance issues in chart at the time).  She is occas sexually active, always with pelvic pain. Does have vag dryness improved with lubricants.  Hx of RTO cyst on u/s 4/20 Neg pap/neg HPV DNA 12/17/18   Patient Active Problem List   Diagnosis Date Noted   Lumbar facet arthropathy 02/27/2023   Lumbar degenerative disc disease 02/27/2023   Chronic bilateral low back pain without sciatica 02/27/2023   Diabetes mellitus (HCC) 01/19/2023   Healthcare maintenance 01/09/2023   OSA (obstructive sleep apnea) 11/28/2022   Elevated fasting glucose 09/15/2022   Hyperlipidemia 09/15/2022   Annual physical exam 05/05/2022   Patellofemoral arthralgia of left knee 05/05/2022   Gastroesophageal reflux disease with esophagitis without hemorrhage 02/13/2022   Chronic lumbar radiculopathy 12/05/2021   Cervical radiculopathy 08/23/2021   Bilateral  plantar wart 08/23/2021   Segmental and somatic dysfunction of cervical region 06/22/2021   Rotator cuff impingement syndrome of left shoulder 06/22/2021   Encounter for annual physical exam 05/03/2021   Carpal tunnel syndrome, bilateral 03/29/2021   Primary hypertension 10/28/2020   Chronic pelvic pain in female 02/26/2019   Prediabetes 12/24/2018   Thrombocytosis 12/24/2018   Anemia 12/24/2018   Dysmenorrhea 12/02/2018   Menorrhagia with irregular cycle 12/02/2018   Morbid obesity (HCC) 12/02/2018   Borderline personality disorder (HCC) 09/27/2016    Past Surgical History:  Procedure Laterality Date   DILATATION & CURRETTAGE/HYSTEROSCOPY WITH RESECTOCOPE  2011    Family History  Problem Relation Age of Onset   Depression Mother    Anxiety disorder Mother    Heart disease Mother    Bipolar disorder Father    Hypertension Father    Diabetes Father    Depression Brother    Anxiety disorder Brother    Colon cancer Maternal Grandfather     Social History   Socioeconomic History   Marital status: Married    Spouse name: Aylee Vasta   Number of children: 0   Years of education: 14   Highest education level: Associate degree: academic program  Occupational History   Occupation: Public house manager    Comment: [employer]  Tobacco Use   Smoking status: Former    Types: Cigarettes   Smokeless tobacco: Never   Tobacco comments:    Vape daily - currently 01/30/2023  Vaping Use  Vaping Use: Every day  Substance and Sexual Activity   Alcohol use: Not Currently    Alcohol/week: 7.0 standard drinks of alcohol    Types: 5 Cans of beer, 2 Shots of liquor per week   Drug use: Not Currently   Sexual activity: Yes    Partners: Male    Birth control/protection: I.U.D.    Comment: Mirena  Other Topics Concern   Not on file  Social History Narrative   Live with husband and her brother, has 2 dogs and a bunny at home. Has stepdaughter that does not live with them.   Social  Determinants of Health   Financial Resource Strain: Low Risk  (12/02/2018)   Overall Financial Resource Strain (CARDIA)    Difficulty of Paying Living Expenses: Not hard at all  Food Insecurity: No Food Insecurity (12/02/2018)   Hunger Vital Sign    Worried About Running Out of Food in the Last Year: Never true    Ran Out of Food in the Last Year: Never true  Transportation Needs: No Transportation Needs (12/02/2018)   PRAPARE - Administrator, Civil Service (Medical): No    Lack of Transportation (Non-Medical): No  Physical Activity: Inactive (12/02/2018)   Exercise Vital Sign    Days of Exercise per Week: 0 days    Minutes of Exercise per Session: 0 min  Stress: Stress Concern Present (12/02/2018)   [employer] of Occupational Health - Occupational Stress Questionnaire    Feeling of Stress : Rather much  Social Connections: Moderately Integrated (12/02/2018)   Social Connection and Isolation Panel [NHANES]    Frequency of Communication with Friends and Family: More than three times a week    Frequency of Social Gatherings with Friends and Family: More than three times a week    Attends Religious Services: More than 4 times per year    Active Member of [employer] or Organizations: No    Attends Banker Meetings: Never    Marital Status: Married  Catering manager Violence: Not At Risk (12/02/2018)   Humiliation, Afraid, Rape, and Kick questionnaire    Fear of Current or Ex-Partner: No    Emotionally Abused: No    Physically Abused: No    Sexually Abused: No    Outpatient Medications Prior to Visit  Medication Sig Dispense Refill   ARIPiprazole (ABILIFY) 10 MG tablet Take by mouth.     atomoxetine (STRATTERA) 80 MG capsule Take by mouth.     celecoxib (CELEBREX) 100 MG capsule Take 1 capsule (100 mg total) by mouth 2 (two) times daily as needed. 60 capsule 1   gabapentin (NEURONTIN) 400 MG capsule Take 400 mg by mouth 2 (two) times daily.     lamoTRIgine  (LAMICTAL) 100 MG tablet Take by mouth.     levonorgestrel (MIRENA) 20 MCG/DAY IUD by Intrauterine route.     losartan-hydrochlorothiazide (HYZAAR) 50-12.5 MG tablet Take 1 tablet by mouth daily.     nortriptyline (PAMELOR) 10 MG capsule Take nortriptyline 30 mg nightly for 1 week, then if symptoms fail to improve increase to 40 mg nightly and continue this dose.     nystatin-triamcinolone ointment (MYCOLOG) Apply 1 Application topically 2 (two) times daily. 30 g 0   pantoprazole (PROTONIX) 40 MG tablet Take 1 tablet by mouth once daily 30 tablet 0   propranolol (INDERAL) 40 MG tablet Take 40 mg by mouth 2 (two) times daily.     rizatriptan (MAXALT-MLT) 5 MG disintegrating tablet Take by  mouth.     SUMAtriptan (IMITREX) 50 MG tablet Take by mouth.     tirzepatide (MOUNJARO) 2.5 MG/0.5ML Pen Inject 2.5 mg into the skin once a week. 2 mL 0   topiramate (TOPAMAX) 25 MG tablet Take 25 mg by mouth 2 (two) times daily.     traZODone (DESYREL) 150 MG tablet Take 150 mg by mouth at bedtime.     Vitamin D, Ergocalciferol, (DRISDOL) 1.25 MG (50000 UNIT) CAPS capsule Take 1 capsule (50,000 Units total) by mouth every 7 (seven) days. Take for 8 total doses(weeks) 8 capsule 0   tirzepatide (MOUNJARO) 5 MG/0.5ML Pen Inject 5 mg into the skin once a week. Start after 1 month of the 2.5 mg dose. (Patient not taking: Reported on 02/27/2023) 6 mL 1   propranolol (INDERAL) 20 MG tablet Take 1 tablet by mouth 2 (two) times daily.     No facility-administered medications prior to visit.      ROS:  Review of Systems  Constitutional:  Negative for fever.  Gastrointestinal:  Negative for blood in stool, constipation, diarrhea, nausea and vomiting.  Genitourinary:  Positive for dyspareunia and pelvic pain. Negative for dysuria, flank pain, frequency, hematuria, urgency, vaginal bleeding, vaginal discharge and vaginal pain.  Musculoskeletal:  Negative for back pain.  Skin:  Negative for rash.   BREAST: No  symptoms   OBJECTIVE:   Vitals:  BP 128/80   Ht 5\' 2"  (1.575 m)   Wt 289 lb (131.1 kg)   LMP 02/05/2023 (Exact Date)   BMI 52.86 kg/m   Physical Exam Vitals reviewed.  Constitutional:      Appearance: She is well-developed.  Pulmonary:     Effort: Pulmonary effort is normal.  Genitourinary:    General: Normal vulva.     Pubic Area: No rash.      Labia:        Right: No rash, tenderness or lesion.        Left: No rash, tenderness or lesion.      Vagina: Normal. No vaginal discharge, erythema or tenderness.     Cervix: Normal.     Uterus: Normal. Not enlarged and not tender.      Adnexa: Right adnexa normal and left adnexa normal.       Right: No mass or tenderness.         Left: No mass or tenderness.       Comments: IUD STRINGS IN CX OS Musculoskeletal:        General: Normal range of motion.     Cervical back: Normal range of motion.  Skin:    General: Skin is warm and dry.  Neurological:     General: No focal deficit present.     Mental Status: She is alert and oriented to person, place, and time.  Psychiatric:        Mood and Affect: Mood normal.        Behavior: Behavior normal.        Thought Content: Thought content normal.        Judgment: Judgment normal.    Assessment/Plan: Pelvic pain - Plan: norethindrone (AYGESTIN) 5 MG tablet, US PELVIS TRANSVAGINAL NON-OB (TV ONLY); sx increased recently, hx of ovar cysts and CPP. Will start Rx aygestin to stop ovulation pain and dysmen. Cont with IUD. Chck Gyn u/s, will f/u with results. Pt to f/u via MyChart in 3 months re: sx on aygestin/sooner prn.   Encounter for routine checking of intrauterine contraceptive device (IUD) -  Plan: US PELVIS TRANSVAGINAL NON-OB (TV ONLY); chck IUD placement with u/s  Cervical cancer screening - Plan: Cytology - PAP  Screening for HPV (human papillomavirus) - Plan: Cytology - PAP   Meds ordered this encounter  Medications   norethindrone (AYGESTIN) 5 MG tablet    Sig: Take 1  tablet (5 mg total) by mouth daily.    Dispense:  90 tablet    Refill:  0      Return in about 1 week (around 03/06/2023) for GYN u/s for IUD placement/pelvic pain---[facility] to call pt.  Jerriann Schrom B. Makynli Stills, PA-C 02/27/2023 4:38 PM

## 2023-02-27 NOTE — Patient Instructions (Signed)
I value your feedback and you entrusting us with your care. If you get a [facility] patient survey, I would appreciate you taking the time to let us know about your experience today. Thank you! ? ? ?

## 2023-02-27 NOTE — Progress Notes (Signed)
   Chief Complaint  Patient presents with   Plantar Warts    "It's been doing some weird peeling.  Other than that, it's fine."    Subjective: Patient presents today for follow-up evaluation of a plantar wart to the right lower extremity.  Last visit on 01/30/2023 Cantharone was applied.  She tolerated this well.  It was only tender for few days.  Presenting for follow-up treatment evaluation   Objective: Physical Exam General: The patient is alert and oriented x3 in no acute distress.   Dermatology: After debridement, there continues to be hyperkeratotic skin lesion noted to the plantar aspect of the right foot approximately 1 cm in diameter. Pinpoint bleeding noted upon debridement. Skin is warm, dry and supple bilateral lower extremities. Negative for open lesions or macerations.   Vascular: Palpable pedal pulses bilaterally. No edema or erythema noted. Capillary refill within normal limits.   Neurological: Grossly active via light touch   Musculoskeletal Exam: No pedal deformity   Assessment: #1 plantar wart right foot   Plan of Care:  -Patient was evaluated. -Excisional debridement of the plantar wart lesion was performed using a chisel blade.  Cantharone was reapplied and the lesion was dressed with a dry sterile dressing. -Return to clinic 3 weeks     Felecia Shelling, DPM [facility]  Dr. Felecia Shelling, DPM    [address]                                        [place], [place] [address]                Office [phone]  Fax [phone]

## 2023-02-27 NOTE — Progress Notes (Signed)
Safety precautions to be maintained throughout the outpatient stay will include: orient to surroundings, keep bed in low position, maintain call bell within reach at all times, provide assistance with transfer out of bed and ambulation.  

## 2023-02-27 NOTE — Progress Notes (Signed)
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 peripheral edema or cyanosis. No masses, redness, swelling, asymmetry, or associated skin lesions. No contractures.  Functional ROM: Unrestricted ROM           Functional ROM: Unrestricted ROM          Muscle Tone/Strength: Functionally intact. No obvious neuro-muscular anomalies detected.   Muscle Tone/Strength: Functionally intact. No obvious neuro-muscular anomalies detected.  Sensory (Neurological): Unimpaired           Sensory (Neurological): Unimpaired          Palpation: No palpable anomalies               Palpation: No palpable anomalies              Provocative Test(s):  Phalen's test:  deferred Tinel's test: deferred Apley's scratch test (touch opposite shoulder):  Action 1 (Across chest): deferred Action 2 (Overhead): deferred Action 3 (LB reach): deferred     Provocative Test(s):  Phalen's test: deferred Tinel's test: deferred Apley's scratch test (touch opposite shoulder):  Action 1 (Across chest): deferred Action 2 (Overhead): deferred Action 3 (LB reach): deferred    Lumbar Spine Area Exam  Skin & Axial Inspection: No masses, redness, or swelling Alignment: Symmetrical Functional ROM: Unrestricted ROM       Stability: No instability detected Muscle Tone/Strength:  Functionally intact. No obvious neuro-muscular anomalies detected. Sensory (Neurological): Musculoskeletal pain pattern Pain with lumbar facet loading   Gait & Posture Assessment  Ambulation: Unassisted Gait: Relatively normal for age and body habitus Posture: WNL  Lower Extremity Exam      Side: Right lower extremity   Side: Left lower extremity  Stability: No instability observed           Stability: No instability observed          Skin & Extremity Inspection: Skin color, temperature, and hair growth are WNL. No peripheral edema or cyanosis. No masses, redness, swelling, asymmetry, or associated skin lesions. No contractures.   Skin & Extremity Inspection: Skin color, temperature, and hair growth are WNL. No peripheral edema or cyanosis. No masses, redness, swelling, asymmetry, or associated skin lesions. No contractures.  Functional ROM: Unrestricted ROM                   Functional ROM: Unrestricted ROM                  Muscle Tone/Strength: Functionally intact. No obvious neuro-muscular anomalies detected.   Muscle Tone/Strength: Functionally intact. No obvious neuro-muscular anomalies detected.  Sensory (Neurological): Unimpaired         Sensory (Neurological): Unimpaired        DTR: Patellar: deferred today Achilles: deferred today Plantar: deferred today   DTR: Patellar: deferred  today Achilles: deferred today Plantar: deferred today  Palpation: No palpable anomalies   Palpation: No palpable anomalies       Assessment  Primary Diagnosis & Pertinent Problem List: The primary encounter diagnosis was Lumbar facet arthropathy. Diagnoses of Lumbar degenerative disc disease and Chronic bilateral low back pain without sciatica were also pertinent to this visit.  Visit Diagnosis (New problems to examiner): 1. Lumbar facet arthropathy   2. Lumbar degenerative disc disease   3. Chronic bilateral low back pain without sciatica    Plan of Care (Initial workup plan)  Amanda Nash has a history of greater than 3 months of moderate to severe pain which is resulted in functional impairment.  The patient has tried various conservative therapeutic options such as NSAIDs, Tylenol, muscle relaxants, physical therapy which was inadequately effective.  Patient's pain is predominantly axial with physical exam and L-MRI findings suggestive of facet arthropathy.Lumbar facet medial branch nerve blocks were discussed with the patient.  Risks and benefits were reviewed.  Patient would like to proceed with bilateral L3, L4, L5 medial branch nerve block.  We also discussed Qutenza for bilateral neuropathic pain in feet related to diabetes.   Procedure Orders         LUMBAR FACET(MEDIAL BRANCH NERVE BLOCK) MBNB     Provider-requested follow-up: Return in about 22 days (around 03/21/2023) for B/L L3, 4, 5 MBNB, in clinic [facility].  Future Appointments  Date Time Provider Department Center  02/27/2023 11:15 AM Felecia Shelling, DPM [facility]  02/27/2023  3:55 PM Copland, Ilona Sorrel, PA-C [facility] None  01/11/2024  8:00 AM Jerrol Banana, MD [facility]    Duration of encounter: .  Total time on encounter, as per AMA guidelines included both the face-to-face and non-face-to-face time personally spent by the physician and/or other qualified health care professional(s) on the day  of the encounter (includes time in activities that require the physician or other qualified health care professional and does not include time in activities normally performed by clinical staff). Physician's time may include the following activities  when performed: Preparing to see the patient (e.g., pre-charting review of records, searching for previously ordered imaging, lab work, and nerve conduction tests) Review of prior analgesic pharmacotherapies. Reviewing PMP Interpreting ordered tests (e.g., lab work, imaging, nerve conduction tests) Performing post-procedure evaluations, including interpretation of diagnostic procedures Obtaining and/or reviewing separately obtained history Performing a medically appropriate examination and/or evaluation Counseling and educating the patient/family/caregiver Ordering medications, tests, or procedures Referring and communicating with other health care professionals (when not separately reported) Documenting clinical information in the electronic or other health record Independently interpreting results (not separately reported) and communicating results to the patient/ family/caregiver Care coordination (not separately reported)  Note by: Edward Jolly, MD (TTS technology used. I apologize for any typographical errors that were not detected and corrected.) Date: 02/27/2023; Time: 9:41 AM

## 2023-02-27 NOTE — Patient Instructions (Signed)
GENERAL RISKS AND COMPLICATIONS  What are the risk, side effects and possible complications? Generally speaking, most procedures are safe.  However, with any procedure there are risks, side effects, and the possibility of complications.  The risks and complications are dependent upon the sites that are lesioned, or the type of nerve block to be performed.  The closer the procedure is to the spine, the more serious the risks are.  Great care is taken when placing the radio frequency needles, block needles or lesioning probes, but sometimes complications can occur. Infection: Any time there is an injection through the skin, there is a risk of infection.  This is why sterile conditions are used for these blocks.  There are four possible types of infection. Localized skin infection. Central Nervous System Infection-This can be in the form of Meningitis, which can be deadly. Epidural Infections-This can be in the form of an epidural abscess, which can cause pressure inside of the spine, causing compression of the spinal cord with subsequent paralysis. This would require an emergency surgery to decompress, and there are no guarantees that the patient would recover from the paralysis. Discitis-This is an infection of the intervertebral discs.  It occurs in about 1% of discography procedures.  It is difficult to treat and it may lead to surgery.        2. Pain: the needles have to go through skin and soft tissues, will cause soreness.       3. Damage to internal structures:  The nerves to be lesioned may be near blood vessels or    other nerves which can be potentially damaged.       4. Bleeding: Bleeding is more common if the patient is taking blood thinners such as  aspirin, Coumadin, Ticiid, Plavix, etc., or if he/she have some genetic predisposition  such as hemophilia. Bleeding into the spinal canal can cause compression of the spinal  cord with subsequent paralysis.  This would require an emergency  surgery to  decompress and there are no guarantees that the patient would recover from the  paralysis.       5. Pneumothorax:  Puncturing of a lung is a possibility, every time a needle is introduced in  the area of the chest or upper back.  Pneumothorax refers to free air around the  collapsed lung(s), inside of the thoracic cavity (chest cavity).  Another two possible  complications related to a similar event would include: Hemothorax and Chylothorax.   These are variations of the Pneumothorax, where instead of air around the collapsed  lung(s), you may have blood or chyle, respectively.       6. Spinal headaches: They may occur with any procedures in the area of the spine.       7. Persistent CSF (Cerebro-Spinal Fluid) leakage: This is a rare problem, but may occur  with prolonged intrathecal or epidural catheters either due to the formation of a fistulous  track or a dural tear.       8. Nerve damage: By working so close to the spinal cord, there is always a possibility of  nerve damage, which could be as serious as a permanent spinal cord injury with  paralysis.       9. Death:  Although rare, severe deadly allergic reactions known as "Anaphylactic  reaction" can occur to any of the medications used.      10. Worsening of the symptoms:  We can always make thing worse.  What are the chances   of something like this happening? Chances of any of this occuring are extremely low.  By statistics, you have more of a chance of getting killed in a motor vehicle accident: while driving to the hospital than any of the above occurring .  Nevertheless, you should be aware that they are possibilities.  In general, it is similar to taking a shower.  Everybody knows that you can slip, hit your head and get killed.  Does that mean that you should not shower again?  Nevertheless always keep in mind that statistics do not mean anything if you happen to be on the wrong side of them.  Even if a procedure has a 1 (one) in a  1,000,000 (million) chance of going wrong, it you happen to be that one..Also, keep in mind that by statistics, you have more of a chance of having something go wrong when taking medications.  Who should not have this procedure? If you are on a blood thinning medication (e.g. Coumadin, Plavix, see list of "Blood Thinners"), or if you have an active infection going on, you should not have the procedure.  If you are taking any blood thinners, please inform your physician.  How should I prepare for this procedure? Do not eat or drink anything at least six hours prior to the procedure. Bring a driver with you .  It cannot be a taxi. Come accompanied by an adult that can drive you back, and that is strong enough to help you if your legs get weak or numb from the local anesthetic. Take all of your medicines the morning of the procedure with just enough water to swallow them. If you have diabetes, make sure that you are scheduled to have your procedure done first thing in the morning, whenever possible. If you have diabetes, take only half of your insulin dose and notify our nurse that you have done so as soon as you arrive at the clinic. If you are diabetic, but only take blood sugar pills (oral hypoglycemic), then do not take them on the morning of your procedure.  You may take them after you have had the procedure. Do not take aspirin or any aspirin-containing medications, at least eleven (11) days prior to the procedure.  They may prolong bleeding. Wear loose fitting clothing that may be easy to take off and that you would not mind if it got stained with Betadine or blood. Do not wear any jewelry or perfume Remove any nail coloring.  It will interfere with some of our monitoring equipment.  NOTE: Remember that this is not meant to be interpreted as a complete list of all possible complications.  Unforeseen problems may occur.  BLOOD THINNERS The following drugs contain aspirin or other products,  which can cause increased bleeding during surgery and should not be taken for 2 weeks prior to and 1 week after surgery.  If you should need take something for relief of minor pain, you may take acetaminophen which is found in Tylenol,m Datril, Anacin-3 and Panadol. It is not blood thinner. The products listed below are.  Do not take any of the products listed below in addition to any listed on your instruction sheet.  A.P.C or A.P.C with Codeine Codeine Phosphate Capsules #3 Ibuprofen Ridaura  ABC compound Congesprin Imuran rimadil  Advil Cope Indocin Robaxisal  Alka-Seltzer Effervescent Pain Reliever and Antacid Coricidin or Coricidin-D  Indomethacin Rufen  Alka-Seltzer plus Cold Medicine Cosprin Ketoprofen S-A-C Tablets  Anacin Analgesic Tablets or Capsules Coumadin   Korlgesic Salflex  Anacin Extra Strength Analgesic tablets or capsules CP-2 Tablets Lanoril Salicylate  Anaprox Cuprimine Capsules Levenox Salocol  Anexsia-D Dalteparin Magan Salsalate  Anodynos Darvon compound Magnesium Salicylate Sine-off  Ansaid Dasin Capsules Magsal Sodium Salicylate  Anturane Depen Capsules Marnal Soma  APF Arthritis pain formula Dewitt's Pills Measurin Stanback  Argesic Dia-Gesic Meclofenamic Sulfinpyrazone  Arthritis Bayer Timed Release Aspirin Diclofenac Meclomen Sulindac  Arthritis pain formula Anacin Dicumarol Medipren Supac  Analgesic (Safety coated) Arthralgen Diffunasal Mefanamic Suprofen  Arthritis Strength Bufferin Dihydrocodeine Mepro Compound Suprol  Arthropan liquid Dopirydamole Methcarbomol with Aspirin Synalgos  ASA tablets/Enseals Disalcid Micrainin Tagament  Ascriptin Doan's Midol Talwin  Ascriptin A/D Dolene Mobidin Tanderil  Ascriptin Extra Strength Dolobid Moblgesic Ticlid  Ascriptin with Codeine Doloprin or Doloprin with Codeine Momentum Tolectin  Asperbuf Duoprin Mono-gesic Trendar  Aspergum Duradyne Motrin or Motrin IB Triminicin  Aspirin plain, buffered or enteric coated  Durasal Myochrisine Trigesic  Aspirin Suppositories Easprin Nalfon Trillsate  Aspirin with Codeine Ecotrin Regular or Extra Strength Naprosyn Uracel  Atromid-S Efficin Naproxen Ursinus  Auranofin Capsules Elmiron Neocylate Vanquish  Axotal Emagrin Norgesic Verin  Azathioprine Empirin or Empirin with Codeine Normiflo Vitamin E  Azolid Emprazil Nuprin Voltaren  Bayer Aspirin plain, buffered or children's or timed BC Tablets or powders Encaprin Orgaran Warfarin Sodium  Buff-a-Comp Enoxaparin Orudis Zorpin  Buff-a-Comp with Codeine Equegesic Os-Cal-Gesic   Buffaprin Excedrin plain, buffered or Extra Strength Oxalid   Bufferin Arthritis Strength Feldene Oxphenbutazone   Bufferin plain or Extra Strength Feldene Capsules Oxycodone with Aspirin   Bufferin with Codeine Fenoprofen Fenoprofen Pabalate or Pabalate-SF   Buffets II Flogesic Panagesic   Buffinol plain or Extra Strength Florinal or Florinal with Codeine Panwarfarin   Buf-Tabs Flurbiprofen Penicillamine   Butalbital Compound Four-way cold tablets Penicillin   Butazolidin Fragmin Pepto-Bismol   Carbenicillin Geminisyn Percodan   Carna Arthritis Reliever Geopen Persantine   Carprofen Gold's salt Persistin   Chloramphenicol Goody's Phenylbutazone   Chloromycetin Haltrain Piroxlcam   Clmetidine heparin Plaquenil   Cllnoril Hyco-pap Ponstel   Clofibrate Hydroxy chloroquine Propoxyphen         Before stopping any of these medications, be sure to consult the physician who ordered them.  Some, such as Coumadin (Warfarin) are ordered to prevent or treat serious conditions such as "deep thrombosis", "pumonary embolisms", and other heart problems.  The amount of time that you may need off of the medication may also vary with the medication and the reason for which you were taking it.  If you are taking any of these medications, please make sure you notify your pain physician before you undergo any procedures.         Facet Blocks Patient  Information  Description: The facets are joints in the spine between the vertebrae.  Like any joints in the body, facets can become irritated and painful.  Arthritis can also effect the facets.  By injecting steroids and local anesthetic in and around these joints, we can temporarily block the nerve supply to them.  Steroids act directly on irritated nerves and tissues to reduce selling and inflammation which often leads to decreased pain.  Facet blocks may be done anywhere along the spine from the neck to the low back depending upon the location of your pain.   After numbing the skin with local anesthetic (like Novocaine), a small needle is passed onto the facet joints under x-ray guidance.  You may experience a sensation of pressure while this is being done.  The   entire block usually lasts about 15-25 minutes.   Conditions which may be treated by facet blocks:  Low back/buttock pain Neck/shoulder pain Certain types of headaches  Preparation for the injection:  Do not eat any solid food or dairy products within 8 hours of your appointment. You may drink clear liquid up to 3 hours before appointment.  Clear liquids include water, black coffee, juice or soda.  No milk or cream please. You may take your regular medication, including pain medications, with a sip of water before your appointment.  Diabetics should hold regular insulin (if taken separately) and take 1/2 normal NPH dose the morning of the procedure.  Carry some sugar containing items with you to your appointment. A driver must accompany you and be prepared to drive you home after your procedure. Bring all your current medications with you. An IV may be inserted and sedation may be given at the discretion of the physician. A blood pressure cuff, EKG and other monitors will often be applied during the procedure.  Some patients may need to have extra oxygen administered for a short period. You will be asked to provide medical information,  including your allergies and medications, prior to the procedure.  We must know immediately if you are taking blood thinners (like Coumadin/Warfarin) or if you are allergic to IV iodine contrast (dye).  We must know if you could possible be pregnant.  Possible side-effects:  Bleeding from needle site Infection (rare, may require surgery) Nerve injury (rare) Numbness & tingling (temporary) Difficulty urinating (rare, temporary) Spinal headache (a headache worse with upright posture) Light-headedness (temporary) Pain at injection site (serveral days) Decreased blood pressure (rare, temporary) Weakness in arm/leg (temporary) Pressure sensation in back/neck (temporary)   Call if you experience:  Fever/chills associated with headache or increased back/neck pain Headache worsened by an upright position New onset, weakness or numbness of an extremity below the injection site Hives or difficulty breathing (go to the emergency room) Inflammation or drainage at the injection site(s) Severe back/neck pain greater than usual New symptoms which are concerning to you  Please note:  Although the local anesthetic injected can often make your back or neck feel good for several hours after the injection, the pain will likely return. It takes 3-7 days for steroids to work.  You may not notice any pain relief for at least one week.  If effective, we will often do a series of 2-3 injections spaced 3-6 weeks apart to maximally decrease your pain.  After the initial series, you may be a candidate for a more permanent nerve block of the facets.  If you have any questions, please call [phone] [facility] 

## 2023-03-01 LAB — CYTOLOGY - PAP
Comment: NEGATIVE
Diagnosis: NEGATIVE
High risk HPV: NEGATIVE

## 2023-03-20 ENCOUNTER — Ambulatory Visit: Payer: Managed Care, Other (non HMO) | Admitting: Podiatry

## 2023-03-20 DIAGNOSIS — B070 Plantar wart: Secondary | ICD-10-CM

## 2023-03-20 NOTE — Progress Notes (Signed)
   Chief Complaint  Patient presents with   Plantar Warts    "It's been doing some weird peeling.  Other than that, it's fine."    Subjective: Patient presents today for follow-up evaluation of a plantar wart to the right lower extremity.  Last visit on 02/27/2023 Cantharone was applied.  She tolerated this well.  It was only tender for few days.  Presenting for follow-up treatment evaluation   Objective: Physical Exam General: The patient is alert and oriented x3 in no acute distress.   Dermatology: After debridement, there continues to be hyperkeratotic skin lesion noted to the plantar aspect of the right foot approximately 1 cm in diameter. Pinpoint bleeding noted upon debridement. Skin is warm, dry and supple bilateral lower extremities. Negative for open lesions or macerations.   Vascular: Palpable pedal pulses bilaterally. No edema or erythema noted. Capillary refill within normal limits.   Neurological: Grossly active via light touch   Musculoskeletal Exam: No pedal deformity   Assessment: #1 plantar wart right foot   Plan of Care:  -Patient was evaluated. -Excisional debridement of the plantar wart lesion was performed using a chisel blade.  Cantharone was reapplied and the lesion was dressed with a dry sterile dressing. 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 Felecia Shelling, DPM    [address]                                        [place], [place] [address]                Office [phone]  Fax [phone]

## 2023-03-21 ENCOUNTER — Ambulatory Visit: Payer: Managed Care, Other (non HMO) | Admitting: Student in an Organized Health Care Education/Training Program

## 2023-03-29 ENCOUNTER — Ambulatory Visit: Payer: Managed Care, Other (non HMO)

## 2023-03-29 DIAGNOSIS — R102 Pelvic and perineal pain: Secondary | ICD-10-CM

## 2023-03-29 DIAGNOSIS — Z30431 Encounter for routine checking of intrauterine contraceptive device: Secondary | ICD-10-CM

## 2023-03-29 DIAGNOSIS — N939 Abnormal uterine and vaginal bleeding, unspecified: Secondary | ICD-10-CM

## 2023-04-03 ENCOUNTER — Telehealth: Payer: Self-pay | Admitting: Obstetrics and Gynecology

## 2023-04-03 NOTE — Telephone Encounter (Signed)
LM with results and sent MyChart msg. On aygestin now to help with ovar cyst prevention/ovulatory pain. No further f/u needed for RTO cyst. IUD almost in correct position but shouldn't cause sx for pt. Pt to f/u re: pain ~9/24 after 3 months aygestin.

## 2023-04-04 ENCOUNTER — Ambulatory Visit
Admission: RE | Admit: 2023-04-04 | Discharge: 2023-04-04 | Disposition: A | Discharge status: Home / Self Care | Payer: Managed Care, Other (non HMO) | Source: Ambulatory Visit | Attending: Student in an Organized Health Care Education/Training Program | Admitting: Student in an Organized Health Care Education/Training Program

## 2023-04-04 ENCOUNTER — Encounter: Payer: Self-pay | Admitting: Student in an Organized Health Care Education/Training Program

## 2023-04-04 ENCOUNTER — Ambulatory Visit
Payer: Managed Care, Other (non HMO) | Attending: Student in an Organized Health Care Education/Training Program | Admitting: Student in an Organized Health Care Education/Training Program

## 2023-04-04 DIAGNOSIS — M47816 Spondylosis without myelopathy or radiculopathy, lumbar region: Secondary | ICD-10-CM | POA: Insufficient documentation

## 2023-04-04 DIAGNOSIS — M545 Low back pain, unspecified: Secondary | ICD-10-CM | POA: Insufficient documentation

## 2023-04-04 DIAGNOSIS — G8929 Other chronic pain: Secondary | ICD-10-CM | POA: Diagnosis present

## 2023-04-04 MED ORDER — ROPIVACAINE HCL 2 MG/ML IJ SOLN
INTRAMUSCULAR | Status: AC
  Filled 2023-04-04: qty 20

## 2023-04-04 MED ORDER — DEXAMETHASONE SODIUM PHOSPHATE 10 MG/ML IJ SOLN
INTRAMUSCULAR | Status: AC
  Filled 2023-04-04: qty 2

## 2023-04-04 MED ORDER — LIDOCAINE HCL 2 % IJ SOLN
20.0000 mL | Freq: Once | INTRAMUSCULAR | Status: AC
  Administered 2023-04-04: 400 mg

## 2023-04-04 MED ORDER — LIDOCAINE HCL 2 % IJ SOLN
INTRAMUSCULAR | Status: AC
  Filled 2023-04-04: qty 20

## 2023-04-04 MED ORDER — DEXAMETHASONE SODIUM PHOSPHATE 10 MG/ML IJ SOLN
10.0000 mg | Freq: Once | INTRAMUSCULAR | Status: AC
  Administered 2023-04-04: 10 mg

## 2023-04-04 MED ORDER — ROPIVACAINE HCL 2 MG/ML IJ SOLN
9.0000 mL | Freq: Once | INTRAMUSCULAR | Status: AC
  Administered 2023-04-04: 9 mL via PERINEURAL

## 2023-04-04 NOTE — Progress Notes (Signed)
Safety precautions to be maintained throughout the outpatient stay will include: orient to surroundings, keep bed in low position, maintain call bell within reach at all times, provide assistance with transfer out of bed and ambulation.  

## 2023-04-04 NOTE — Progress Notes (Signed)
PROVIDER NOTE: Interpretation of information contained herein should be left to medically-trained personnel. Specific patient instructions are provided elsewhere under "Patient Instructions" section of medical record. This document was created in part using STT-dictation technology, any transcriptional errors that may result from this process are unintentional.  Patient: Amanda Nash Type: Established DOB: 1986-07-20 MRN: [id number] PCP: Jerrol Banana, MD  Service: Procedure DOS: 04/04/2023 Setting: Ambulatory Location: Ambulatory outpatient facility Delivery: Face-to-face Provider: Edward Jolly, MD Specialty: Interventional Pain Management Specialty designation: 09 Location: Outpatient facility Ref. Prov.: Edward Jolly, MD       Interventional Therapy   Procedure: Lumbar Facet, Medial Branch Block(s) #1  Laterality: Bilateral  Level: L3, L4, and L5 Medial Branch Level(s). Injecting these levels blocks the L3-4 and L4-5 lumbar facet joints. Imaging: Fluoroscopic guidance         Anesthesia: Local anesthesia (1-2% Lidocaine) DOS: 04/04/2023 Performed by: Edward Jolly, MD  Primary Purpose: Diagnostic/Therapeutic Indications: Low back pain severe enough to impact quality of life or function. 1. Lumbar facet arthropathy   2. Chronic bilateral low back pain without sciatica    NAS-11 Pain score:   Pre-procedure: 4 /10   Post-procedure: 4 /10     Position / Prep / Materials:  Position: Prone  Prep solution: DuraPrep (Iodine Povacrylex [0.7% available iodine] and Isopropyl Alcohol, 74% w/w) Area Prepped: Posterolateral Lumbosacral Spine (Wide prep: From the lower border of the scapula down to the end of the tailbone and from flank to flank.)  Materials:  Tray: Block Needle(s):  Type: Spinal  Gauge (G): 22  Length: 5-in Qty: 3      H&P (Pre-op Assessment):  Amanda Nash is a 37 y.o. (year old), female patient, seen today for interventional treatment. She  has a past  surgical history that includes Dilatation & currettage/hysteroscopy with resectoscope (2011). Amanda Nash has a current medication list which includes the following prescription(s): aripiprazole, atomoxetine, celecoxib, gabapentin, lamotrigine, levonorgestrel, losartan-hydrochlorothiazide, norethindrone, nortriptyline, nystatin-triamcinolone ointment, pantoprazole, propranolol, rizatriptan, sumatriptan, tirzepatide, topiramate, trazodone, vitamin d (ergocalciferol), and tirzepatide. Her primarily concern today is the Back Pain  Initial Vital Signs:  Pulse/HCG Rate: 97ECG Heart Rate: 87 Temp: 98.2 F (36.8 C) Resp: 17 BP: (!) 142/90 SpO2: 98 %  BMI: Estimated body mass index is 50.3 kg/m as calculated from the following:   Height as of this encounter: 5\' 2"  (1.575 m).   Weight as of this encounter: 275 lb (124.7 kg).  Risk Assessment: Allergies: Reviewed. She has No Known Allergies.  Allergy Precautions: None required Coagulopathies: Reviewed. None identified.  Blood-thinner therapy: None at this time Active Infection(s): Reviewed. None identified. Amanda Nash is afebrile  Site Confirmation: Amanda Nash was asked to confirm the procedure and laterality before marking the site Procedure checklist: Completed Consent: Before the procedure and under the influence of no sedative(s), amnesic(s), or anxiolytics, the patient was informed of the treatment options, risks and possible complications. To fulfill our ethical and legal obligations, as recommended by the American Medical Association's Code of Ethics, I have informed the patient of my clinical impression; the nature and purpose of the treatment or procedure; the risks, benefits, and possible complications of the intervention; the alternatives, including doing nothing; the risk(s) and benefit(s) of the alternative treatment(s) or procedure(s); and the risk(s) and benefit(s) of doing nothing. The patient was provided information about the general  risks and possible complications associated with the procedure. These may include, but are not limited to: failure to achieve desired goals, infection, bleeding, organ or nerve  damage, allergic reactions, paralysis, and death. In addition, the patient was informed of those risks and complications associated to Spine-related procedures, such as failure to decrease pain; infection (i.e.: Meningitis, epidural or intraspinal abscess); bleeding (i.e.: epidural hematoma, subarachnoid hemorrhage, or any other type of intraspinal or peri-dural bleeding); organ or nerve damage (i.e.: Any type of peripheral nerve, nerve root, or spinal cord injury) with subsequent damage to sensory, motor, and/or autonomic systems, resulting in permanent pain, numbness, and/or weakness of one or several areas of the body; allergic reactions; (i.e.: anaphylactic reaction); and/or death. Furthermore, the patient was informed of those risks and complications associated with the medications. These include, but are not limited to: allergic reactions (i.e.: anaphylactic or anaphylactoid reaction(s)); adrenal axis suppression; blood sugar elevation that in diabetics may result in ketoacidosis or comma; water retention that in patients with history of congestive heart failure may result in shortness of breath, pulmonary edema, and decompensation with resultant heart failure; weight gain; swelling or edema; medication-induced neural toxicity; particulate matter embolism and blood vessel occlusion with resultant organ, and/or nervous system infarction; and/or aseptic necrosis of one or more joints. Finally, the patient was informed that Medicine is not an exact science; therefore, there is also the possibility of unforeseen or unpredictable risks and/or possible complications that may result in a catastrophic outcome. The patient indicated having understood very clearly. We have given the patient no guarantees and we have made no promises. Enough  time was given to the patient to ask questions, all of which were answered to the patient's satisfaction. Amanda Nash has indicated that she wanted to continue with the procedure. Attestation: I, the ordering provider, attest that I have discussed with the patient the benefits, risks, side-effects, alternatives, likelihood of achieving goals, and potential problems during recovery for the procedure that I have provided informed consent. Date  Time: 04/04/2023  1:42 PM   Pre-Procedure Preparation:  Monitoring: As per clinic protocol. Respiration, ETCO2, SpO2, BP, heart rate and rhythm monitor placed and checked for adequate function Safety Precautions: Patient was assessed for positional comfort and pressure points before starting the procedure. Time-out: I initiated and conducted the "Time-out" before starting the procedure, as per protocol. The patient was asked to participate by confirming the accuracy of the "Time Out" information. Verification of the correct person, site, and procedure were performed and confirmed by me, the nursing staff, and the patient. "Time-out" conducted as per Joint Commission's Universal Protocol (UP.01.01.01). Time: 1413 Start Time: 1413 hrs.  Description of Procedure:          Laterality: (see above) Targeted Levels: (see above)  Safety Precautions: Aspiration looking for blood return was conducted prior to all injections. At no point did we inject any substances, as a needle was being advanced. Before injecting, the patient was told to immediately notify me if she was experiencing any new onset of "ringing in the ears, or metallic taste in the mouth". No attempts were made at seeking any paresthesias. Safe injection practices and needle disposal techniques used. Medications properly checked for expiration dates. SDV (single dose vial) medications used. After the completion of the procedure, all disposable equipment used was discarded in the proper designated medical  waste containers. Local Anesthesia: Protocol guidelines were followed. The patient was positioned over the fluoroscopy table. The area was prepped in the usual manner. The time-out was completed. The target area was identified using fluoroscopy. A 12-in long, straight, sterile hemostat was used with fluoroscopic guidance to locate the targets for each  level blocked. Once located, the skin was marked with an approved surgical skin marker. Once all sites were marked, the skin (epidermis, dermis, and hypodermis), as well as deeper tissues (fat, connective tissue and muscle) were infiltrated with a small amount of a short-acting local anesthetic, loaded on a 10cc syringe with a 25G, 1.5-in  Needle. An appropriate amount of time was allowed for local anesthetics to take effect before proceeding to the next step. Local Anesthetic: Lidocaine 2.0% The unused portion of the local anesthetic was discarded in the proper designated containers. Technical description of process:   L3 Medial Branch Nerve Block (MBB): The target area for the L3 medial branch is at the junction of the postero-lateral aspect of the superior articular process and the superior, posterior, and medial edge of the transverse process of L4. Under fluoroscopic guidance, a Quincke needle was inserted until contact was made with os over the superior postero-lateral aspect of the pedicular shadow (target area). After negative aspiration for blood, 2 mL of the nerve block solution was injected without difficulty or complication. The needle was removed intact. L4 Medial Branch Nerve Block (MBB): The target area for the L4 medial branch is at the junction of the postero-lateral aspect of the superior articular process and the superior, posterior, and medial edge of the transverse process of L5. Under fluoroscopic guidance, a Quincke needle was inserted until contact was made with os over the superior postero-lateral aspect of the pedicular shadow (target  area). After negative aspiration for blood, 2mL of the nerve block solution was injected without difficulty or complication. The needle was removed intact. L5 Medial Branch Nerve Block (MBB): The target area for the L5 medial branch is at the junction of the postero-lateral aspect of the superior articular process and the superior, posterior, and medial edge of the sacral ala. Under fluoroscopic guidance, a Quincke needle was inserted until contact was made with os over the superior postero-lateral aspect of the pedicular shadow (target area). After negative aspiration for blood, 2mL of the nerve block solution was injected without difficulty or complication. The needle was removed intact.   Once the entire procedure was completed, the treated area was cleaned, making sure to leave some of the prepping solution back to take advantage of its long term bactericidal properties.         Illustration of the posterior view of the lumbar spine and the posterior neural structures. Laminae of L2 through S1 are labeled. DPRL5, dorsal primary ramus of L5; DPRS1, dorsal primary ramus of S1; DPR3, dorsal primary ramus of L3; FJ, facet (zygapophyseal) joint L3-L4; I, inferior articular process of L4; LB1, lateral branch of dorsal primary ramus of L1; IAB, inferior articular branches from L3 medial branch (supplies L4-L5 facet joint); IBP, intermediate branch plexus; MB3, medial branch of dorsal primary ramus of L3; NR3, third lumbar nerve root; S, superior articular process of L5; SAB, superior articular branches from L4 (supplies L4-5 facet joint also); TP3, transverse process of L3.   Facet Joint Innervation (* possible contribution)  L1-2 T12, L1 (L2*)  Medial Branch  L2-3 L1, L2 (L3*)         "          "  L3-4 L2, L3 (L4*)         "          "  L4-5 L3, L4 (L5*)         "          "  L5-S1 L4, L5, S1          "          "    Vitals:   04/04/23 1407 04/04/23 1413 04/04/23 1419 04/04/23 1424  BP: (!)  143/96 (!) 135/91 (!) 155/108 (!) 142/84  Pulse:      Resp: 18 16 18 15   Temp:      TempSrc:      SpO2: 98% 98% 100% 100%  Weight:      Height:         End Time: 1423 hrs.  Imaging Guidance (Spinal):          Type of Imaging Technique: Fluoroscopy Guidance (Spinal) Indication(s): Assistance in needle guidance and placement for procedures requiring needle placement in or near specific anatomical locations not easily accessible without such assistance. Exposure Time: Please see nurses notes. Contrast: None used. Fluoroscopic Guidance: I was personally present during the use of fluoroscopy. "Tunnel Vision Technique" used to obtain the best possible view of the target area. Parallax error corrected before commencing the procedure. "Direction-depth-direction" technique used to introduce the needle under continuous pulsed fluoroscopy. Once target was reached, antero-posterior, oblique, and lateral fluoroscopic projection used confirm needle placement in all planes. Images permanently stored in EMR. Interpretation: No contrast injected. I personally interpreted the imaging intraoperatively. Adequate needle placement confirmed in multiple planes. Permanent images saved into the patient's record.  Post-operative Assessment:  Post-procedure Vital Signs:  Pulse/HCG Rate: 9781 Temp: 98.2 F (36.8 C) Resp: 15 BP: (!) 142/84 SpO2: 100 %  EBL: None  Complications: No immediate post-treatment complications observed by team, or reported by patient.  Note: The patient tolerated the entire procedure well. A repeat set of vitals were taken after the procedure and the patient was kept under observation following institutional policy, for this type of procedure. Post-procedural neurological assessment was performed, showing return to baseline, prior to discharge. The patient was provided with post-procedure discharge instructions, including a section on how to identify potential problems. Should any  problems arise concerning this procedure, the patient was given instructions to immediately contact us, at any time, without hesitation. In any case, we plan to contact the patient by telephone for a follow-up status report regarding this interventional procedure.  Comments:  No additional relevant information.  Plan of Care (POC)  Orders:  Orders Placed This Encounter  Procedures   DG PAIN CLINIC C-ARM 1-60 MIN NO REPORT    Intraoperative interpretation by procedural physician at [facility].    Standing Status:   Standing    Number of Occurrences:   1    Order Specific Question:   Reason for exam:    Answer:   Assistance in needle guidance and placement for procedures requiring needle placement in or near specific anatomical locations not easily accessible without such assistance.    Medications ordered for procedure: Meds ordered this encounter  Medications   lidocaine (XYLOCAINE) 2 % (with pres) injection 400 mg   dexamethasone (DECADRON) injection 10 mg   dexamethasone (DECADRON) injection 10 mg   ropivacaine (PF) 2 mg/mL (0.2%) (NAROPIN) injection 9 mL   ropivacaine (PF) 2 mg/mL (0.2%) (NAROPIN) injection 9 mL   Medications administered: We administered lidocaine, dexamethasone, dexamethasone, ropivacaine (PF) 2 mg/mL (0.2%), and ropivacaine (PF) 2 mg/mL (0.2%).  See the medical record for exact dosing, route, and time of administration.  Follow-up plan:   Return in about 15 days (around 04/19/2023) for Post Procedure Evaluation, in person.  Bilateral L3, L4, L5 lumbar facet medial branch nerve block 04/04/2023    Recent Visits Date Type Provider Dept  02/27/23 Office Visit Edward Jolly, MD [facility]  Showing recent visits within past 90 days and meeting all other requirements Today's Visits Date Type Provider Dept  04/04/23 Procedure visit Edward Jolly, MD [facility]  Showing today's visits and meeting all other  requirements Future Appointments Date Type Provider Dept  04/25/23 Appointment Edward Jolly, MD [facility]  Showing future appointments within next 90 days and meeting all other requirements  Disposition: Discharge home  Discharge (Date  Time): 04/04/2023; 1435 hrs.   Primary Care Physician: Jerrol Banana, MD Location: [facility] Note by: Edward Jolly, MD (TTS technology used. I apologize for any typographical errors that were not detected and corrected.) Date: 04/04/2023; Time: 3:21 PM  Disclaimer:  Medicine is not an Visual merchandiser. The only guarantee in medicine is that nothing is guaranteed. It is important to note that the decision to proceed with this intervention was based on the information collected from the patient. The Data and conclusions were drawn from the patient's questionnaire, the interview, and the physical examination. Because the information was provided in large part by the patient, it cannot be guaranteed that it has not been purposely or unconsciously manipulated. Every effort has been made to obtain as much relevant data as possible for this evaluation. It is important to note that the conclusions that lead to this procedure are derived in large part from the available data. Always take into account that the treatment will also be dependent on availability of resources and existing treatment guidelines, considered by other Pain Management Practitioners as being common knowledge and practice, at the time of the intervention. For Medico-Legal purposes, it is also important to point out that variation in procedural techniques and pharmacological choices are the acceptable norm. The indications, contraindications, technique, and results of the above procedure should only be interpreted and judged by a Board-Certified Interventional Pain Specialist with extensive familiarity and expertise in the same exact procedure and technique.

## 2023-04-04 NOTE — Patient Instructions (Signed)
Pain Management Discharge Instructions  General Discharge Instructions :  If you need to reach your doctor call: Monday-Friday 8:00 am - 4:00 pm at [phone] or toll free [phone].  After clinic hours [phone] to have operator reach doctor.  Bring all of your medication bottles to all your appointments in the pain clinic.  To cancel or reschedule your appointment with Pain Management please remember to call 24 hours in advance to avoid a fee.  Refer to the educational materials which you have been given on: General Risks, I had my Procedure. Discharge Instructions, Post Sedation.  Post Procedure Instructions:  The drugs you were given will stay in your system until tomorrow, so for the next 24 hours you should not drive, make any legal decisions or drink any alcoholic beverages.  You may eat anything you prefer, but it is better to start with liquids then soups and crackers, and gradually work up to solid foods.  Please notify your doctor immediately if you have any unusual bleeding, trouble breathing or pain that is not related to your normal pain.  Depending on the type of procedure that was done, some parts of your body may feel week and/or numb.  This usually clears up by tonight or the next day.  Walk with the use of an assistive device or accompanied by an adult for the 24 hours.  You may use ice on the affected area for the first 24 hours.  Put ice in a Ziploc bag and cover with a towel and place against area 15 minutes on 15 minutes off.  You may switch to heat after 24 hours.Facet Blocks Patient Information  Description: The facets are joints in the spine between the vertebrae.  Like any joints in the body, facets can become irritated and painful.  Arthritis can also effect the facets.  By injecting steroids and local anesthetic in and around these joints, we can temporarily block the nerve supply to them.  Steroids act directly on irritated nerves and tissues to  reduce selling and inflammation which often leads to decreased pain.  Facet blocks may be done anywhere along the spine from the neck to the low back depending upon the location of your pain.   After numbing the skin with local anesthetic (like Novocaine), a small needle is passed onto the facet joints under x-ray guidance.  You may experience a sensation of pressure while this is being done.  The entire block usually lasts about 15-25 minutes.   Conditions which may be treated by facet blocks:  Low back/buttock pain Neck/shoulder pain Certain types of headaches  Preparation for the injection:  Do not eat any solid food or dairy products within 8 hours of your appointment. You may drink clear liquid up to 3 hours before appointment.  Clear liquids include water, black coffee, juice or soda.  No milk or cream please. You may take your regular medication, including pain medications, with a sip of water before your appointment.  Diabetics should hold regular insulin (if taken separately) and take 1/2 normal NPH dose the morning of the procedure.  Carry some sugar containing items with you to your appointment. A driver must accompany you and be prepared to drive you home after your procedure. Bring all your current medications with you. An IV may be inserted and sedation may be given at the discretion of the physician. A blood pressure cuff, EKG and other monitors will often be applied during the procedure.  Some patients may need to   have extra oxygen administered for a short period. You will be asked to provide medical information, including your allergies and medications, prior to the procedure.  We must know immediately if you are taking blood thinners (like Coumadin/Warfarin) or if you are allergic to IV iodine contrast (dye).  We must know if you could possible be pregnant.  Possible side-effects:  Bleeding from needle site Infection (rare, may require surgery) Nerve injury (rare) Numbness  & tingling (temporary) Difficulty urinating (rare, temporary) Spinal headache (a headache worse with upright posture) Light-headedness (temporary) Pain at injection site (serveral days) Decreased blood pressure (rare, temporary) Weakness in arm/leg (temporary) Pressure sensation in back/neck (temporary)   Call if you experience:  Fever/chills associated with headache or increased back/neck pain Headache worsened by an upright position New onset, weakness or numbness of an extremity below the injection site Hives or difficulty breathing (go to the emergency room) Inflammation or drainage at the injection site(s) Severe back/neck pain greater than usual New symptoms which are concerning to you  Please note:  Although the local anesthetic injected can often make your back or neck feel good for several hours after the injection, the pain will likely return. It takes 3-7 days for steroids to work.  You may not notice any pain relief for at least one week.  If effective, we will often do a series of 2-3 injections spaced 3-6 weeks apart to maximally decrease your pain.  After the initial series, you may be a candidate for a more permanent nerve block of the facets.  If you have any questions, please call [phone] [facility] 

## 2023-04-05 ENCOUNTER — Telehealth: Payer: Self-pay

## 2023-04-05 NOTE — Telephone Encounter (Signed)
Post procedure follow up.  Patient states she is doing good.  

## 2023-04-17 ENCOUNTER — Encounter: Payer: Self-pay | Admitting: Podiatry

## 2023-04-17 ENCOUNTER — Ambulatory Visit (INDEPENDENT_AMBULATORY_CARE_PROVIDER_SITE_OTHER): Payer: Managed Care, Other (non HMO) | Admitting: Podiatry

## 2023-04-17 DIAGNOSIS — B070 Plantar wart: Secondary | ICD-10-CM | POA: Diagnosis not present

## 2023-04-17 NOTE — Progress Notes (Signed)
   Chief Complaint  Patient presents with   Plantar Warts    "I think it's healed.  It looks a ton better."    Subjective: Patient presents today for follow-up evaluation of a plantar wart to the right lower extremity.  Last visit on 03/20/2023 Cantharone was applied.  She tolerated this well.  Patient states that a large portion of the lesion peeled off about a week ago.  She believes it is resolved  Past Medical History:  Diagnosis Date   ADHD    Anxiety    Bipolar 1 disorder (HCC) 2016   Borderline personality disorder (HCC)    Depression    Epistaxis, recurrent 02/13/2022   Hypertension    Past Surgical History:  Procedure Laterality Date   DILATATION & CURRETTAGE/HYSTEROSCOPY WITH RESECTOCOPE  2011   No Known Allergies    Objective: Physical Exam General: The patient is alert and oriented x3 in no acute distress.   Dermatology: The majority of the lesion has resolved.  There is only some slight peeling tissue but overall mostly resolved verruca lesion.  Negative for open lesions or macerations.   Vascular: Palpable pedal pulses bilaterally. No edema or erythema noted. Capillary refill within normal limits.   Neurological: Grossly active via light touch   Musculoskeletal Exam: No pedal deformity   Assessment: #1 plantar wart right foot   Plan of Care:  -Patient was evaluated. - Light debridement of the plantar wart lesion was performed using a chisel blade.  Salicylic acid was applied and the lesion was dressed with a dry sterile dressing. 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 Felecia Shelling, DPM    [address] [place], [place] [address]                Office [phone]  Fax [phone]

## 2023-04-23 ENCOUNTER — Encounter: Payer: Self-pay | Admitting: Student in an Organized Health Care Education/Training Program

## 2023-04-25 ENCOUNTER — Ambulatory Visit
Payer: Managed Care, Other (non HMO) | Attending: Student in an Organized Health Care Education/Training Program | Admitting: Student in an Organized Health Care Education/Training Program

## 2023-04-25 ENCOUNTER — Encounter: Payer: Self-pay | Admitting: Student in an Organized Health Care Education/Training Program

## 2023-04-25 VITALS — BP 131/79 | HR 78 | Temp 97.3°F | Resp 16 | Ht 62.0 in | Wt 271.3 lb

## 2023-04-25 DIAGNOSIS — M47816 Spondylosis without myelopathy or radiculopathy, lumbar region: Secondary | ICD-10-CM | POA: Diagnosis not present

## 2023-04-25 DIAGNOSIS — G8929 Other chronic pain: Secondary | ICD-10-CM | POA: Diagnosis not present

## 2023-04-25 DIAGNOSIS — M5136 Other intervertebral disc degeneration, lumbar region: Secondary | ICD-10-CM | POA: Insufficient documentation

## 2023-04-25 DIAGNOSIS — M545 Low back pain, unspecified: Secondary | ICD-10-CM | POA: Insufficient documentation

## 2023-04-25 NOTE — Progress Notes (Signed)
PROVIDER NOTE: Information contained herein reflects review and annotations entered in association with encounter. Interpretation of such information and data should be left to medically-trained personnel. Information provided to patient can be located elsewhere in the medical record under "Patient Instructions". Document created using STT-dictation technology, any transcriptional errors that may result from process are unintentional.    Patient: Amanda Nash  Service Category: E/M  Provider: Edward Jolly, MD  DOB: Mar 08, 1986  DOS: 04/25/2023  Referring Provider: Jerrol Banana, MD  MRN: [id number]  Specialty: Interventional Pain Management  PCP: Jerrol Banana, MD  Type: Established Patient  Setting: Ambulatory outpatient    Location: Office  Delivery: Face-to-face     HPI  Amanda Nash, a 37 y.o. year old female, is here today because of her Lumbar facet arthropathy [M47.816]. Amanda Nash primary complain today is Back Pain  Pertinent problems: Amanda Nash does not have any pertinent problems on file. Pain Assessment: Severity of Chronic pain is reported as a 0-No pain/10. Location: Back Lower/denies. Onset:  . Quality:  . Timing:  . Modifying factor(s): procedure. Vitals:  height is 5\' 2"  (1.575 m) and weight is 271 lb 4.8 oz (123.1 kg). Her temperature is 97.3 F (36.3 C) (abnormal). Her blood pressure is 131/79 and her pulse is 78. Her respiration is 16 and oxygen saturation is 99%.  BMI: Estimated body mass index is 49.62 kg/m as calculated from the following:   Height as of this encounter: 5\' 2"  (1.575 m).   Weight as of this encounter: 271 lb 4.8 oz (123.1 kg). Last encounter: 02/27/2023. Last procedure: 04/04/2023.  Reason for encounter: post-procedure evaluation and assessment.    Post-procedure evaluation   Procedure: Lumbar Facet, Medial Branch Block(s) #1  Laterality: Bilateral  Level: L3, L4, and L5 Medial Branch Level(s). Injecting these levels blocks the L3-4 and  L4-5 lumbar facet joints. Imaging: Fluoroscopic guidance         Anesthesia: Local anesthesia (1-2% Lidocaine) DOS: 04/04/2023 Performed by: Edward Jolly, MD  Primary Purpose: Diagnostic/Therapeutic Indications: Low back pain severe enough to impact quality of life or function. 1. Lumbar facet arthropathy   2. Chronic bilateral low back pain without sciatica    NAS-11 Pain score:   Pre-procedure: 4 /10   Post-procedure: 4 /10      Effectiveness:  Initial hour after procedure: 0 %  Subsequent 4-6 hours post-procedure: 0 %  Analgesia past initial 6 hours: 100 %  Ongoing improvement:  Analgesic:  100% Function: Amanda Nash reports improvement in function ROM: Amanda Nash reports improvement in ROM     ROS  Constitutional: Denies any fever or chills Gastrointestinal: No reported hemesis, hematochezia, vomiting, or acute GI distress Musculoskeletal:  improvement in low back pain Neurological: No reported episodes of acute onset apraxia, aphasia, dysarthria, agnosia, amnesia, paralysis, loss of coordination, or loss of consciousness  Medication Review  ARIPiprazole, SUMAtriptan, Vitamin D (Ergocalciferol), atomoxetine, celecoxib, gabapentin, lamoTRIgine, levonorgestrel, losartan-hydrochlorothiazide, norethindrone, nortriptyline, nystatin-triamcinolone ointment, pantoprazole, propranolol, rizatriptan, tirzepatide, topiramate, and traZODone  History Review  Allergy: Amanda Nash has No Known Allergies. Drug: Amanda Nash  reports that she does not currently use drugs. Alcohol:  reports that she does not currently use alcohol after a past usage of about 7.0 standard drinks of alcohol per week. Tobacco:  reports that she has been smoking cigarettes and e-cigarettes. She has never used smokeless tobacco. Social: Amanda Nash  reports that she has been smoking cigarettes and e-cigarettes. She has never used smokeless tobacco. She  reports that she does not currently use alcohol after a past usage of  about 7.0 standard drinks of alcohol per week. She reports that she does not currently use drugs. Medical:  has a past medical history of ADHD, Anxiety, Bipolar 1 disorder (HCC) (2016), Borderline personality disorder (HCC), Depression, Epistaxis, recurrent (02/13/2022), and Hypertension. Surgical: Amanda Nash  has a past surgical history that includes Dilatation & currettage/hysteroscopy with resectoscope (2011). Family: family history includes Anxiety disorder in her brother and mother; Bipolar disorder in her father; Colon cancer in her maternal grandfather; Depression in her brother and mother; Diabetes in her father; Heart disease in her mother; Hypertension in her father.  Laboratory Chemistry Profile   Renal Lab Results  Component Value Date   BUN 17 01/15/2023   CREATININE 0.91 01/15/2023   BCR 19 01/15/2023   GFRAA 116 05/22/2019   GFRNONAA >60 09/14/2022    Hepatic Lab Results  Component Value Date   AST 32 01/15/2023   ALT 69 (H) 01/15/2023   ALBUMIN 4.2 01/15/2023   ALKPHOS 80 01/15/2023    Electrolytes Lab Results  Component Value Date   NA 136 01/15/2023   K 4.3 01/15/2023   CL 98 01/15/2023   CALCIUM 9.3 01/15/2023    Bone Lab Results  Component Value Date   VD25OH 26.9 (L) 01/15/2023    Inflammation (CRP: Acute Phase) (ESR: Chronic Phase) No results found for: "CRP", "ESRSEDRATE", "LATICACIDVEN"       Note: Above Lab results reviewed.  Recent Imaging Review  US PELVIS TRANSVAGINAL NON-OB (TV ONLY) ULTRASOUND REPORT   Location: [facility] Date of Service: 03/29/2023        Indications:Abnormal Uterine Bleeding Findings:  The uterus is anteverted and measures 8.43 x 5.57 x 4.50 cm. Echo texture is homogenous without evidence of focal masses.   The Endometrium measures 3.85 mm. IUD appears within endo - slightly  shifted down 0.53 cm from fundus   Right Ovary measures 3.12 x 2.56 x 2.54  cm. It is normal in appearance.  Simple rt  ovarian cyst seen measuring; 1.84 x 2.04 x 2.09 cm Left Ovary measures 3.03 x 2.09 x 1.94 cm. It is normal in appearance. Survey of the adnexa demonstrates no adnexal masses. There is no free fluid in the cul de sac.   Impression: 1. IUD slightly shifted down in endometrium  (1/2 cm) 2. Small rt ovarian cyst   Recommendations: 1.Clinical correlation with the patient's History and Physical Exam.   Waldo Laine, RT  The ultrasound images and findings were reviewed by me and I agree with  the above report.  Elonda Husky, M.D. 04/08/2023 9:56 PM Note: Reviewed        Physical Exam  General appearance: Well nourished, well developed, and well hydrated. In no apparent acute distress Mental status: Alert, oriented x 3 (person, Nash, & time)       Respiratory: No evidence of acute respiratory distress Eyes: PERLA Vitals: BP 131/79   Pulse 78   Temp (!) 97.3 F (36.3 C)   Resp 16   Ht 5\' 2"  (1.575 m)   Wt 271 lb 4.8 oz (123.1 kg)   LMP 03/26/2023   SpO2 99%   BMI 49.62 kg/m  BMI: Estimated body mass index is 49.62 kg/m as calculated from the following:   Height as of this encounter: 5\' 2"  (1.575 m).   Weight as of this encounter: 271 lb 4.8 oz (123.1 kg). Ideal: Ideal body weight:  50.1 kg (110 lb 7.2 oz) Adjusted ideal body weight: 79.3 kg (174 lb 12.6 oz)  Assessment   Diagnosis Status  1. Lumbar facet arthropathy   2. Chronic bilateral low back pain without sciatica   3. Lumbar degenerative disc disease    Controlled Controlled Controlled    Plan of Care    Orders:  Orders Placed This Encounter  Procedures   LUMBAR FACET(MEDIAL BRANCH NERVE BLOCK) MBNB    Standing Status:   Standing    Number of Occurrences:   2    Standing Expiration Date:   10/26/2023    Scheduling Instructions:     Procedure: Lumbar facet block (AKA.: Lumbosacral medial branch nerve block)     Side: Bilateral     Level: L3-4, L4-5, Facets ( L3, L4, L5, Medial Branch)     Sedation:  Patient's choice.     Timeframe: PRN    Order Specific Question:   Where will this procedure be performed?    Answer:   [facility]   Follow-up plan:   Return for PRN- lumbar facets.      Bilateral L3, L4, L5 lumbar facet medial branch nerve block 04/04/2023: 100 % pain relief, repeat #2 prn     Recent Visits Date Type Provider Dept  04/04/23 Procedure visit Edward Jolly, MD [facility]  02/27/23 Office Visit Edward Jolly, MD [facility]  Showing recent visits within past 90 days and meeting all other requirements Today's Visits Date Type Provider Dept  04/25/23 Office Visit Edward Jolly, MD [facility]  Showing today's visits and meeting all other requirements Future Appointments No visits were found meeting these conditions. Showing future appointments within next 90 days and meeting all other requirements  I discussed the assessment and treatment plan with the patient. The patient was provided an opportunity to ask questions and all were answered. The patient agreed with the plan and demonstrated an understanding of the instructions.  Patient advised to call back or seek an in-person evaluation if the symptoms or condition worsens.  Duration of encounter: .  Total time on encounter, as per AMA guidelines included both the face-to-face and non-face-to-face time personally spent by the physician and/or other qualified health care professional(s) on the day of the encounter (includes time in activities that require the physician or other qualified health care professional and does not include time in activities normally performed by clinical staff). Physician's time may include the following activities when performed: Preparing to see the patient (e.g., pre-charting review of records, searching for previously ordered imaging, lab work, and nerve conduction tests) Review of prior analgesic pharmacotherapies. Reviewing  PMP Interpreting ordered tests (e.g., lab work, imaging, nerve conduction tests) Performing post-procedure evaluations, including interpretation of diagnostic procedures Obtaining and/or reviewing separately obtained history Performing a medically appropriate examination and/or evaluation Counseling and educating the patient/family/caregiver Ordering medications, tests, or procedures Referring and communicating with other health care professionals (when not separately reported) Documenting clinical information in the electronic or other health record Independently interpreting results (not separately reported) and communicating results to the patient/ family/caregiver Care coordination (not separately reported)  Note by: Edward Jolly, MD Date: 04/25/2023; Time: 1:55 PM

## 2023-05-02 ENCOUNTER — Encounter: Payer: Self-pay | Admitting: Obstetrics and Gynecology

## 2023-05-02 NOTE — Telephone Encounter (Signed)
Please advise 

## 2023-05-09 ENCOUNTER — Encounter: Payer: Self-pay | Admitting: Obstetrics and Gynecology

## 2023-05-09 ENCOUNTER — Ambulatory Visit (INDEPENDENT_AMBULATORY_CARE_PROVIDER_SITE_OTHER): Payer: Managed Care, Other (non HMO) | Admitting: Obstetrics and Gynecology

## 2023-05-09 VITALS — BP 123/78 | HR 74 | Resp 16 | Ht 62.0 in | Wt 270.3 lb

## 2023-05-09 DIAGNOSIS — R102 Pelvic and perineal pain: Secondary | ICD-10-CM | POA: Diagnosis not present

## 2023-05-09 DIAGNOSIS — N941 Unspecified dyspareunia: Secondary | ICD-10-CM | POA: Diagnosis not present

## 2023-05-09 DIAGNOSIS — N921 Excessive and frequent menstruation with irregular cycle: Secondary | ICD-10-CM | POA: Diagnosis not present

## 2023-05-09 NOTE — Patient Instructions (Signed)
 Endometrial Biopsy  An endometrial biopsy is a procedure where a tissue sample is removed from the lining of the uterus. This lining is called the endometrium. The tissue sample is then sent to a lab for testing. You may have this type of biopsy to check for: Cancer. Infection. Growths called polyps. Uterine bleeding that can't be explained. Tell a health care provider about: Any allergies you have. All medicines you're taking including vitamins, herbs, eye drops, creams, and over-the-counter medicines. Any problems you or family members have had with anesthesia. Any bleeding problems you have. Any surgeries you have had. Any medical problems you have. Whether you're pregnant or may be pregnant. What are the risks? Your health care provider will talk with you about risks. These may include: Bleeding. Infection. Allergic reactions to medicines. Damage to the wall of the uterus. This is rare. What happens before the procedure? Keep track of your period. You may need to have this biopsy when you're not having your period. Ask your provider about: Changing or stopping your regular medicines. These include any diabetes medicines or blood thinners you take. Taking medicines such as aspirin and ibuprofen. These medicines can thin your blood. Do not take them unless your provider tells you to. Taking over-the-counter medicines, vitamins, herbs, and supplements. Bring a pad with you. You may need to wear one after the biopsy. Plan to have a responsible adult take you home from the hospital or clinic. You won't be allowed to drive. What happens during the procedure? A tool will be put into your vagina to hold it open. This helps your provider see the cervix. The cervix is the lowest part of the uterus. Your cervix will be cleaned with a solution that kills germs. You will be given anesthesia. This keeps you from feeling pain. It will numb your cervix. A tool called forceps will be used to  hold your cervix steady. A thin tool called a uterine sound will be put through your cervix. It will be used to: Find the length of your uterus. Find where to take the sample from. A soft tube called a catheter will be put into your uterus. The catheter will remove a tissue sample. The tube and tools will be removed. The sample will be sent to a lab for testing. The procedure may vary among providers and hospitals. What happens after the procedure? Your blood pressure, heart rate, breathing rate, and blood oxygen level will be monitored until you leave the hospital or clinic. It's up to you to get the results of your procedure. Ask your provider, or the department that is doing the procedure, when your results will be ready. This information is not intended to replace advice given to you by your health care provider. Make sure you discuss any questions you have with your health care provider. Document Revised: 11/07/2022 Document Reviewed: 11/07/2022 Elsevier Patient Education  2024 [employer].

## 2023-05-09 NOTE — Progress Notes (Unsigned)
GYNECOLOGY PROGRESS NOTE  Subjective:    Patient ID: Amanda Nash, female    DOB: [birth year], 37 y.o.   MRN: [id number]  HPI  Patient is a 37 y.o. [id number] female who presents for consultation for hysterectomy. She was referred from [employer], PA-C. She has had a Mirena IUD since 12/2018. She has been having dyspareunia with IUD since it was placed and it is slowly getting worse.  Has also been experiencing issues with breakthrough bleeding and dysmenorrhea for the past 4 to 5 months.  Today she reports that she has been bleeding this month for the entire month.  Currently does not take anything for the pain as she is seen by the pain clinic for history of chronic pain.  Currently receiving nerve blocks.  Was told that she cannot take OTC pain relievers.  Reports that she had considered having a hysterectomy several years ago, however opted to try the IUD first.  Now notes that she is certain that she would like to proceed with an IUD.   Menstrual History: OB History     Gravida  1   Para      Term      Preterm      AB  1   Living         SAB  1   IAB  0   Ectopic      Multiple      Live Births              Menarche age: 60 No LMP recorded (lmp unknown). (Menstrual status: IUD). Last pap smear: 02/27/2023. Results were: normal.    The following portions of the patient's history were reviewed and updated as appropriate:  She  has a past medical history of ADHD, Anxiety, Bipolar 1 disorder (HCC) (2016), Borderline personality disorder (HCC), Depression, Epistaxis, recurrent (02/13/2022), and Hypertension.  She  has a past surgical history that includes Dilatation & currettage/hysteroscopy with resectoscope (2011).  Her family history includes Anxiety disorder in her brother and mother; Bipolar disorder in her father; Colon cancer in her maternal grandfather; Depression in her brother and mother; Diabetes in her father; Heart disease in her mother;  Hypertension in her father.  She  reports that she has been smoking cigarettes and e-cigarettes. She has never used smokeless tobacco. She reports that she does not currently use alcohol after a past usage of about 7.0 standard drinks of alcohol per week. She reports that she does not currently use drugs.  Current Outpatient Medications on File Prior to Visit  Medication Sig Dispense Refill   ARIPiprazole (ABILIFY) 10 MG tablet Take by mouth.     atomoxetine (STRATTERA) 80 MG capsule Take by mouth.     celecoxib (CELEBREX) 100 MG capsule Take 1 capsule (100 mg total) by mouth 2 (two) times daily as needed. 60 capsule 1   gabapentin (NEURONTIN) 400 MG capsule Take 400 mg by mouth 2 (two) times daily.     lamoTRIgine (LAMICTAL) 100 MG tablet Take by mouth.     levonorgestrel (MIRENA) 20 MCG/DAY IUD by Intrauterine route.     losartan-hydrochlorothiazide (HYZAAR) 50-12.5 MG tablet Take 1 tablet by mouth daily.     norethindrone (AYGESTIN) 5 MG tablet Take 1 tablet (5 mg total) by mouth daily. 90 tablet 0   nortriptyline (PAMELOR) 10 MG capsule Take nortriptyline 30 mg nightly for 1 week, then if symptoms fail to improve increase to 40 mg nightly and continue this dose.  nystatin-triamcinolone ointment (MYCOLOG) Apply 1 Application topically 2 (two) times daily. 30 g 0   pantoprazole (PROTONIX) 40 MG tablet Take 1 tablet by mouth once daily 30 tablet 0   propranolol (INDERAL) 40 MG tablet Take 40 mg by mouth 2 (two) times daily.     rizatriptan (MAXALT-MLT) 5 MG disintegrating tablet Take by mouth.     SUMAtriptan (IMITREX) 50 MG tablet Take by mouth.     tirzepatide (MOUNJARO) 5 MG/0.5ML Pen Inject 5 mg into the skin once a week. Start after 1 month of the 2.5 mg dose. 6 mL 1   topiramate (TOPAMAX) 25 MG tablet Take 25 mg by mouth 2 (two) times daily.     traZODone (DESYREL) 150 MG tablet Take 150 mg by mouth at bedtime.     Vitamin D, Ergocalciferol, (DRISDOL) 1.25 MG (50000 UNIT) CAPS  capsule Take 1 capsule (50,000 Units total) by mouth every 7 (seven) days. Take for 8 total doses(weeks) 8 capsule 0   No current facility-administered medications on file prior to visit.   She has No Known Allergies..  Review of Systems Pertinent items noted in HPI and remainder of comprehensive ROS otherwise negative.   Objective:   Blood pressure 123/78, pulse 74, resp. rate 16, height 5\' 2"  (1.575 m), weight 270 lb 4.8 oz (122.6 kg). Body mass index is 49.44 kg/m. General appearance: alert, cooperative, and no distress Remainder of exam deferred.   Imaging:  US PELVIS TRANSVAGINAL NON-OB (TV ONLY) ULTRASOUND REPORT   Location: [facility] Date of Service: 03/29/2023        Indications:Abnormal Uterine Bleeding Findings:  The uterus is anteverted and measures 8.43 x 5.57 x 4.50 cm. Echo texture is homogenous without evidence of focal masses.   The Endometrium measures 3.85 mm. IUD appears within endo - slightly  shifted down 0.53 cm from fundus   Right Ovary measures 3.12 x 2.56 x 2.54  cm. It is normal in appearance.  Simple rt ovarian cyst seen measuring; 1.84 x 2.04 x 2.09 cm Left Ovary measures 3.03 x 2.09 x 1.94 cm. It is normal in appearance. Survey of the adnexa demonstrates no adnexal masses. There is no free fluid in the cul de sac.   Impression: 1. IUD slightly shifted down in endometrium  (1/2 cm) 2. Small rt ovarian cyst   Recommendations: 1.Clinical correlation with the patient's History and Physical Exam.   Waldo Laine, RT  The ultrasound images and findings were reviewed by me and I agree with  the above report.  Elonda Husky, M.D. 04/08/2023 9:56 PM   Assessment:   1. Pelvic pain   2. Breakthrough bleeding with IUD   3. Dyspareunia, female   4. Obesity, Class III, BMI 40-49.9 (morbid obesity) (HCC)      Plan:   -Patient with dysfunctional bleeding with her IUD, pelvic pain, and dyspareunia.  Last ultrasound  notes IUD overall in the right place (although slightly shifted downward from the fundus).  I discussed all of her options including removal of current IUD and replacement as some patients do experience breakthrough bleeding around the 4 to 5-year mark despite new FDA approval allowing for maintenance of the IUD up to 8 years.  I also discussed use of supplemental progesterone in addition to the IUD for short time, surgical intervention including endometrial ablation or hysterectomy.  After discussion of all options, patient notes that she would like to proceed with hysterectomy.  I discussed need for final workup  with endometrial biopsy.  Patient notes understanding.  Will return in 2 to 3 weeks for endometrial biopsy and preoperative exam.  Also discussed use of the robotic system due to patient's elevated BMI.  Given handouts on hysterectomy and robotic system.  Also included information management for biopsy in her AVS.   A total of 25 minutes were spent during this encounter, including review of previous progress notes, recent imaging and labs, face-to-face with time with patient involving counseling and coordination of care, as well as documentation for current visit.   Hildred Laser, MD [facility]

## 2023-05-22 NOTE — Progress Notes (Unsigned)
GYNECOLOGY PROGRESS NOTE  Subjective:    Patient ID: Amanda Nash, female    DOB: [birth year], 37 y.o.   MRN: [id number]  HPI  Patient is a 37 y.o. G40P0010 female who presents for endometrial biopsy and preoperative exam.  Amanda Nash has been dealing with pelvic pain, dyspareunia, for the past several years, as well as breakthrough bleeding for the past 5 months. Currently has an IUD in place, however had issues with abnormal bleeding prior to placement as well. Has decided on definitive management with hysterectomy.   The following portions of the patient's history were reviewed and updated as appropriate: allergies, current medications, past family history, past medical history, past social history, past surgical history, and problem list.  Review of Systems Pertinent items noted in HPI and remainder of comprehensive ROS otherwise negative.   Objective:   Blood pressure (!) 123/91, pulse 70, resp. rate 16, height 5\' 2"  (1.575 m), weight 270 lb 14.4 oz (122.9 kg). Body mass index is 49.55 kg/m. General appearance: alert and no distress Abdomen: soft, non-tender; bowel sounds normal; no masses,  no organomegaly Pelvic: external genitalia normal, rectovaginal septum normal.  Vagina without discharge.  Cervix normal appearing, no lesions and no motion tenderness.  Uterus difficult to palpate due to body habitus.  Adnexae non-palpable, nontender bilaterally.  See H&P for remainder of exam.    Labs:  Lab Results  Component Value Date   WBC 7.5 01/15/2023   HGB 13.9 01/15/2023   HCT 42.5 01/15/2023   MCV 82 01/15/2023   PLT 358 01/15/2023     Imaging:  US PELVIS TRANSVAGINAL NON-OB (TV ONLY) ULTRASOUND REPORT   Location: [facility] at [facility] Date of Service: 03/29/2023        Indications:Abnormal Uterine Bleeding Findings:  The uterus is anteverted and measures 8.43 x 5.57 x 4.50 cm. Echo texture is homogenous without evidence of focal masses.   The Endometrium  measures 3.85 mm. IUD appears within endo - slightly  shifted down 0.53 cm from fundus   Right Ovary measures 3.12 x 2.56 x 2.54  cm. It is normal in appearance.  Simple rt ovarian cyst seen measuring; 1.84 x 2.04 x 2.09 cm Left Ovary measures 3.03 x 2.09 x 1.94 cm. It is normal in appearance. Survey of the adnexa demonstrates no adnexal masses. There is no free fluid in the cul de sac.   Impression: 1. IUD slightly shifted down in endometrium  (1/2 cm) 2. Small rt ovarian cyst   Recommendations: 1.Clinical correlation with the patient's History and Physical Exam.   Waldo Laine, RT  The ultrasound images and findings were reviewed by me and I agree with  the above report.  Elonda Husky, M.D. 04/08/2023 9:56 PM    Assessment:   1. Pelvic pain   2. Breakthrough bleeding with IUD   3. Dyspareunia, female   4. History of dysfunctional uterine bleeding      Plan:   Patient desires definitive management with hysterectomy.  I proposed doing a robotic-assisted total laparoscopic prophylactic bilateral salpingectomy, IUD removal.  No  indication for oophorectomy.  Patient agrees with this proposed surgery.  The risks of surgery were discussed in detail with the patient including but not limited to: bleeding which may require transfusion or reoperation; infection which may require antibiotics; injury to bowel, bladder, ureters or other surrounding organs; need for additional procedures including laparotomy or subsequent procedures secondary to abnormal pathology; formation of adhesions; thromboembolic phenomenon; incisional problems and  other postoperative/anesthesia complications.  Patient was also advised that she will be discharged home after surgery; and expected recovery time after a hysterectomy is 6-8 weeks.  Patient was told that the likelihood that her condition and symptoms will be treated effectively with this surgical management was very high; the postoperative  expectations were also discussed in detail. The patient also understands the alternative treatment options which were discussed in full. All questions were answered.  She was told that she will be contacted by our surgical scheduler regarding the time and date of her surgery; routine preoperative instructions will be given to her by the preoperative nursing team.  Routine postoperative instructions will be reviewed with the patient in detail after surgery.  In the meantime, she will continue use of her IUD until her surgery; bleeding precautions were reviewed. Surgery scheduled for 06/11/2023. Will remove IUD at time of surgery.   - Endometrial biopsy performed as part of workup today, see procedure note below. Given post-procedure instructions.     Endometrial Biopsy Procedure Note  The patient is positioned on the exam table in the dorsal lithotomy position. Bimanual exam confirms uterine position and size. A Graves speculum is placed into the vagina. A single toothed tenaculum is placed onto the anterior lip of the cervix. IUD threads visualized, ~ 3 cm. The pipette is placed into the endocervical canal and is advanced to the uterine fundus. Using a piston like technique, with vacuum created by withdrawing the stylus, the endometrial specimen is obtained and transferred to the biopsy container. Minimal bleeding is encountered. The procedure is well tolerated.   Uterine Position: mid anterior    Uterine Length: 8 cm   Uterine Specimen: Scant   Post procedure instructions are given. The patient is scheduled for follow up appointment.   Hildred Laser, MD [facility] at [facility]

## 2023-05-23 ENCOUNTER — Ambulatory Visit (INDEPENDENT_AMBULATORY_CARE_PROVIDER_SITE_OTHER): Payer: Managed Care, Other (non HMO) | Admitting: Obstetrics and Gynecology

## 2023-05-23 ENCOUNTER — Other Ambulatory Visit (HOSPITAL_COMMUNITY)
Admission: RE | Admit: 2023-05-23 | Discharge: 2023-05-23 | Disposition: A | Discharge status: Home / Self Care | Payer: Managed Care, Other (non HMO) | Source: Ambulatory Visit | Attending: Obstetrics and Gynecology | Admitting: Obstetrics and Gynecology

## 2023-05-23 ENCOUNTER — Encounter: Payer: Self-pay | Admitting: Obstetrics and Gynecology

## 2023-05-23 VITALS — BP 123/91 | HR 70 | Resp 16 | Ht 62.0 in | Wt 270.9 lb

## 2023-05-23 DIAGNOSIS — R102 Pelvic and perineal pain: Secondary | ICD-10-CM

## 2023-05-23 DIAGNOSIS — Z8742 Personal history of other diseases of the female genital tract: Secondary | ICD-10-CM

## 2023-05-23 DIAGNOSIS — N941 Unspecified dyspareunia: Secondary | ICD-10-CM | POA: Diagnosis present

## 2023-05-23 DIAGNOSIS — Z01818 Encounter for other preprocedural examination: Secondary | ICD-10-CM | POA: Diagnosis not present

## 2023-05-23 DIAGNOSIS — N921 Excessive and frequent menstruation with irregular cycle: Secondary | ICD-10-CM | POA: Diagnosis present

## 2023-05-23 DIAGNOSIS — N898 Other specified noninflammatory disorders of vagina: Secondary | ICD-10-CM | POA: Diagnosis not present

## 2023-05-23 DIAGNOSIS — Z975 Presence of (intrauterine) contraceptive device: Secondary | ICD-10-CM | POA: Insufficient documentation

## 2023-05-23 NOTE — Addendum Note (Signed)
Addended by: Fabian November on: 05/23/2023 09:49 PM   Modules accepted: Level of Service

## 2023-05-23 NOTE — H&P (Signed)
GYNECOLOGY PREOPERATIVE HISTORY AND PHYSICAL   Subjective:  Amanda Nash is a 37 y.o. G1P0010 here for surgical management of pelvic pain, dyspareunia, history of dysfunctional bleeding, and breakthrough bleeding with IUD.  No significant preoperative concerns.  Proposed surgery: IUD removal, Robotic-assisted total laparoscopic hysterectomy with bilateral salpingectomy    Pertinent Gynecological History: Menses:  irregular Contraception: IUD Last pap: normal Date: 02/27/2023. Results: normal    Past Medical History:  Diagnosis Date   ADHD    Anxiety    Bipolar 1 disorder (HCC) 2016   Borderline personality disorder (HCC)    Depression    Epistaxis, recurrent 02/13/2022   Hypertension     Past Surgical History:  Procedure Laterality Date   DILATATION & CURRETTAGE/HYSTEROSCOPY WITH RESECTOCOPE  2011    OB History  Gravida Para Term Preterm AB Living  1       1    SAB IAB Ectopic Multiple Live Births  1 0          # Outcome Date GA Lbr Len/2nd Weight Sex Type Anes PTL Lv  1 SAB 2010            Family History  Problem Relation Age of Onset   Depression Mother    Anxiety disorder Mother    Heart disease Mother    Bipolar disorder Father    Hypertension Father    Diabetes Father    Depression Brother    Anxiety disorder Brother    Colon cancer Maternal Grandfather     Social History   Socioeconomic History   Marital status: Married    Spouse name: Argelia Thurow   Number of children: 0   Years of education: 14   Highest education level: Associate degree: academic program  Occupational History   Occupation: Public house manager    Comment: [employer]  Tobacco Use   Smoking status: Every Day    Types: Cigarettes, E-cigarettes   Smokeless tobacco: Never   Tobacco comments:    Vape daily - currently 01/30/2023, no cigarettes  Vaping Use   Vaping status: Every Day  Substance and Sexual Activity   Alcohol use: Not Currently    Alcohol/week: 7.0  standard drinks of alcohol    Types: 5 Cans of beer, 2 Shots of liquor per week   Drug use: Not Currently   Sexual activity: Yes    Partners: Male    Birth control/protection: I.U.D.    Comment: Mirena  Other Topics Concern   Not on file  Social History Narrative   Live with husband and her brother, has 2 dogs and a bunny at home. Has stepdaughter that does not live with them.   Social Determinants of Health   Financial Resource Strain: Low Risk  (12/02/2018)   Overall Financial Resource Strain (CARDIA)    Difficulty of Paying Living Expenses: Not hard at all  Food Insecurity: No Food Insecurity (12/02/2018)   Hunger Vital Sign    Worried About Running Out of Food in the Last Year: Never true    Ran Out of Food in the Last Year: Never true  Transportation Needs: No Transportation Needs (12/02/2018)   PRAPARE - Administrator, Civil Service (Medical): No    Lack of Transportation (Non-Medical): No  Physical Activity: Inactive (12/02/2018)   Exercise Vital Sign    Days of Exercise per Week: 0 days    Minutes of Exercise per Session: 0 min  Stress: Stress Concern Present (12/02/2018)   [place]  Institute of Occupational Health - Occupational Stress Questionnaire    Feeling of Stress : Rather much  Social Connections: Moderately Integrated (12/02/2018)   Social Connection and Isolation Panel [NHANES]    Frequency of Communication with Friends and Family: More than three times a week    Frequency of Social Gatherings with Friends and Family: More than three times a week    Attends Religious Services: More than 4 times per year    Active Member of [employer] or Organizations: No    Attends Banker Meetings: Never    Marital Status: Married  Catering manager Violence: Not At Risk (12/02/2018)   Humiliation, Afraid, Rape, and Kick questionnaire    Fear of Current or Ex-Partner: No    Emotionally Abused: No    Physically Abused: No    Sexually Abused: No    Current  Outpatient Medications on File Prior to Visit  Medication Sig Dispense Refill   ARIPiprazole (ABILIFY) 10 MG tablet Take by mouth.     atomoxetine (STRATTERA) 80 MG capsule Take by mouth.     celecoxib (CELEBREX) 100 MG capsule Take 1 capsule (100 mg total) by mouth 2 (two) times daily as needed. 60 capsule 1   gabapentin (NEURONTIN) 400 MG capsule Take 400 mg by mouth 2 (two) times daily.     lamoTRIgine (LAMICTAL) 100 MG tablet Take by mouth.     levonorgestrel (MIRENA) 20 MCG/DAY IUD by Intrauterine route.     losartan-hydrochlorothiazide (HYZAAR) 50-12.5 MG tablet Take 1 tablet by mouth daily.     norethindrone (AYGESTIN) 5 MG tablet Take 1 tablet (5 mg total) by mouth daily. 90 tablet 0   nortriptyline (PAMELOR) 10 MG capsule Take nortriptyline 30 mg nightly for 1 week, then if symptoms fail to improve increase to 40 mg nightly and continue this dose.     nystatin-triamcinolone ointment (MYCOLOG) Apply 1 Application topically 2 (two) times daily. 30 g 0   pantoprazole (PROTONIX) 40 MG tablet Take 1 tablet by mouth once daily 30 tablet 0   propranolol (INDERAL) 40 MG tablet Take 40 mg by mouth 2 (two) times daily.     rizatriptan (MAXALT-MLT) 5 MG disintegrating tablet Take by mouth.     SUMAtriptan (IMITREX) 50 MG tablet Take by mouth.     tirzepatide (MOUNJARO) 5 MG/0.5ML Pen Inject 5 mg into the skin once a week. Start after 1 month of the 2.5 mg dose. 6 mL 1   topiramate (TOPAMAX) 25 MG tablet Take 25 mg by mouth 2 (two) times daily.     traZODone (DESYREL) 150 MG tablet Take 150 mg by mouth at bedtime.     Vitamin D, Ergocalciferol, (DRISDOL) 1.25 MG (50000 UNIT) CAPS capsule Take 1 capsule (50,000 Units total) by mouth every 7 (seven) days. Take for 8 total doses(weeks) 8 capsule 0   No current facility-administered medications on file prior to visit.   No Known Allergies    Review of Systems Constitutional: No recent fever/chills/sweats Respiratory: No recent  cough/bronchitis Cardiovascular: No chest pain Gastrointestinal: No recent nausea/vomiting/diarrhea Genitourinary: No UTI symptoms Hematologic/lymphatic:No history of coagulopathy or recent blood thinner use    Objective:   Blood pressure (!) 123/91, pulse 70, resp. rate 16, height 5\' 2"  (1.575 m), weight 270 lb 14.4 oz (122.9 kg). CONSTITUTIONAL: Well-developed, well-nourished female in no acute distress.  HENT:  Normocephalic, atraumatic, External right and left ear normal. Oropharynx is clear and moist EYES: Conjunctivae and EOM are normal. Pupils are equal, round,  and reactive to light. No scleral icterus.  NECK: Normal range of motion, supple, no masses SKIN: Skin is warm and dry. No rash noted. Not diaphoretic. No erythema. No pallor. NEUROLOGIC: Alert and oriented to person, place, and time. Normal reflexes, muscle tone coordination. No cranial nerve deficit noted. PSYCHIATRIC: Normal mood and affect. Normal behavior. Normal judgment and thought content. CARDIOVASCULAR: Normal heart rate noted, regular rhythm RESPIRATORY: Effort and breath sounds normal, no problems with respiration noted ABDOMEN: Soft, nontender, nondistended. PELVIC: External genitalia normal, rectovaginal septum normal.  Vagina without discharge.  Cervix normal appearing, no lesions and no motion tenderness.  Uterus , normal shape and size.  Adnexae non-palpable, nontender bilaterally.   MUSCULOSKELETAL: Normal range of motion. No edema and no tenderness. 2+ distal pulses.    Labs: Lab Results  Component Value Date   WBC 7.5 01/15/2023   HGB 13.9 01/15/2023   HCT 42.5 01/15/2023   MCV 82 01/15/2023   PLT 358 01/15/2023     Imaging Studies: US PELVIS TRANSVAGINAL NON-OB (TV ONLY) ULTRASOUND REPORT   Location: [place] [facility] at [facility] Date of Service: 03/29/2023        Indications:Abnormal Uterine Bleeding Findings:  The uterus is anteverted and measures 8.43 x 5.57 x 4.50 cm. Echo texture  is homogenous without evidence of focal masses.   The Endometrium measures 3.85 mm. IUD appears within endo - slightly  shifted down 0.53 cm from fundus   Right Ovary measures 3.12 x 2.56 x 2.54  cm. It is normal in appearance.  Simple rt ovarian cyst seen measuring; 1.84 x 2.04 x 2.09 cm Left Ovary measures 3.03 x 2.09 x 1.94 cm. It is normal in appearance. Survey of the adnexa demonstrates no adnexal masses. There is no free fluid in the cul de sac.   Impression: 1. IUD slightly shifted down in endometrium  (1/2 cm) 2. Small rt ovarian cyst   Recommendations: 1.Clinical correlation with the patient's History and Physical Exam.   Waldo Laine, RT  The ultrasound images and findings were reviewed by me and I agree with  the above report.  Elonda Husky, M.D. 04/08/2023 9:56 PM    Assessment:    1. Pelvic pain   2. Breakthrough bleeding with IUD   3. Dyspareunia, female   4. History of dysfunctional uterine bleeding   5. Preoperative exam for gynecologic surgery     Plan:  - Patient desires definitive management with hysterectomy.  I proposed doing a robotic-assisted total laparoscopic prophylactic bilateral salpingectomy, IUD removal.  No  indication for oophorectomy.  Patient agrees with this proposed surgery.  The risks of surgery were discussed in detail with the patient including but not limited to: bleeding which may require transfusion or reoperation; infection which may require antibiotics; injury to bowel, bladder, ureters or other surrounding organs; need for additional procedures including laparotomy or subsequent procedures secondary to abnormal pathology; formation of adhesions; thromboembolic phenomenon; incisional problems and other postoperative/anesthesia complications.  Patient was also advised that she will be discharged home after surgery; and expected recovery time after a hysterectomy is 6-8 weeks.  Patient was told that the likelihood that her condition  and symptoms will be treated effectively with this surgical management was very high; the postoperative expectations were also discussed in detail. The patient also understands the alternative treatment options which were discussed in full. All questions were answered.  She was told that she will be contacted by our surgical scheduler regarding the time and date of  her surgery; routine preoperative instructions will be given to her by the preoperative nursing team.  Routine postoperative instructions will be reviewed with the patient in detail after surgery.  In the meantime, she will continue use of her IUD until her surgery; bleeding precautions were reviewed. Surgery scheduled for 06/11/2023. Will remove IUD at time of surgery.  - Preop testing ordered. - Instructions reviewed, including NPO after midnight.    Hildred Laser, MD [facility]

## 2023-05-23 NOTE — Patient Instructions (Signed)
GYNECOLOGY PRE-OPERATIVE INSTRUCTIONS  You are scheduled for surgery on 06/11/2023.  The name of your procedure is: Robotic Total Laparoscopic Hysterectomy with Bilateral Salpingectomy.   Please read through these instructions carefully regarding preparation for your surgery: Nothing to eat after midnight on the day prior to surgery.  Do not take any medications unless recommended by your provider on day prior to surgery.  Do not take NSAIDs (Motrin, Aleve) or aspirin 7 days prior to surgery.  You may take Tylenol products for minor aches and pains.  You will receive a prescription for pain medications post-operatively.  You will be contacted by phone approximately 1-2 weeks prior to surgery to schedule your pre-operative appointment.  You will need someone to drive you to and from the hospital. You will not be allowed to drive after surgery.  Please call the office if you have any questions regarding your upcoming surgery.    Thank you for choosing [facility] .      Robotic Assisted Hysterectomy  This surgical method provides a high-powered 3-D view of the operating area, permits an extensive range of motion that is more precise the human hand and allows use of surgical instruments from angles and positions that would be difficult to otherwise achieve.  OVERVIEW What is robotic assisted hysterectomy? Robotic assisted hysterectomy is a type of surgery that uses surgeon-controlled robotic equipment to remove your uterus.  Hysterectomy is the surgical removal of the uterus. The uterus is a hollow, muscular organ located in a woman's pelvis. Having a hysterectomy ends menstruation and the ability to become pregnant. Depending on the reason for the surgery, a hysterectomy may also involve the removal of other organs and tissues, such as the ovaries and/or fallopian tubes.  A robotic assisted surgery system consists of two separate pieces of equipment. One robotic piece of equipment is  located next to the patient in the operating room. This robotic piece has four arms, which are long thin tubes that are attached to either a thin surgical instrument or a tiny camera. The surgical instruments and camera enter the patient's body through small  inch cuts (incisions) in the abdomen.  A short distance away from the operating table, the surgeon is seated in front of a separate computerized piece of equipment that looks like a video game. The surgeon controls the movements of the robotic arms and instruments with hand-held controls. The surgeon looks through binocular-like lenses on the equipment and a computer generates a three-dimensional view of the operating area. Foot pedals control the camera and allow the surgeon to zoom in or out to change the surgical view.  In what ways does robotic assisted surgery help the surgeon? The robotic assisted surgery is a computer-enhanced surgical system that gives surgeons the advantages of:  A 3-D view of the surgical field, including depth, up to 15 times the magnification and high resolution Instruments that mimic the movement of the human hands, wrists and fingers, allowing an extensive range of motion that is more precise than the surgeon's natural hand and wrist movements A constant steadiness of the robot arms and instruments and robot wrists that make it easier for surgeons to operate on organs and tissues for long periods of time and from angles and positions they would have difficulty reaching with human hands and fingers The surgeon controls every precise movement of the robotic arms and instruments. The robotic arms cannot move on their own.  Who may benefit from robotic-assisted hysterectomy? Robotic assisted hysterectomy may be  especially helpful in:  Patients who are obese Patients who have endometrial cancer Patients who have complex surgical cases, such as advanced stage endometriosis or pelvic adhesive disease (scar tissue that  binds nearby organs together) You and your surgeon will discuss if robotic assisted surgery is possible and appropriate for your specific condition.  PROCEDURE DETAILS To perform robotic-assisted surgery, a surgeon is seated in front of a computerized console that provides a high-powered view of the operating area. Movement of robotic arms attached to surgical instruments is controlled by the surgeon seated at the console a few feet away. Typical operating room setup for robotic assisted surgery. The surgeon is seated seated in front of a computerized console that provides a high-powered, 3-D view of the operating area. The surgical instruments, attached to robotic arms, are controlled by the surgeon seated at the console. What happens before and during robotic assisted hysterectomy? Before the procedure  Before your surgery, your doctor will perform a physical exam, order blood and urine tests and may order other tests to check your general health. Your doctor will tell you which of your current medications can continue to be taken and which will need to be temporarily stopped before surgery. You will be given instructions on when to stop eating and drinking the evening before and morning of your surgery. Your surgeon will explain the procedure in detail, including possible complications and side effects. He or she will also answer your questions.  On the day of surgery:  A urinary catheter may be inserted to empty your bladder Your abdominal area will be cleaned with a sterile solution An intravenous (IV) line will be placed in a vein in your arm to deliver medications and fluids During the procedure  After receiving anesthesia, your surgeon will make four or five small surgical cuts (incisions) in your abdomen (belly). The thin surgical instruments and tiny lighted camera attached to the arms of the surgical robot are inserted into the abdomen through these incisions.  The surgeon controls the  precise movement of the robotic arms, surgical instruments and camera while seated at a computer console. Members of the surgical team stand next to the operating table to change the robotic instruments and provide other assistance to the surgeon as needed.  Your surgeon typically removes the uterus through the vagina, like when delivering a baby. In certain cases, the uterus is removed through the small incisions in your abdomen.  An anesthesiologist monitors your anesthesia and vital signs throughout your operation.  How long does robotic assisted hysterectomy take to complete? Robotic assisted hysterectomy typically takes between one to four hours to complete, depending upon the surgeon and the complexity of the case.  What's the typical recovery time with robotic assisted hysterectomy? Robotic hysterectomy is an outpatient procedure. You may stay in the hospital overnight but some woman can be released the same day of surgery. You will able return to light regular activities the next day (walking, eating, walking up stairs). You can drive in about a week or less at the discretion of your physician and return to exercising in about four to six weeks. Your doctor will review your progress and tell you when you can return to your normal activities.  RISKS / BENEFITS What are the advantages of robot-assisted surgery for patients compared with traditional open surgery? Compared with traditional open surgery, the benefits of robotic assisted surgery may include:  Less blood loss during surgery Smaller incisions with less scarring. Surgery is performed through small  incisions instead of the large incision of open surgery Less post-op pain Decreased risk of infection Shorter hospital stay Shorter recovery time and quicker return to previous activities. You can usually resume normal activities as soon as you feel up to it. What are the risks of robotic hysterectomy? Robot-assisted hysterectomy  takes more time compared to other hysterectomy methods, such as traditional open hysterectomy performed by a surgeon. Longer surgeries may increase your risk for complications.  Like any surgical procedure, robotic hysterectomy carries risks including:  Bleeding Damage to the bladder and other nearby organs Infection Reaction to anesthesia Blood clots that form in the legs and can travel to your lungs  RECOVERY AND OUTLOOK What is the prognosis (outlook) for people who have robotic hysterectomy? Most women recover from robotic hysterectomy in less time and with less pain compared to traditional, open hysterectomies. Because the incisions are small, people can return to their daily activities more quickly. With the exception of hysterectomy for cervical cancer, outcomes with robotic surgery are as good as open surgery with shorter recovery. Robotic surgery is NOT recommended for hysterectomies done for cervical cancer as cancer-related outcomes are significantly worse.

## 2023-05-23 NOTE — H&P (View-Only) (Signed)
GYNECOLOGY PREOPERATIVE HISTORY AND PHYSICAL   Subjective:  Amanda Nash is a 37 y.o. G1P0010 here for surgical management of pelvic pain, dyspareunia, history of dysfunctional bleeding, and breakthrough bleeding with IUD.  No significant preoperative concerns.  Proposed surgery: IUD removal, Robotic-assisted total laparoscopic hysterectomy with bilateral salpingectomy    Pertinent Gynecological History: Menses:  irregular Contraception: IUD Last pap: normal Date: 02/27/2023. Results: normal    Past Medical History:  Diagnosis Date   ADHD    Anxiety    Bipolar 1 disorder (HCC) 2016   Borderline personality disorder (HCC)    Depression    Epistaxis, recurrent 02/13/2022   Hypertension     Past Surgical History:  Procedure Laterality Date   DILATATION & CURRETTAGE/HYSTEROSCOPY WITH RESECTOCOPE  2011    OB History  Gravida Para Term Preterm AB Living  1       1    SAB IAB Ectopic Multiple Live Births  1 0          # Outcome Date GA Lbr Len/2nd Weight Sex Type Anes PTL Lv  1 SAB 2010            Family History  Problem Relation Age of Onset   Depression Mother    Anxiety disorder Mother    Heart disease Mother    Bipolar disorder Father    Hypertension Father    Diabetes Father    Depression Brother    Anxiety disorder Brother    Colon cancer Maternal Grandfather     Social History   Socioeconomic History   Marital status: Married    Spouse name: Argelia Thurow   Number of children: 0   Years of education: 14   Highest education level: Associate degree: academic program  Occupational History   Occupation: Public house manager    Comment: [employer]  Tobacco Use   Smoking status: Every Day    Types: Cigarettes, E-cigarettes   Smokeless tobacco: Never   Tobacco comments:    Vape daily - currently 01/30/2023, no cigarettes  Vaping Use   Vaping status: Every Day  Substance and Sexual Activity   Alcohol use: Not Currently    Alcohol/week: 7.0  standard drinks of alcohol    Types: 5 Cans of beer, 2 Shots of liquor per week   Drug use: Not Currently   Sexual activity: Yes    Partners: Male    Birth control/protection: I.U.D.    Comment: Mirena  Other Topics Concern   Not on file  Social History Narrative   Live with husband and her brother, has 2 dogs and a bunny at home. Has stepdaughter that does not live with them.   Social Determinants of Health   Financial Resource Strain: Low Risk  (12/02/2018)   Overall Financial Resource Strain (CARDIA)    Difficulty of Paying Living Expenses: Not hard at all  Food Insecurity: No Food Insecurity (12/02/2018)   Hunger Vital Sign    Worried About Running Out of Food in the Last Year: Never true    Ran Out of Food in the Last Year: Never true  Transportation Needs: No Transportation Needs (12/02/2018)   PRAPARE - Administrator, Civil Service (Medical): No    Lack of Transportation (Non-Medical): No  Physical Activity: Inactive (12/02/2018)   Exercise Vital Sign    Days of Exercise per Week: 0 days    Minutes of Exercise per Session: 0 min  Stress: Stress Concern Present (12/02/2018)   [place]  Institute of Occupational Health - Occupational Stress Questionnaire    Feeling of Stress : Rather much  Social Connections: Moderately Integrated (12/02/2018)   Social Connection and Isolation Panel [NHANES]    Frequency of Communication with Friends and Family: More than three times a week    Frequency of Social Gatherings with Friends and Family: More than three times a week    Attends Religious Services: More than 4 times per year    Active Member of [employer] or Organizations: No    Attends Banker Meetings: Never    Marital Status: Married  Catering manager Violence: Not At Risk (12/02/2018)   Humiliation, Afraid, Rape, and Kick questionnaire    Fear of Current or Ex-Partner: No    Emotionally Abused: No    Physically Abused: No    Sexually Abused: No    Current  Outpatient Medications on File Prior to Visit  Medication Sig Dispense Refill   ARIPiprazole (ABILIFY) 10 MG tablet Take by mouth.     atomoxetine (STRATTERA) 80 MG capsule Take by mouth.     celecoxib (CELEBREX) 100 MG capsule Take 1 capsule (100 mg total) by mouth 2 (two) times daily as needed. 60 capsule 1   gabapentin (NEURONTIN) 400 MG capsule Take 400 mg by mouth 2 (two) times daily.     lamoTRIgine (LAMICTAL) 100 MG tablet Take by mouth.     levonorgestrel (MIRENA) 20 MCG/DAY IUD by Intrauterine route.     losartan-hydrochlorothiazide (HYZAAR) 50-12.5 MG tablet Take 1 tablet by mouth daily.     norethindrone (AYGESTIN) 5 MG tablet Take 1 tablet (5 mg total) by mouth daily. 90 tablet 0   nortriptyline (PAMELOR) 10 MG capsule Take nortriptyline 30 mg nightly for 1 week, then if symptoms fail to improve increase to 40 mg nightly and continue this dose.     nystatin-triamcinolone ointment (MYCOLOG) Apply 1 Application topically 2 (two) times daily. 30 g 0   pantoprazole (PROTONIX) 40 MG tablet Take 1 tablet by mouth once daily 30 tablet 0   propranolol (INDERAL) 40 MG tablet Take 40 mg by mouth 2 (two) times daily.     rizatriptan (MAXALT-MLT) 5 MG disintegrating tablet Take by mouth.     SUMAtriptan (IMITREX) 50 MG tablet Take by mouth.     tirzepatide (MOUNJARO) 5 MG/0.5ML Pen Inject 5 mg into the skin once a week. Start after 1 month of the 2.5 mg dose. 6 mL 1   topiramate (TOPAMAX) 25 MG tablet Take 25 mg by mouth 2 (two) times daily.     traZODone (DESYREL) 150 MG tablet Take 150 mg by mouth at bedtime.     Vitamin D, Ergocalciferol, (DRISDOL) 1.25 MG (50000 UNIT) CAPS capsule Take 1 capsule (50,000 Units total) by mouth every 7 (seven) days. Take for 8 total doses(weeks) 8 capsule 0   No current facility-administered medications on file prior to visit.   No Known Allergies    Review of Systems Constitutional: No recent fever/chills/sweats Respiratory: No recent  cough/bronchitis Cardiovascular: No chest pain Gastrointestinal: No recent nausea/vomiting/diarrhea Genitourinary: No UTI symptoms Hematologic/lymphatic:No history of coagulopathy or recent blood thinner use    Objective:   Blood pressure (!) 123/91, pulse 70, resp. rate 16, height 5\' 2"  (1.575 m), weight 270 lb 14.4 oz (122.9 kg). CONSTITUTIONAL: Well-developed, well-nourished female in no acute distress.  HENT:  Normocephalic, atraumatic, External right and left ear normal. Oropharynx is clear and moist EYES: Conjunctivae and EOM are normal. Pupils are equal, round,  and reactive to light. No scleral icterus.  NECK: Normal range of motion, supple, no masses SKIN: Skin is warm and dry. No rash noted. Not diaphoretic. No erythema. No pallor. NEUROLOGIC: Alert and oriented to person, place, and time. Normal reflexes, muscle tone coordination. No cranial nerve deficit noted. PSYCHIATRIC: Normal mood and affect. Normal behavior. Normal judgment and thought content. CARDIOVASCULAR: Normal heart rate noted, regular rhythm RESPIRATORY: Effort and breath sounds normal, no problems with respiration noted ABDOMEN: Soft, nontender, nondistended. PELVIC: External genitalia normal, rectovaginal septum normal.  Vagina without discharge.  Cervix normal appearing, no lesions and no motion tenderness.  Uterus , normal shape and size.  Adnexae non-palpable, nontender bilaterally.   MUSCULOSKELETAL: Normal range of motion. No edema and no tenderness. 2+ distal pulses.    Labs: Lab Results  Component Value Date   WBC 7.5 01/15/2023   HGB 13.9 01/15/2023   HCT 42.5 01/15/2023   MCV 82 01/15/2023   PLT 358 01/15/2023     Imaging Studies: US PELVIS TRANSVAGINAL NON-OB (TV ONLY) ULTRASOUND REPORT   Location: [facility] Date of Service: 03/29/2023        Indications:Abnormal Uterine Bleeding Findings:  The uterus is anteverted and measures 8.43 x 5.57 x 4.50 cm. Echo texture  is homogenous without evidence of focal masses.   The Endometrium measures 3.85 mm. IUD appears within endo - slightly  shifted down 0.53 cm from fundus   Right Ovary measures 3.12 x 2.56 x 2.54  cm. It is normal in appearance.  Simple rt ovarian cyst seen measuring; 1.84 x 2.04 x 2.09 cm Left Ovary measures 3.03 x 2.09 x 1.94 cm. It is normal in appearance. Survey of the adnexa demonstrates no adnexal masses. There is no free fluid in the cul de sac.   Impression: 1. IUD slightly shifted down in endometrium  (1/2 cm) 2. Small rt ovarian cyst   Recommendations: 1.Clinical correlation with the patient's History and Physical Exam.   Waldo Laine, RT  The ultrasound images and findings were reviewed by me and I agree with  the above report.  Elonda Husky, M.D. 04/08/2023 9:56 PM    Assessment:    1. Pelvic pain   2. Breakthrough bleeding with IUD   3. Dyspareunia, female   4. History of dysfunctional uterine bleeding   5. Preoperative exam for gynecologic surgery     Plan:  - Patient desires definitive management with hysterectomy.  I proposed doing a robotic-assisted total laparoscopic prophylactic bilateral salpingectomy, IUD removal.  No  indication for oophorectomy.  Patient agrees with this proposed surgery.  The risks of surgery were discussed in detail with the patient including but not limited to: bleeding which may require transfusion or reoperation; infection which may require antibiotics; injury to bowel, bladder, ureters or other surrounding organs; need for additional procedures including laparotomy or subsequent procedures secondary to abnormal pathology; formation of adhesions; thromboembolic phenomenon; incisional problems and other postoperative/anesthesia complications.  Patient was also advised that she will be discharged home after surgery; and expected recovery time after a hysterectomy is 6-8 weeks.  Patient was told that the likelihood that her condition  and symptoms will be treated effectively with this surgical management was very high; the postoperative expectations were also discussed in detail. The patient also understands the alternative treatment options which were discussed in full. All questions were answered.  She was told that she will be contacted by our surgical scheduler regarding the time and date of  her surgery; routine preoperative instructions will be given to her by the preoperative nursing team.  Routine postoperative instructions will be reviewed with the patient in detail after surgery.  In the meantime, she will continue use of her IUD until her surgery; bleeding precautions were reviewed. Surgery scheduled for 06/11/2023. Will remove IUD at time of surgery.  - Preop testing ordered. - Instructions reviewed, including NPO after midnight.    Hildred Laser, MD [facility]

## 2023-05-25 LAB — SURGICAL PATHOLOGY

## 2023-05-26 ENCOUNTER — Encounter: Payer: Self-pay | Admitting: Student in an Organized Health Care Education/Training Program

## 2023-05-27 ENCOUNTER — Encounter: Payer: Self-pay | Admitting: Family Medicine

## 2023-05-29 ENCOUNTER — Other Ambulatory Visit: Payer: Self-pay

## 2023-05-29 DIAGNOSIS — E1159 Type 2 diabetes mellitus with other circulatory complications: Secondary | ICD-10-CM

## 2023-06-01 ENCOUNTER — Encounter
Admission: RE | Admit: 2023-06-01 | Discharge: 2023-06-01 | Disposition: A | Discharge status: Home / Self Care | Payer: Managed Care, Other (non HMO) | Source: Ambulatory Visit | Attending: Obstetrics and Gynecology | Admitting: Obstetrics and Gynecology

## 2023-06-01 ENCOUNTER — Other Ambulatory Visit: Payer: Self-pay

## 2023-06-01 VITALS — Ht 62.0 in | Wt 265.0 lb

## 2023-06-01 DIAGNOSIS — I10 Essential (primary) hypertension: Secondary | ICD-10-CM

## 2023-06-01 DIAGNOSIS — Z01818 Encounter for other preprocedural examination: Secondary | ICD-10-CM

## 2023-06-01 DIAGNOSIS — E119 Type 2 diabetes mellitus without complications: Secondary | ICD-10-CM

## 2023-06-01 DIAGNOSIS — E785 Hyperlipidemia, unspecified: Secondary | ICD-10-CM

## 2023-06-01 HISTORY — DX: Thrombocytosis, unspecified: D75.839

## 2023-06-01 HISTORY — DX: Other intervertebral disc degeneration, lumbar region without mention of lumbar back pain or lower extremity pain: M51.369

## 2023-06-01 HISTORY — DX: Sleep apnea, unspecified: G47.30

## 2023-06-01 HISTORY — DX: Anemia, unspecified: D64.9

## 2023-06-01 HISTORY — DX: Headache, unspecified: R51.9

## 2023-06-01 HISTORY — DX: Segmental and somatic dysfunction of cervical region: M99.01

## 2023-06-01 HISTORY — DX: Hyperlipidemia, unspecified: E78.5

## 2023-06-01 HISTORY — DX: Type 2 diabetes mellitus without complications: E11.9

## 2023-06-01 HISTORY — DX: Gastro-esophageal reflux disease without esophagitis: K21.9

## 2023-06-01 HISTORY — DX: Other intervertebral disc degeneration, lumbar region: M51.36

## 2023-06-01 NOTE — Patient Instructions (Addendum)
Your procedure is scheduled on: Monday 06/11/23 To find out your arrival time, please call [phone] between 1PM - 3PM on:   Friday 06/08/23 Report to the Registration Desk on the 1st floor of the [facility]. FREE Valet parking is available.  If your arrival time is 6:00 am, do not arrive before that time as the [facility] entrance doors do not open until 6:00 am.  REMEMBER: Instructions that are not followed completely may result in serious medical risk, up to and including death; or upon the discretion of your surgeon and anesthesiologist your surgery may need to be rescheduled.  Do not eat food after midnight the night before surgery.  No gum chewing or hard candies.  You may however, drink CLEAR liquids up to 2 hours before you are scheduled to arrive for your surgery. Do not drink anything within 2 hours of your scheduled arrival time.  Clear liquids include: - water  - apple juice without pulp - gatorade (not RED colors) - black coffee or tea (Do NOT add milk or creamers to the coffee or tea) Do NOT drink anything that is not on this list.  Type 1 and Type 2 diabetics should only drink water.  In addition, your doctor has ordered for you to drink the provided:  Ensure Pre-Surgery Clear Carbohydrate Drink  Drinking this carbohydrate drink up to two hours before surgery helps to reduce insulin resistance and improve patient outcomes. Please complete drinking 2 hours before scheduled arrival time.  One week prior to surgery: Stop Anti-inflammatories (NSAIDS) such as Advil, Aleve, Ibuprofen, Motrin, Naproxen, Naprosyn and Aspirin based products such as Excedrin, Goody's Powder, BC Powder. You may however, continue to take Tylenol if needed for pain up until the day of surgery.  Stop ANY OVER THE COUNTER supplements and vitamins until after surgery.  Continue taking all prescribed medications with the exception of the following: Mounjaro, your last dose will be Sunday  06/03/23. You can restart after surgery.  TAKE ONLY THESE MEDICATIONS THE MORNING OF SURGERY WITH A SIP OF WATER:  ARIPiprazole (ABILIFY) 10 MG tablet  lamoTRIgine (LAMICTAL) 100 MG tablet  pantoprazole (PROTONIX) 40 MG tablet Antacid (take one the night before and one on the morning of surgery - helps to prevent nausea after surgery.) propranolol (INDERAL) 40 MG tablet  SUMAtriptan (IMITREX) 50 MG tablet  topiramate (TOPAMAX) 25 MG tablet   No Alcohol for 24 hours before or after surgery.  No Smoking including e-cigarettes for 24 hours before surgery.  No chewable tobacco products for at least 6 hours before surgery.  No nicotine patches on the day of surgery.  Do not use any "recreational" drugs for at least a week (preferably 2 weeks) before your surgery.  Please be advised that the combination of cocaine and anesthesia may have negative outcomes, up to and including death. If you test positive for cocaine, your surgery will be cancelled.  On the morning of surgery brush your teeth with toothpaste and water, you may rinse your mouth with mouthwash if you wish. Do not swallow any toothpaste or mouthwash.  Use CHG Soap or wipes as directed on instruction sheet.  Do not wear lotions, powders, or perfumes.   Do not shave body hair from the neck down 48 hours before surgery.  Wear comfortable clothing (specific to your surgery type) to the hospital.  Do not wear jewelry, make-up, hairpins, clips or nail polish.  For welded (permanent) jewelry: bracelets, anklets, waist bands, etc.  Please have  this removed prior to surgery.  If it is not removed, there is a chance that hospital personnel will need to cut it off on the day of surgery. Contact lenses, hearing aids and dentures may not be worn into surgery.  Bring your C-PAP to the hospital in case you may have to spend the night.   Do not bring valuables to the hospital. [facility] is not responsible for any missing/lost belongings  or valuables.   Notify your doctor if there is any change in your medical condition (cold, fever, infection).  If you are being discharged the day of surgery, you will not be allowed to drive home. You will need a responsible individual to drive you home and stay with you for 24 hours after surgery.   If you are taking public transportation, you will need to have a responsible individual with you.  If you are being admitted to the hospital overnight, leave your suitcase in the car. After surgery it may be brought to your room.  In case of increased patient census, it may be necessary for you, the patient, to continue your postoperative care in the Same Day Surgery department.  After surgery, you can help prevent lung complications by doing breathing exercises.  Take deep breaths and cough every 1-2 hours. Your doctor may order a device called an Incentive Spirometer to help you take deep breaths. When coughing or sneezing, hold a pillow firmly against your incision with both hands. This is called "splinting." Doing this helps protect your incision. It also decreases belly discomfort.  Surgery Visitation Policy:  Patients undergoing a surgery or procedure may have two family members or support persons with them as long as the person is not COVID-19 positive or experiencing its symptoms.   Inpatient Visitation:    Visiting hours are 7 a.m. to 8 p.m. Up to four visitors are allowed at one time in a patient room. The visitors may rotate out with other people during the day. One designated support person (adult) may remain overnight.  Please call the Pre-admissions Testing Dept. at [phone] if you have any questions about these instructions.     Preparing for Surgery with CHLORHEXIDINE GLUCONATE (CHG) Soap  Chlorhexidine Gluconate (CHG) Soap  o An antiseptic cleaner that kills germs and bonds with the skin to continue killing germs even after washing  o Used for showering the  night before surgery and morning of surgery  Before surgery, you can play an important role by reducing the number of germs on your skin.  CHG (Chlorhexidine gluconate) soap is an antiseptic cleanser which kills germs and bonds with the skin to continue killing germs even after washing.  Please do not use if you have an allergy to CHG or antibacterial soaps. If your skin becomes reddened/irritated stop using the CHG.  1. Shower the NIGHT BEFORE SURGERY and the MORNING OF SURGERY with CHG soap.  2. If you choose to wash your hair, wash your hair first as usual with your normal shampoo.  3. After shampooing, rinse your hair and body thoroughly to remove the shampoo.  4. Use CHG as you would any other liquid soap. You can apply CHG directly to the skin and wash gently with a scrungie or a clean washcloth.  5. Apply the CHG soap to your body only from the neck down. Do not use on open wounds or open sores. Avoid contact with your eyes, ears, mouth, and genitals (private parts). Wash face and genitals (private  parts) with your normal soap.  6. Wash thoroughly, paying special attention to the area where your surgery will be performed.  7. Thoroughly rinse your body with warm water.  8. Do not shower/wash with your normal soap after using and rinsing off the CHG soap.  9. Pat yourself dry with a clean towel.  10. Wear clean pajamas to bed the night before surgery.  12. Place clean sheets on your bed the night of your first shower and do not sleep with pets.  13. Shower again with the CHG soap on the day of surgery prior to arriving at the hospital.  14. Do not apply any deodorants/lotions/powders.  15. Please wear clean clothes to the hospital.

## 2023-06-06 ENCOUNTER — Encounter: Payer: Self-pay | Admitting: Student in an Organized Health Care Education/Training Program

## 2023-06-06 ENCOUNTER — Ambulatory Visit: Payer: Managed Care, Other (non HMO) | Admitting: Student in an Organized Health Care Education/Training Program

## 2023-06-06 ENCOUNTER — Ambulatory Visit
Admission: RE | Admit: 2023-06-06 | Discharge: 2023-06-06 | Disposition: A | Discharge status: Home / Self Care | Payer: Managed Care, Other (non HMO) | Source: Ambulatory Visit | Attending: Student in an Organized Health Care Education/Training Program | Admitting: Student in an Organized Health Care Education/Training Program

## 2023-06-06 ENCOUNTER — Encounter
Admission: RE | Admit: 2023-06-06 | Discharge: 2023-06-06 | Disposition: A | Discharge status: Home / Self Care | Payer: Managed Care, Other (non HMO) | Source: Ambulatory Visit | Attending: Obstetrics and Gynecology | Admitting: Obstetrics and Gynecology

## 2023-06-06 DIAGNOSIS — M5136 Other intervertebral disc degeneration, lumbar region: Secondary | ICD-10-CM

## 2023-06-06 DIAGNOSIS — M47816 Spondylosis without myelopathy or radiculopathy, lumbar region: Secondary | ICD-10-CM | POA: Insufficient documentation

## 2023-06-06 DIAGNOSIS — E785 Hyperlipidemia, unspecified: Secondary | ICD-10-CM | POA: Insufficient documentation

## 2023-06-06 DIAGNOSIS — G8929 Other chronic pain: Secondary | ICD-10-CM | POA: Diagnosis not present

## 2023-06-06 DIAGNOSIS — E119 Type 2 diabetes mellitus without complications: Secondary | ICD-10-CM | POA: Insufficient documentation

## 2023-06-06 DIAGNOSIS — Z01818 Encounter for other preprocedural examination: Secondary | ICD-10-CM | POA: Diagnosis present

## 2023-06-06 DIAGNOSIS — I10 Essential (primary) hypertension: Secondary | ICD-10-CM | POA: Insufficient documentation

## 2023-06-06 LAB — COMPREHENSIVE METABOLIC PANEL
ALT: 29 U/L (ref 0–44)
AST: 15 U/L (ref 15–41)
Albumin: 3.8 g/dL (ref 3.5–5.0)
Alkaline Phosphatase: 53 U/L (ref 38–126)
Anion gap: 7 (ref 5–15)
BUN: 17 mg/dL (ref 6–20)
CO2: 24 mmol/L (ref 22–32)
Calcium: 8.9 mg/dL (ref 8.9–10.3)
Chloride: 106 mmol/L (ref 98–111)
Creatinine, Ser: 0.84 mg/dL (ref 0.44–1.00)
GFR, Estimated: >60 mL/min (ref >60)
Glucose, Bld: 116 mg/dL — ABNORMAL HIGH (ref 70–99)
Potassium: 3.7 mmol/L (ref 3.5–5.1)
Sodium: 137 mmol/L (ref 135–145)
Total Bilirubin: 0.5 mg/dL (ref 0.3–1.2)
Total Protein: 7.3 g/dL (ref 6.5–8.1)

## 2023-06-06 LAB — CBC
HCT: 41.6 % (ref 36.0–46.0)
Hemoglobin: 13.8 g/dL (ref 12.0–15.0)
MCH: 27.3 pg (ref 26.0–34.0)
MCHC: 33.2 g/dL (ref 30.0–36.0)
MCV: 82.2 fL (ref 80.0–100.0)
Platelets: 359 10*3/uL (ref 150–400)
RBC: 5.06 MIL/uL (ref 3.87–5.11)
RDW: 14.6 % (ref 11.5–15.5)
WBC: 7.8 10*3/uL (ref 4.0–10.5)
nRBC: 0 % (ref 0.0–0.2)

## 2023-06-06 LAB — TYPE AND SCREEN
ABO/RH(D): O POS
Antibody Screen: NEGATIVE

## 2023-06-06 MED ORDER — DEXAMETHASONE SODIUM PHOSPHATE 10 MG/ML IJ SOLN
10.0000 mg | Freq: Once | INTRAMUSCULAR | Status: AC
  Administered 2023-06-06: 10 mg

## 2023-06-06 MED ORDER — LIDOCAINE HCL 2 % IJ SOLN
20.0000 mL | Freq: Once | INTRAMUSCULAR | Status: AC
  Administered 2023-06-06: 400 mg

## 2023-06-06 MED ORDER — LACTATED RINGERS IV SOLN: Freq: Once | INTRAVENOUS | Status: AC

## 2023-06-06 MED ORDER — DEXAMETHASONE SODIUM PHOSPHATE 10 MG/ML IJ SOLN
INTRAMUSCULAR | Status: AC
  Filled 2023-06-06: qty 2

## 2023-06-06 MED ORDER — ROPIVACAINE HCL 2 MG/ML IJ SOLN
9.0000 mL | Freq: Once | INTRAMUSCULAR | Status: AC
  Administered 2023-06-06: 9 mL via PERINEURAL

## 2023-06-06 MED ORDER — MIDAZOLAM HCL 2 MG/2ML IJ SOLN
0.5000 mg | Freq: Once | INTRAMUSCULAR | Status: AC
  Administered 2023-06-06: 2 mg via INTRAVENOUS

## 2023-06-06 MED ORDER — LIDOCAINE HCL 2 % IJ SOLN
INTRAMUSCULAR | Status: AC
  Filled 2023-06-06: qty 20

## 2023-06-06 MED ORDER — MIDAZOLAM HCL 2 MG/2ML IJ SOLN
INTRAMUSCULAR | Status: AC
  Filled 2023-06-06: qty 2

## 2023-06-06 MED ORDER — ROPIVACAINE HCL 2 MG/ML IJ SOLN
INTRAMUSCULAR | Status: AC
  Filled 2023-06-06: qty 20

## 2023-06-06 NOTE — Progress Notes (Signed)
Safety precautions to be maintained throughout the outpatient stay will include: orient to surroundings, keep bed in low position, maintain call bell within reach at all times, provide assistance with transfer out of bed and ambulation.  

## 2023-06-06 NOTE — Patient Instructions (Signed)

## 2023-06-06 NOTE — Progress Notes (Signed)
PROVIDER NOTE: Interpretation of information contained herein should be left to medically-trained personnel. Specific patient instructions are provided elsewhere under "Patient Instructions" section of medical record. This document was created in part using STT-dictation technology, any transcriptional errors that may result from this process are unintentional.  Patient: Amanda Nash Type: Established DOB: 09-03-1986 MRN: [id number] PCP: Jerrol Banana, MD  Service: Procedure DOS: 06/06/2023 Setting: Ambulatory Location: Ambulatory outpatient facility Delivery: Face-to-face Provider: Edward Jolly, MD Specialty: Interventional Pain Management Specialty designation: 09 Location: Outpatient facility Ref. Prov.: Edward Jolly, MD       Interventional Therapy   Procedure: Lumbar Facet, Medial Branch Block(s) #2  Laterality: Bilateral  Level: L3, L4, and L5 Medial Branch Level(s). Injecting these levels blocks the L3-4 and L4-5 lumbar facet joints. Imaging: Fluoroscopic guidance         Anesthesia: Local anesthesia (1-2% Lidocaine) DOS: 06/06/2023 Performed by: Edward Jolly, MD  Primary Purpose: Diagnostic/Therapeutic Indications: Low back pain severe enough to impact quality of life or function. 1. Lumbar facet arthropathy   2. Chronic bilateral low back pain without sciatica   3. Lumbar degenerative disc disease    NAS-11 Pain score:   Pre-procedure: 4 /10   Post-procedure: 2 /10     Position / Prep / Materials:  Position: Prone  Prep solution: DuraPrep (Iodine Povacrylex [0.7% available iodine] and Isopropyl Alcohol, 74% w/w) Area Prepped: Posterolateral Lumbosacral Spine (Wide prep: From the lower border of the scapula down to the end of the tailbone and from flank to flank.)  Materials:  Tray: Block Needle(s):  Type: Spinal  Gauge (G): 22  Length: 5-in Qty: 3      H&P (Pre-op Assessment):  Amanda Nash is a 37 y.o. (year old), female patient, seen today for  interventional treatment. She  has a past surgical history that includes Dilatation & currettage/hysteroscopy with resectoscope (2011) and Wisdom tooth extraction. Amanda Nash has a current medication list which includes the following prescription(s): aripiprazole, atomoxetine, celecoxib, lamotrigine, levonorgestrel, losartan-hydrochlorothiazide, nortriptyline, nystatin-triamcinolone ointment, pantoprazole, propranolol, rizatriptan, sumatriptan, tirzepatide, topiramate, trazodone, vitamin d, norethindrone, and vitamin d (ergocalciferol), and the following Facility-Administered Medications: lactated ringers. Her primarily concern today is the Back Pain (lower)  Initial Vital Signs:  Pulse/HCG Rate: (!) 57  Temp: 97.9 F (36.6 C) Resp: 16 BP: 113/69 SpO2: 100 %  BMI: Estimated body mass index is 50.07 kg/m as calculated from the following:   Height as of this encounter: 5\' 1"  (1.549 m).   Weight as of this encounter: 265 lb (120.2 kg).  Risk Assessment: Allergies: Reviewed. She has No Known Allergies.  Allergy Precautions: None required Coagulopathies: Reviewed. None identified.  Blood-thinner therapy: None at this time Active Infection(s): Reviewed. None identified. Amanda Nash is afebrile  Site Confirmation: Amanda Nash was asked to confirm the procedure and laterality before marking the site Procedure checklist: Completed Consent: Before the procedure and under the influence of no sedative(s), amnesic(s), or anxiolytics, the patient was informed of the treatment options, risks and possible complications. To fulfill our ethical and legal obligations, as recommended by the American Medical Association's Code of Ethics, I have informed the patient of my clinical impression; the nature and purpose of the treatment or procedure; the risks, benefits, and possible complications of the intervention; the alternatives, including doing nothing; the risk(s) and benefit(s) of the alternative treatment(s) or  procedure(s); and the risk(s) and benefit(s) of doing nothing. The patient was provided information about the general risks and possible complications associated with the procedure. These  may include, but are not limited to: failure to achieve desired goals, infection, bleeding, organ or nerve damage, allergic reactions, paralysis, and death. In addition, the patient was informed of those risks and complications associated to Spine-related procedures, such as failure to decrease pain; infection (i.e.: Meningitis, epidural or intraspinal abscess); bleeding (i.e.: epidural hematoma, subarachnoid hemorrhage, or any other type of intraspinal or peri-dural bleeding); organ or nerve damage (i.e.: Any type of peripheral nerve, nerve root, or spinal cord injury) with subsequent damage to sensory, motor, and/or autonomic systems, resulting in permanent pain, numbness, and/or weakness of one or several areas of the body; allergic reactions; (i.e.: anaphylactic reaction); and/or death. Furthermore, the patient was informed of those risks and complications associated with the medications. These include, but are not limited to: allergic reactions (i.e.: anaphylactic or anaphylactoid reaction(s)); adrenal axis suppression; blood sugar elevation that in diabetics may result in ketoacidosis or comma; water retention that in patients with history of congestive heart failure may result in shortness of breath, pulmonary edema, and decompensation with resultant heart failure; weight gain; swelling or edema; medication-induced neural toxicity; particulate matter embolism and blood vessel occlusion with resultant organ, and/or nervous system infarction; and/or aseptic necrosis of one or more joints. Finally, the patient was informed that Medicine is not an exact science; therefore, there is also the possibility of unforeseen or unpredictable risks and/or possible complications that may result in a catastrophic outcome. The patient  indicated having understood very clearly. We have given the patient no guarantees and we have made no promises. Enough time was given to the patient to ask questions, all of which were answered to the patient's satisfaction. Ms. Miltenberger has indicated that she wanted to continue with the procedure. Attestation: I, the ordering provider, attest that I have discussed with the patient the benefits, risks, side-effects, alternatives, likelihood of achieving goals, and potential problems during recovery for the procedure that I have provided informed consent. Date  Time: 06/06/2023  8:23 AM   Pre-Procedure Preparation:  Monitoring: As per clinic protocol. Respiration, ETCO2, SpO2, BP, heart rate and rhythm monitor placed and checked for adequate function Safety Precautions: Patient was assessed for positional comfort and pressure points before starting the procedure. Time-out: I initiated and conducted the "Time-out" before starting the procedure, as per protocol. The patient was asked to participate by confirming the accuracy of the "Time Out" information. Verification of the correct person, site, and procedure were performed and confirmed by me, the nursing staff, and the patient. "Time-out" conducted as per Joint Commission's Universal Protocol (UP.01.01.01). Time: 0855 Start Time: 0855 hrs.  Description of Procedure:          Laterality: (see above) Targeted Levels: (see above)  Safety Precautions: Aspiration looking for blood return was conducted prior to all injections. At no point did we inject any substances, as a needle was being advanced. Before injecting, the patient was told to immediately notify me if she was experiencing any new onset of "ringing in the ears, or metallic taste in the mouth". No attempts were made at seeking any paresthesias. Safe injection practices and needle disposal techniques used. Medications properly checked for expiration dates. SDV (single dose vial) medications used.  After the completion of the procedure, all disposable equipment used was discarded in the proper designated medical waste containers. Local Anesthesia: Protocol guidelines were followed. The patient was positioned over the fluoroscopy table. The area was prepped in the usual manner. The time-out was completed. The target area was identified using fluoroscopy.  A 12-in long, straight, sterile hemostat was used with fluoroscopic guidance to locate the targets for each level blocked. Once located, the skin was marked with an approved surgical skin marker. Once all sites were marked, the skin (epidermis, dermis, and hypodermis), as well as deeper tissues (fat, connective tissue and muscle) were infiltrated with a small amount of a short-acting local anesthetic, loaded on a 10cc syringe with a 25G, 1.5-in  Needle. An appropriate amount of time was allowed for local anesthetics to take effect before proceeding to the next step. Local Anesthetic: Lidocaine 2.0% The unused portion of the local anesthetic was discarded in the proper designated containers. Technical description of process:   L3 Medial Branch Nerve Block (MBB): The target area for the L3 medial branch is at the junction of the postero-lateral aspect of the superior articular process and the superior, posterior, and medial edge of the transverse process of L4. Under fluoroscopic guidance, a Quincke needle was inserted until contact was made with os over the superior postero-lateral aspect of the pedicular shadow (target area). After negative aspiration for blood, 2 mL of the nerve block solution was injected without difficulty or complication. The needle was removed intact. L4 Medial Branch Nerve Block (MBB): The target area for the L4 medial branch is at the junction of the postero-lateral aspect of the superior articular process and the superior, posterior, and medial edge of the transverse process of L5. Under fluoroscopic guidance, a Quincke needle was  inserted until contact was made with os over the superior postero-lateral aspect of the pedicular shadow (target area). After negative aspiration for blood, 2mL of the nerve block solution was injected without difficulty or complication. The needle was removed intact. L5 Medial Branch Nerve Block (MBB): The target area for the L5 medial branch is at the junction of the postero-lateral aspect of the superior articular process and the superior, posterior, and medial edge of the sacral ala. Under fluoroscopic guidance, a Quincke needle was inserted until contact was made with os over the superior postero-lateral aspect of the pedicular shadow (target area). After negative aspiration for blood, 2mL of the nerve block solution was injected without difficulty or complication. The needle was removed intact.   Once the entire procedure was completed, the treated area was cleaned, making sure to leave some of the prepping solution back to take advantage of its long term bactericidal properties.         Illustration of the posterior view of the lumbar spine and the posterior neural structures. Laminae of L2 through S1 are labeled. DPRL5, dorsal primary ramus of L5; DPRS1, dorsal primary ramus of S1; DPR3, dorsal primary ramus of L3; FJ, facet (zygapophyseal) joint L3-L4; I, inferior articular process of L4; LB1, lateral branch of dorsal primary ramus of L1; IAB, inferior articular branches from L3 medial branch (supplies L4-L5 facet joint); IBP, intermediate branch plexus; MB3, medial branch of dorsal primary ramus of L3; NR3, third lumbar nerve root; S, superior articular process of L5; SAB, superior articular branches from L4 (supplies L4-5 facet joint also); TP3, transverse process of L3.   Facet Joint Innervation (* possible contribution)  L1-2 T12, L1 (L2*)  Medial Branch  L2-3 L1, L2 (L3*)         "          "  L3-4 L2, L3 (L4*)         "          "  L4-5 L3, L4 (L5*)         "          "  L5-S1 L4,  L5, S1          "          "    Vitals:   06/06/23 0855 06/06/23 0900 06/06/23 0905 06/06/23 0914  BP: 121/78 128/60 130/88 113/74  Pulse: (!) 56 (!) 59 (!) 53 64  Resp: 20 (!) 21 16   Temp:      TempSrc:      SpO2: 100% 100% 100% 100%  Weight:      Height:         End Time: 0905 hrs.  Imaging Guidance (Spinal):          Type of Imaging Technique: Fluoroscopy Guidance (Spinal) Indication(s): Assistance in needle guidance and placement for procedures requiring needle placement in or near specific anatomical locations not easily accessible without such assistance. Exposure Time: Please see nurses notes. Contrast: None used. Fluoroscopic Guidance: I was personally present during the use of fluoroscopy. "Tunnel Vision Technique" used to obtain the best possible view of the target area. Parallax error corrected before commencing the procedure. "Direction-depth-direction" technique used to introduce the needle under continuous pulsed fluoroscopy. Once target was reached, antero-posterior, oblique, and lateral fluoroscopic projection used confirm needle placement in all planes. Images permanently stored in EMR. Interpretation: No contrast injected. I personally interpreted the imaging intraoperatively. Adequate needle placement confirmed in multiple planes. Permanent images saved into the patient's record.  Post-operative Assessment:  Post-procedure Vital Signs:  Pulse/HCG Rate: 64  Temp: 97.9 F (36.6 C) Resp: 16 BP: 113/74 SpO2: 100 %  EBL: None  Complications: No immediate post-treatment complications observed by team, or reported by patient.  Note: The patient tolerated the entire procedure well. A repeat set of vitals were taken after the procedure and the patient was kept under observation following institutional policy, for this type of procedure. Post-procedural neurological assessment was performed, showing return to baseline, prior to discharge. The patient was provided with  post-procedure discharge instructions, including a section on how to identify potential problems. Should any problems arise concerning this procedure, the patient was given instructions to immediately contact us, at any time, without hesitation. In any case, we plan to contact the patient by telephone for a follow-up status report regarding this interventional procedure.  Comments:  No additional relevant information.  Plan of Care (POC)  Orders:  Orders Placed This Encounter  Procedures   DG PAIN CLINIC C-ARM 1-60 MIN NO REPORT    Intraoperative interpretation by procedural physician at [facility].    Standing Status:   Standing    Number of Occurrences:   1    Order Specific Question:   Reason for exam:    Answer:   Assistance in needle guidance and placement for procedures requiring needle placement in or near specific anatomical locations not easily accessible without such assistance.    Medications ordered for procedure: Meds ordered this encounter  Medications   lidocaine (XYLOCAINE) 2 % (with pres) injection 400 mg   dexamethasone (DECADRON) injection 10 mg   dexamethasone (DECADRON) injection 10 mg   ropivacaine (PF) 2 mg/mL (0.2%) (NAROPIN) injection 9 mL   ropivacaine (PF) 2 mg/mL (0.2%) (NAROPIN) injection 9 mL   lactated ringers infusion   midazolam (VERSED) injection 0.5-2 mg    Make sure Flumazenil is available in the pyxis when using this medication. If oversedation occurs, administer 0.2 mg IV over 15 sec. If after 45 sec no response, administer 0.2 mg again over 1 min; may repeat at 1  min intervals; not to exceed 4 doses (1 mg)   Medications administered: We administered lidocaine, dexamethasone, dexamethasone, ropivacaine (PF) 2 mg/mL (0.2%), ropivacaine (PF) 2 mg/mL (0.2%), lactated ringers, and midazolam.  See the medical record for exact dosing, route, and time of administration.  Follow-up plan:   Return in about 8 years (around 06/06/2031) for f58f  PPE.       Bilateral L3, L4, L5 lumbar facet medial branch nerve block 04/04/2023, 06/06/23    Recent Visits Date Type Provider Dept  04/25/23 Office Visit Edward Jolly, MD [facility]  04/04/23 Procedure visit Edward Jolly, MD [facility]  Showing recent visits within past 90 days and meeting all other requirements Today's Visits Date Type Provider Dept  06/06/23 Procedure visit Edward Jolly, MD [facility]  Showing today's visits and meeting all other requirements Future Appointments Date Type Provider Dept  07/25/23 Appointment Edward Jolly, MD [facility]  Showing future appointments within next 90 days and meeting all other requirements  Disposition: Discharge home  Discharge (Date  Time): 06/06/2023; 0916 hrs.   Primary Care Physician: Jerrol Banana, MD Location: [facility] Note by: Edward Jolly, MD (TTS technology used. I apologize for any typographical errors that were not detected and corrected.) Date: 06/06/2023; Time: 9:29 AM  Disclaimer:  Medicine is not an Visual merchandiser. The only guarantee in medicine is that nothing is guaranteed. It is important to note that the decision to proceed with this intervention was based on the information collected from the patient. The Data and conclusions were drawn from the patient's questionnaire, the interview, and the physical examination. Because the information was provided in large part by the patient, it cannot be guaranteed that it has not been purposely or unconsciously manipulated. Every effort has been made to obtain as much relevant data as possible for this evaluation. It is important to note that the conclusions that lead to this procedure are derived in large part from the available data. Always take into account that the treatment will also be dependent on availability of resources and existing treatment guidelines, considered by other Pain Management  Practitioners as being common knowledge and practice, at the time of the intervention. For Medico-Legal purposes, it is also important to point out that variation in procedural techniques and pharmacological choices are the acceptable norm. The indications, contraindications, technique, and results of the above procedure should only be interpreted and judged by a Board-Certified Interventional Pain Specialist with extensive familiarity and expertise in the same exact procedure and technique.

## 2023-06-07 ENCOUNTER — Telehealth: Payer: Self-pay | Admitting: *Deleted

## 2023-06-07 LAB — HEMOGLOBIN A1C
Est. average glucose Bld gHb Est-mCnc: 137 mg/dL
Hgb A1c MFr Bld: 6.4 % — ABNORMAL HIGH (ref 4.8–5.6)

## 2023-06-07 NOTE — Telephone Encounter (Signed)
Attempted to call for post procedure follow-up. Message left. 

## 2023-06-08 ENCOUNTER — Other Ambulatory Visit: Payer: Self-pay

## 2023-06-08 ENCOUNTER — Encounter: Payer: Self-pay | Admitting: Obstetrics and Gynecology

## 2023-06-08 DIAGNOSIS — Z0289 Encounter for other administrative examinations: Secondary | ICD-10-CM

## 2023-06-08 DIAGNOSIS — E1159 Type 2 diabetes mellitus with other circulatory complications: Secondary | ICD-10-CM

## 2023-06-08 MED ORDER — TIRZEPATIDE 5 MG/0.5ML ~~LOC~~ SOAJ: 5.0000 mg | SUBCUTANEOUS | 0 refills | Status: DC

## 2023-06-10 MED ORDER — GABAPENTIN 300 MG PO CAPS
300.0000 mg | ORAL_CAPSULE | ORAL | Status: AC
  Administered 2023-06-11: 300 mg via ORAL

## 2023-06-10 MED ORDER — CHLORHEXIDINE GLUCONATE 0.12 % MT SOLN
15.0000 mL | Freq: Once | OROMUCOSAL | Status: AC
  Administered 2023-06-11: 15 mL via OROMUCOSAL

## 2023-06-10 MED ORDER — TRANEXAMIC ACID-NACL 1000-0.7 MG/100ML-% IV SOLN
1000.0000 mg | Freq: Once | INTRAVENOUS | Status: AC
  Administered 2023-06-11: 1000 mg via INTRAVENOUS

## 2023-06-10 MED ORDER — SODIUM CHLORIDE 0.9 % IV SOLN: INTRAVENOUS | Status: DC

## 2023-06-10 MED ORDER — ORAL CARE MOUTH RINSE: 15.0000 mL | Freq: Once | OROMUCOSAL | Status: AC

## 2023-06-10 MED ORDER — POVIDONE-IODINE 10 % EX SWAB
2.0000 | Freq: Once | CUTANEOUS | Status: AC
  Administered 2023-06-11: 2 via TOPICAL

## 2023-06-10 MED ORDER — SODIUM CHLORIDE 0.9 % IV SOLN
2.0000 g | INTRAVENOUS | Status: AC
  Administered 2023-06-11: 2 g via INTRAVENOUS
  Filled 2023-06-10 (×3): qty 2

## 2023-06-10 MED ORDER — ACETAMINOPHEN 500 MG PO TABS
1000.0000 mg | ORAL_TABLET | ORAL | Status: AC
  Administered 2023-06-11: 1000 mg via ORAL

## 2023-06-10 MED ORDER — CELECOXIB 200 MG PO CAPS
400.0000 mg | ORAL_CAPSULE | ORAL | Status: AC
  Administered 2023-06-11: 400 mg via ORAL

## 2023-06-11 ENCOUNTER — Ambulatory Visit: Payer: Managed Care, Other (non HMO) | Admitting: Certified Registered"

## 2023-06-11 ENCOUNTER — Other Ambulatory Visit: Payer: Self-pay

## 2023-06-11 ENCOUNTER — Ambulatory Visit
Admission: RE | Admit: 2023-06-11 | Discharge: 2023-06-11 | Disposition: A | Discharge status: Home / Self Care | Payer: Managed Care, Other (non HMO) | Attending: Obstetrics and Gynecology | Admitting: Obstetrics and Gynecology

## 2023-06-11 ENCOUNTER — Encounter
Admission: RE | Disposition: A | Discharge status: Home / Self Care | Payer: Self-pay | Source: Home / Self Care | Attending: Obstetrics and Gynecology

## 2023-06-11 ENCOUNTER — Encounter: Payer: Self-pay | Admitting: Obstetrics and Gynecology

## 2023-06-11 DIAGNOSIS — F419 Anxiety disorder, unspecified: Secondary | ICD-10-CM | POA: Diagnosis not present

## 2023-06-11 DIAGNOSIS — F319 Bipolar disorder, unspecified: Secondary | ICD-10-CM | POA: Insufficient documentation

## 2023-06-11 DIAGNOSIS — Z975 Presence of (intrauterine) contraceptive device: Secondary | ICD-10-CM

## 2023-06-11 DIAGNOSIS — F909 Attention-deficit hyperactivity disorder, unspecified type: Secondary | ICD-10-CM | POA: Diagnosis not present

## 2023-06-11 DIAGNOSIS — K219 Gastro-esophageal reflux disease without esophagitis: Secondary | ICD-10-CM | POA: Diagnosis not present

## 2023-06-11 DIAGNOSIS — N941 Unspecified dyspareunia: Secondary | ICD-10-CM | POA: Diagnosis present

## 2023-06-11 DIAGNOSIS — F17210 Nicotine dependence, cigarettes, uncomplicated: Secondary | ICD-10-CM | POA: Insufficient documentation

## 2023-06-11 DIAGNOSIS — Z01818 Encounter for other preprocedural examination: Secondary | ICD-10-CM

## 2023-06-11 DIAGNOSIS — R102 Pelvic and perineal pain: Secondary | ICD-10-CM | POA: Diagnosis not present

## 2023-06-11 DIAGNOSIS — F32A Depression, unspecified: Secondary | ICD-10-CM | POA: Diagnosis not present

## 2023-06-11 DIAGNOSIS — N838 Other noninflammatory disorders of ovary, fallopian tube and broad ligament: Secondary | ICD-10-CM | POA: Diagnosis not present

## 2023-06-11 DIAGNOSIS — N83202 Unspecified ovarian cyst, left side: Secondary | ICD-10-CM | POA: Diagnosis not present

## 2023-06-11 DIAGNOSIS — N8003 Adenomyosis of the uterus: Secondary | ICD-10-CM | POA: Insufficient documentation

## 2023-06-11 DIAGNOSIS — E119 Type 2 diabetes mellitus without complications: Secondary | ICD-10-CM

## 2023-06-11 DIAGNOSIS — N888 Other specified noninflammatory disorders of cervix uteri: Secondary | ICD-10-CM | POA: Insufficient documentation

## 2023-06-11 DIAGNOSIS — Z90710 Acquired absence of both cervix and uterus: Secondary | ICD-10-CM

## 2023-06-11 DIAGNOSIS — I10 Essential (primary) hypertension: Secondary | ICD-10-CM | POA: Insufficient documentation

## 2023-06-11 DIAGNOSIS — Z30432 Encounter for removal of intrauterine contraceptive device: Secondary | ICD-10-CM | POA: Insufficient documentation

## 2023-06-11 DIAGNOSIS — N946 Dysmenorrhea, unspecified: Secondary | ICD-10-CM

## 2023-06-11 HISTORY — PX: IUD REMOVAL: SHX5392

## 2023-06-11 HISTORY — PX: ROBOTIC ASSISTED TOTAL HYSTERECTOMY WITH BILATERAL SALPINGO OOPHERECTOMY: SHX6086

## 2023-06-11 LAB — GLUCOSE, CAPILLARY
Glucose-Capillary: 122 mg/dL — ABNORMAL HIGH (ref 70–99)
Glucose-Capillary: 188 mg/dL — ABNORMAL HIGH (ref 70–99)

## 2023-06-11 LAB — ABO/RH: ABO/RH(D): O POS

## 2023-06-11 LAB — POCT PREGNANCY, URINE: Preg Test, Ur: NEGATIVE

## 2023-06-11 SURGERY — HYSTERECTOMY, TOTAL, ROBOT-ASSISTED, LAPAROSCOPIC, WITH BILATERAL SALPINGO-OOPHORECTOMY
Anesthesia: General

## 2023-06-11 MED ORDER — LACTATED RINGERS IV SOLN: INTRAVENOUS | Status: DC

## 2023-06-11 MED ORDER — GABAPENTIN 300 MG PO CAPS
ORAL_CAPSULE | ORAL | Status: AC
  Filled 2023-06-11: qty 1

## 2023-06-11 MED ORDER — FENTANYL CITRATE (PF) 100 MCG/2ML IJ SOLN
INTRAMUSCULAR | Status: AC
  Filled 2023-06-11: qty 2

## 2023-06-11 MED ORDER — ESMOLOL HCL 100 MG/10ML IV SOLN
INTRAVENOUS | Status: DC | PRN
Start: 2023-06-11 — End: 2023-06-11
  Administered 2023-06-11: 20 mg via INTRAVENOUS

## 2023-06-11 MED ORDER — SODIUM CHLORIDE 0.9 % IV SOLN
2.0000 g | Freq: Once | INTRAVENOUS | Status: AC
  Administered 2023-06-11: 2 g via INTRAVENOUS

## 2023-06-11 MED ORDER — SIMETHICONE 180 MG PO CAPS: 1.0000 | ORAL_CAPSULE | Freq: Four times a day (QID) | ORAL | 1 refills | Status: DC | PRN

## 2023-06-11 MED ORDER — PROPOFOL 10 MG/ML IV BOLUS
INTRAVENOUS | Status: DC | PRN
  Administered 2023-06-11: 200 mg via INTRAVENOUS

## 2023-06-11 MED ORDER — TRANEXAMIC ACID-NACL 1000-0.7 MG/100ML-% IV SOLN
INTRAVENOUS | Status: AC
  Filled 2023-06-11: qty 100

## 2023-06-11 MED ORDER — MIDAZOLAM HCL 2 MG/2ML IJ SOLN
INTRAMUSCULAR | Status: DC | PRN
  Administered 2023-06-11 (×2): 1 mg via INTRAVENOUS

## 2023-06-11 MED ORDER — MIDAZOLAM HCL 2 MG/2ML IJ SOLN
INTRAMUSCULAR | Status: AC
  Filled 2023-06-11: qty 2

## 2023-06-11 MED ORDER — FENTANYL CITRATE (PF) 100 MCG/2ML IJ SOLN: 25.0000 ug | INTRAMUSCULAR | Status: DC | PRN

## 2023-06-11 MED ORDER — KETOROLAC TROMETHAMINE 30 MG/ML IJ SOLN
30.0000 mg | Freq: Once | INTRAMUSCULAR | Status: AC
  Administered 2023-06-11: 30 mg via INTRAVENOUS

## 2023-06-11 MED ORDER — OXYCODONE HCL 5 MG PO TABS
5.0000 mg | ORAL_TABLET | Freq: Once | ORAL | Status: AC
  Administered 2023-06-11: 5 mg via ORAL

## 2023-06-11 MED ORDER — DEXAMETHASONE SODIUM PHOSPHATE 10 MG/ML IJ SOLN
INTRAMUSCULAR | Status: DC | PRN
  Administered 2023-06-11: 10 mg via INTRAVENOUS

## 2023-06-11 MED ORDER — BUPIVACAINE HCL (PF) 0.5 % IJ SOLN
INTRAMUSCULAR | Status: AC
  Filled 2023-06-11: qty 30

## 2023-06-11 MED ORDER — ONDANSETRON HCL 4 MG/2ML IJ SOLN
INTRAMUSCULAR | Status: DC | PRN
  Administered 2023-06-11: 8 mg via INTRAVENOUS

## 2023-06-11 MED ORDER — HYDROMORPHONE HCL 1 MG/ML IJ SOLN
INTRAMUSCULAR | Status: DC | PRN
  Administered 2023-06-11 (×2): .5 mg via INTRAVENOUS

## 2023-06-11 MED ORDER — 0.9 % SODIUM CHLORIDE (POUR BTL) OPTIME
TOPICAL | Status: DC | PRN
  Administered 2023-06-11: 500 mL

## 2023-06-11 MED ORDER — ACETAMINOPHEN 500 MG PO TABS
ORAL_TABLET | ORAL | Status: AC
  Filled 2023-06-11: qty 2

## 2023-06-11 MED ORDER — SODIUM CHLORIDE 0.9 % IR SOLN
Status: DC | PRN
  Administered 2023-06-11: 1000 mL

## 2023-06-11 MED ORDER — DOCUSATE SODIUM 100 MG PO CAPS: 100.0000 mg | ORAL_CAPSULE | Freq: Two times a day (BID) | ORAL | 2 refills | Status: DC | PRN

## 2023-06-11 MED ORDER — OXYCODONE HCL 5 MG PO TABS
ORAL_TABLET | ORAL | Status: AC
  Filled 2023-06-11: qty 1

## 2023-06-11 MED ORDER — OXYCODONE-ACETAMINOPHEN 5-325 MG PO TABS
1.0000 | ORAL_TABLET | Freq: Four times a day (QID) | ORAL | 0 refills | Status: DC | PRN
Start: 2023-06-11 — End: 2023-07-24

## 2023-06-11 MED ORDER — LIDOCAINE HCL (CARDIAC) PF 100 MG/5ML IV SOSY
PREFILLED_SYRINGE | INTRAVENOUS | Status: DC | PRN
  Administered 2023-06-11: 100 mg via INTRAVENOUS

## 2023-06-11 MED ORDER — ROCURONIUM BROMIDE 100 MG/10ML IV SOLN
INTRAVENOUS | Status: DC | PRN
  Administered 2023-06-11: 50 mg via INTRAVENOUS
  Administered 2023-06-11 (×3): 20 mg via INTRAVENOUS
  Administered 2023-06-11: 10 mg via INTRAVENOUS

## 2023-06-11 MED ORDER — CHLORHEXIDINE GLUCONATE 0.12 % MT SOLN
OROMUCOSAL | Status: AC
  Filled 2023-06-11: qty 15

## 2023-06-11 MED ORDER — DROPERIDOL 2.5 MG/ML IJ SOLN: 0.6250 mg | Freq: Once | INTRAMUSCULAR | Status: DC | PRN

## 2023-06-11 MED ORDER — PROPOFOL 10 MG/ML IV BOLUS
INTRAVENOUS | Status: AC
  Filled 2023-06-11: qty 20

## 2023-06-11 MED ORDER — SUGAMMADEX SODIUM 200 MG/2ML IV SOLN
INTRAVENOUS | Status: DC | PRN
  Administered 2023-06-11: 400 mg via INTRAVENOUS

## 2023-06-11 MED ORDER — CELECOXIB 200 MG PO CAPS
ORAL_CAPSULE | ORAL | Status: AC
  Filled 2023-06-11: qty 2

## 2023-06-11 MED ORDER — HYDROMORPHONE HCL 1 MG/ML IJ SOLN
INTRAMUSCULAR | Status: AC
  Filled 2023-06-11: qty 1

## 2023-06-11 MED ORDER — BUPIVACAINE HCL 0.5 % IJ SOLN
INTRAMUSCULAR | Status: DC | PRN
  Administered 2023-06-11: 18 mL

## 2023-06-11 MED ORDER — HEMOSTATIC AGENTS (NO CHARGE) OPTIME
TOPICAL | Status: DC | PRN
  Administered 2023-06-11: 1 via TOPICAL

## 2023-06-11 MED ORDER — FENTANYL CITRATE (PF) 100 MCG/2ML IJ SOLN
INTRAMUSCULAR | Status: DC | PRN
  Administered 2023-06-11: 50 ug via INTRAVENOUS
  Administered 2023-06-11: 100 ug via INTRAVENOUS
  Administered 2023-06-11: 50 ug via INTRAVENOUS

## 2023-06-11 MED ORDER — KETOROLAC TROMETHAMINE 30 MG/ML IJ SOLN
INTRAMUSCULAR | Status: AC
  Filled 2023-06-11: qty 1

## 2023-06-11 SURGICAL SUPPLY — 81 items
ADH SKN CLS APL DERMABOND .7 (GAUZE/BANDAGES/DRESSINGS) ×1
BAG DRN RND TRDRP ANRFLXCHMBR (UROLOGICAL SUPPLIES) ×1
BAG URINE DRAIN 2000ML AR STRL (UROLOGICAL SUPPLIES) ×1 IMPLANT
BLADE SURG SZ11 CARB STEEL (BLADE) ×1 IMPLANT
CANNULA CAP OBTURATR AIRSEAL 8 (CAP) ×1 IMPLANT
CATH FOLEY 2WAY 5CC 16FR (CATHETERS) ×2
CATH URTH 16FR FL 2W BLN LF (CATHETERS) ×1 IMPLANT
COVER TIP SHEARS 8 DVNC (MISCELLANEOUS) ×1 IMPLANT
COVER WAND RF STERILE (DRAPES) IMPLANT
CUP MEDICINE 2OZ PLAST GRAD ST (MISCELLANEOUS) ×1 IMPLANT
DERMABOND ADVANCED .7 DNX12 (GAUZE/BANDAGES/DRESSINGS) ×1 IMPLANT
DRAPE ARM DVNC X/XI (DISPOSABLE) ×4 IMPLANT
DRAPE COLUMN DVNC XI (DISPOSABLE) ×1 IMPLANT
DRAPE ROBOT W/ LEGGING 30X125 (DRAPES) ×1 IMPLANT
DRAPE UNDER BUTTOCK W/FLU (DRAPES) ×1 IMPLANT
DRESSING SURGICEL FIBRLLR 1X2 (HEMOSTASIS) IMPLANT
DRIVER NDL LRG 8 DVNC XI (INSTRUMENTS) ×1 IMPLANT
DRIVER NDLE LRG 8 DVNC XI (INSTRUMENTS) ×1
DRSG SURGICEL FIBRILLAR 1X2 (HEMOSTASIS) ×1
DRSG TELFA 3X8 NADH STRL (GAUZE/BANDAGES/DRESSINGS) ×1 IMPLANT
ELECT REM PT RETURN 9FT ADLT (ELECTROSURGICAL) ×1
ELECTRODE REM PT RTRN 9FT ADLT (ELECTROSURGICAL) ×1 IMPLANT
FORCEPS BPLR FENES DVNC XI (FORCEP) ×1 IMPLANT
FORCEPS BPLR R/ABLATION 8 DVNC (INSTRUMENTS) ×1 IMPLANT
GAUZE 4X4 16PLY ~~LOC~~+RFID DBL (SPONGE) ×1 IMPLANT
GLOVE BIO SURGEON STRL SZ 6.5 (GLOVE) ×2 IMPLANT
GLOVE INDICATOR 7.0 STRL GRN (GLOVE) ×3 IMPLANT
GLOVE PI ORTHO PRO STRL 7.5 (GLOVE) IMPLANT
GOWN STRL REUS W/ TWL LRG LVL3 (GOWN DISPOSABLE) ×3 IMPLANT
GOWN STRL REUS W/TWL LRG LVL3 (GOWN DISPOSABLE) ×3
GRASPER SUT TROCAR 14GX15 (MISCELLANEOUS) ×1 IMPLANT
IRRIGATION STRYKERFLOW (MISCELLANEOUS) IMPLANT
IRRIGATOR STRYKERFLOW (MISCELLANEOUS) ×1
IV NS 1000ML (IV SOLUTION) ×1
IV NS 1000ML BAXH (IV SOLUTION) IMPLANT
KIT PINK PAD W/HEAD ARE REST (MISCELLANEOUS) ×1
KIT PINK PAD W/HEAD ARM REST (MISCELLANEOUS) ×1 IMPLANT
KIT TURNOVER CYSTO (KITS) ×1 IMPLANT
LABEL OR SOLS (LABEL) ×1 IMPLANT
MANIFOLD NEPTUNE II (INSTRUMENTS) ×1 IMPLANT
MANIPULATOR VCARE LG CRV RETR (MISCELLANEOUS) IMPLANT
MANIPULATOR VCARE SML CRV RETR (MISCELLANEOUS) IMPLANT
MANIPULATOR VCARE STD CRV RETR (MISCELLANEOUS) IMPLANT
NEEDLE VERESS 14GA 120MM (NEEDLE) ×1 IMPLANT
NS IRRIG 1000ML POUR BTL (IV SOLUTION) ×1 IMPLANT
NS IRRIG 500ML POUR BTL (IV SOLUTION) IMPLANT
OBTURATOR OPTICAL STND 8 DVNC (TROCAR) ×1
OBTURATOR OPTICALSTD 8 DVNC (TROCAR) ×1 IMPLANT
OCCLUDER COLPOPNEUMO (BALLOONS) ×1 IMPLANT
PACK DNC HYST (MISCELLANEOUS) ×1 IMPLANT
PACK GYN LAPAROSCOPIC (MISCELLANEOUS) ×1 IMPLANT
PAD OB MATERNITY 4.3X12.25 (PERSONAL CARE ITEMS) ×1 IMPLANT
PAD PREP OB/GYN DISP 24X41 (PERSONAL CARE ITEMS) ×1 IMPLANT
PORT ACCESS TROCAR AIRSEAL 12 (TROCAR) IMPLANT
PORT ACCESS TROCAR AIRSEAL 5 (TROCAR) IMPLANT
SCISSORS MNPLR CVD DVNC XI (INSTRUMENTS) ×1 IMPLANT
SCRUB CHG 4% DYNA-HEX 4OZ (MISCELLANEOUS) ×1 IMPLANT
SEAL UNIV 5-12 XI (MISCELLANEOUS) ×3 IMPLANT
SEALER VESSEL EXT DVNC XI (MISCELLANEOUS) IMPLANT
SET CYSTO W/LG BORE CLAMP LF (SET/KITS/TRAYS/PACK) IMPLANT
SET TRI-LUMEN FLTR TB AIRSEAL (TUBING) IMPLANT
SET TUBE FILTERED XL AIRSEAL (SET/KITS/TRAYS/PACK) IMPLANT
SOL ELECTROSURG ANTI STICK (MISCELLANEOUS) ×1
SOL PREP PVP 2OZ (MISCELLANEOUS) ×1
SOLUTION ELECTROSURG ANTI STCK (MISCELLANEOUS) ×1 IMPLANT
SOLUTION PREP PVP 2OZ (MISCELLANEOUS) ×1 IMPLANT
SURGILUBE 2OZ TUBE FLIPTOP (MISCELLANEOUS) ×1 IMPLANT
SUT DVC VLOC 180 0 12IN GS21 (SUTURE) ×1
SUT MNCRL 4-0 (SUTURE) ×2
SUT MNCRL 4-0 27XMFL (SUTURE) ×2
SUT VIC AB 0 CT2 27 (SUTURE) IMPLANT
SUT VIC AB 2-0 CT1 27 (SUTURE) ×1
SUT VIC AB 2-0 CT1 TAPERPNT 27 (SUTURE) ×1 IMPLANT
SUT VICRYL 0 UR6 27IN ABS (SUTURE) ×1 IMPLANT
SUTURE DVC VLC 180 0 12IN GS21 (SUTURE) ×1 IMPLANT
SUTURE MNCRL 4-0 27XMF (SUTURE) ×1 IMPLANT
SYR 10ML LL (SYRINGE) ×1 IMPLANT
SYR 50ML LL SCALE MARK (SYRINGE) ×1 IMPLANT
TOWEL OR 17X26 4PK STRL BLUE (TOWEL DISPOSABLE) ×1 IMPLANT
TRAP FLUID SMOKE EVACUATOR (MISCELLANEOUS) ×1 IMPLANT
WATER STERILE IRR 500ML POUR (IV SOLUTION) ×1 IMPLANT

## 2023-06-11 NOTE — Discharge Instructions (Signed)

## 2023-06-11 NOTE — Transfer of Care (Signed)
Immediate Anesthesia Transfer of Care Note  Patient: Amanda Nash  Procedure(s) Performed: ROBOTIC TOTAL LAPAROSCOPIC HYSTERECTOMY WITH BILATERAL SALPINGECTOMY INTRAUTERINE DEVICE (IUD) REMOVAL  Patient Location: PACU  Anesthesia Type:General  Level of Consciousness: awake, alert , and oriented  Airway & Oxygen Therapy: Patient Spontanous Breathing and Patient connected to face mask oxygen  Post-op Assessment: Report given to RN and Post -op Vital signs reviewed and stable  Post vital signs: Reviewed and stable  Last Vitals:  Vitals Value Taken Time  BP 116/58 06/11/23 1113  Temp 36.8 C 06/11/23 1113  Pulse 71 06/11/23 1114  Resp 16 06/11/23 1113  SpO2 100 % 06/11/23 1114  Vitals shown include unfiled device data.  Last Pain:  Vitals:   06/11/23 0624  TempSrc: Oral  PainSc: 0-No pain         Complications: No notable events documented.

## 2023-06-11 NOTE — Anesthesia Procedure Notes (Signed)
Anesthesia Procedure Note     

## 2023-06-11 NOTE — Anesthesia Preprocedure Evaluation (Signed)
Anesthesia Evaluation  Patient identified by MRN, date of birth, ID band Patient awake    Reviewed: Allergy & Precautions, H&P , NPO status , Patient's Chart, lab work & pertinent test results, reviewed documented beta blocker date and time   History of Anesthesia Complications Negative for: history of anesthetic complications  Airway Mallampati: I  TM Distance: >3 FB Neck ROM: full    Dental  (+) Dental Advidsory Given, Teeth Intact   Pulmonary neg shortness of breath, sleep apnea and Continuous Positive Airway Pressure Ventilation , neg COPD, neg recent URI, Current Smoker and Patient abstained from smoking.   Pulmonary exam normal breath sounds clear to auscultation       Cardiovascular Exercise Tolerance: Good hypertension, (-) angina (-) Past MI and (-) Cardiac Stents Normal cardiovascular exam(-) dysrhythmias (-) Valvular Problems/Murmurs Rhythm:regular Rate:Normal     Neuro/Psych  Headaches, neg Seizures PSYCHIATRIC DISORDERS Anxiety Depression Bipolar Disorder    Neuromuscular disease    GI/Hepatic Neg liver ROS,GERD  ,,  Endo/Other  diabetes  Morbid obesity  Renal/GU negative Renal ROS  negative genitourinary   Musculoskeletal   Abdominal   Peds  Hematology negative hematology ROS (+)   Anesthesia Other Findings Past Medical History: No date: ADHD No date: Anemia No date: Anxiety 2016: Bipolar 1 disorder (HCC) No date: Borderline personality disorder (HCC) No date: Depression No date: Diabetes mellitus without complication (HCC) 02/13/2022: Epistaxis, recurrent No date: GERD (gastroesophageal reflux disease) No date: Headache No date: HLD (hyperlipidemia) No date: Hypertension No date: Lumbar degenerative disc disease No date: Segmental and somatic dysfunction of cervical region No date: Sleep apnea No date: Thrombocytosis   Reproductive/Obstetrics negative OB ROS                              Anesthesia Physical Anesthesia Plan  ASA: 3  Anesthesia Plan: General   Post-op Pain Management:    Induction: Intravenous  PONV Risk Score and Plan: 2 and Ondansetron, Dexamethasone, Midazolam and Treatment may vary due to age or medical condition  Airway Management Planned: Oral ETT  Additional Equipment:   Intra-op Plan:   Post-operative Plan: Extubation in OR  Informed Consent: I have reviewed the patients History and Physical, chart, labs and discussed the procedure including the risks, benefits and alternatives for the proposed anesthesia with the patient or authorized representative who has indicated his/her understanding and acceptance.     Dental Advisory Given  Plan Discussed with: Anesthesiologist, CRNA and Surgeon  Anesthesia Plan Comments:        Anesthesia Quick Evaluation

## 2023-06-11 NOTE — Interval H&P Note (Signed)
History and Physical Interval Note:  06/11/2023 7:28 AM  Amanda Nash  has presented today for surgery, with the diagnosis of Pelvic pain, Dyspareunia, History of abnormal uterine bleeding, IUD in place.  The various methods of treatment have been discussed with the patient and family. After consideration of risks, benefits and other options for treatment, the patient has consented to  Procedure(s): ROBOTIC TOTAL LAPAROSCOPIC HYSTERECTOMY WITH BILATERAL SALPINGECTOMY (N/A),INTRAUTERINE DEVICE (IUD) REMOVAL (N/A) as a surgical intervention.  The patient's history has been reviewed, patient examined, no change in status, stable for surgery.  I have reviewed the patient's chart and labs.  Endometrial biopsy performed in office recently negative. Questions were answered to the patient's satisfaction.    Lab Results  Component Value Date   WBC 7.8 06/06/2023   HGB 13.8 06/06/2023   HCT 41.6 06/06/2023   MCV 82.2 06/06/2023   PLT 359 06/06/2023     Hildred Laser, MD [facility]

## 2023-06-11 NOTE — Anesthesia Postprocedure Evaluation (Signed)
Anesthesia Post Note  Patient: Amanda Nash  Procedure(s) Performed: ROBOTIC TOTAL LAPAROSCOPIC HYSTERECTOMY WITH BILATERAL SALPINGECTOMY INTRAUTERINE DEVICE (IUD) REMOVAL  Patient location during evaluation: PACU Anesthesia Type: General Level of consciousness: awake and alert Pain management: pain level controlled Vital Signs Assessment: post-procedure vital signs reviewed and stable Respiratory status: spontaneous breathing, nonlabored ventilation, respiratory function stable and patient connected to nasal cannula oxygen Cardiovascular status: blood pressure returned to baseline and stable Postop Assessment: no apparent nausea or vomiting Anesthetic complications: no  No notable events documented.   Last Vitals:  Vitals:   06/11/23 1200 06/11/23 1230  BP: (!) 147/90 139/82  Pulse: 70   Resp: 20 18  Temp: 37.2 C 37.2 C  SpO2: 97% 98%    Last Pain:  Vitals:   06/11/23 1230  TempSrc:   PainSc: 4                  Stephanie Coup

## 2023-06-11 NOTE — Anesthesia Procedure Notes (Signed)
Procedure Name: Intubation Date/Time: 06/11/2023 7:48 AM  Performed by: Genia Del, CRNAPre-anesthesia Checklist: Patient identified, Patient being monitored, Timeout performed, Emergency Drugs available and Suction available Patient Re-evaluated:Patient Re-evaluated prior to induction Oxygen Delivery Method: Circle system utilized Preoxygenation: Pre-oxygenation with 100% oxygen Induction Type: IV induction Ventilation: Mask ventilation without difficulty and Oral airway inserted - appropriate to patient size Laryngoscope Size: McGraph and 4 Grade View: Grade I Tube type: Oral Tube size: 7.0 mm Number of attempts: 1 Airway Equipment and Method: Stylet Placement Confirmation: ETT inserted through vocal cords under direct vision, positive ETCO2 and breath sounds checked- equal and bilateral Secured at: 22 cm Tube secured with: Tape Dental Injury: Teeth and Oropharynx as per pre-operative assessment  Comments: Eyes taped closed prior to DL after pt unconscious.  Small breaths given using oral aw.  Pt off GLP agonist x 7 days since last Sunday).  Easy atraumatic grade 1 intubation.   ETT secured well with X double pink tape and 1 in. Plastic ttape over this.   18 Fr. OGT placed after intubation.  Pink foam donut pillow threaded over ETT to protect pt's face.

## 2023-06-11 NOTE — Progress Notes (Signed)
Upon arrival to post-op, patient voided 20ml of pink, clear urine. Bladder scan preformed,  86mL urine in bladder.  MD notified.  Okay to discharge home, per MD

## 2023-06-11 NOTE — Op Note (Signed)
Procedure(s): ROBOTIC TOTAL LAPAROSCOPIC HYSTERECTOMY WITH BILATERAL SALPINGECTOMY INTRAUTERINE DEVICE (IUD) REMOVAL Procedure Note  Amanda Nash female 37 y.o. 06/11/2023  Indications: The patient is a 37 y.o. [id number] female with   Pre-operative Diagnosis: Pelvic pain, dyspareunia, history of abnormal uterine bleeding, IUD in place, left ovarian cyst.   Post-operative Diagnosis: Same  Surgeon: Hildred Laser, MD  Assistants:  Waymon Amato, RNFA.    Anesthesia: General endotracheal anesthesia  Findings: The uterus was sounded to 8.5 cm IUD threads visualized, ~ 3 cm in length from external cervical os.  Fallopian tubes and ovaries appeared normal.  Small simple cyst on left ovary.   Procedure Details: The patient was seen in the Holding Room. The risks, benefits, complications, treatment options, and expected outcomes were discussed with the patient.  The patient concurred with the proposed plan, giving informed consent.  The site of surgery properly noted/marked. The patient was taken to the Operating Room, identified as Amanda Nash and the procedure verified as Procedure(s) (LRB): ROBOTIC TOTAL LAPAROSCOPIC HYSTERECTOMY WITH BILATERAL SALPINGECTOMY (N/A) INTRAUTERINE DEVICE (IUD) REMOVAL (N/A). A Time Out was held and the above information confirmed.   After induction of anesthesia, the patient was prepped and draped in the usual sterile manner. Pt was placed in dorsal lithotomy position after anesthesia and draped and prepped in the usual sterile manner. Foley catheter was placed.  A sterile speculum was placed into the vagina.  The IUD threads were identified and grasped using a ring forceps, with removal of the IUD intact. The cervix was then grasped with a single-tooth tenaculum and the uterus was sounded to 8.5 cm. A medium V-care device was then properly placed.   Attention was then turned to the abdomen, where  an 8 mm incision was made in the umbilicus.  The  Veress needle was passed and a pneumoperitoneum was established.  The Veress needle was then removed and an 8 mm port was placed in the umbilicus.  The daVinci camera was then placed supraumbilically. Three more ports were then placed. There were two 8 mm ports that were placed 10 cm laterally to the umbilicus and 2 cm inferiorly on either side.  The 5 mm assistant port was then placed in the right lower quadrant 2 cm medial and superiorr to the iliac crest. All incisions were injected with local anesthetic (Sensorcaine 0.5%, total of 18 cc) prior to port placement. The daVinci robot was then docked in the normal fashion. The patient was placed in steep Trendelenburg positioning.  Inspection of the pelvis showed a normal uterus, ovaries, and tubes. Small simple cyst was noted on the left ovary, this was ruptured using the monopolar scissors and allowed to drain.  The right mesosalpinx of the fallopian tube was cauterized and cut using the vessel sealer device.   The utero-ovarian ligament was also coagulated and cut. The round ligament was coagulated and cut. A bladder flap was created and the bladder was dissected down from the cervix.This entire procedure was then repeated on the left side.  The uterine arteries were then skeletonized, and cauterized using the vessel sealer device.  The top V-care cup was then identified and an incision was made in the cervicovaginal junction on top of the vaginal cuff. This was also repeated posteriorly. The incision was extended laterally, freeing the uterus from the surrounding vagina. The uterus was then delivered posteriorly through the vagina using the robotic assistant. A vaginal  balloon occluder was then placed inside the vagina to help with  pneumoperitoneum.     The vaginal cuff was closed with a running suture of 0 Vicryl V-lock. The ureters were identified bilaterally. There was a small amount of oozing noted along the posterior peritoneum beneath the vaginal cuff  repair on the left, fibrillar was placed for hemostasis due to location being close to the rectum. Insufflation pressure was then decreased to assess for any further bleeding. No other bleeding was encountered.  The instruments were then removed from the abdomen and the robot was undocked.    All port sites were closed 4-0 Monocryl skin closure using subcuticular stitches. Dermabond was placed over all incisions.  The final needle, sponge, and instrument count was correct. The patient tolerated the procedure well. Patient to the recovery room in good condition.      Estimated Blood Loss:  50 ml      Drains: straight catheterization prior to procedure with  1800 ml of clear urine         Total IV Fluids: 900 ml  Specimens: Uterus with cervix, bilateral fallopian tubes         Implants: None         Complications:  None; patient tolerated the procedure well.         Disposition: PACU - hemodynamically stable.         Condition: stable   Hildred Laser, MD [facility]

## 2023-06-13 LAB — SURGICAL PATHOLOGY

## 2023-06-19 NOTE — Progress Notes (Unsigned)
    OBSTETRICS/GYNECOLOGY POST-OPERATIVE CLINIC VISIT  Subjective:     Amanda Nash is a 37 y.o. female who presents to the clinic 1 weeks status post ROBOTIC TOTAL LAPAROSCOPIC HYSTERECTOMY WITH BILATERAL SALPINGECTOMY  INTRAUTERINE DEVICE (IUD) REMOVAL  for  Pelvic pain, Dyspareunia, History of abnormal uterine bleeding, IUD in place . Eating a regular diet {with-without:5700} difficulty. Bowel movements are {normal/abnormal***:19619}. {pain control:13522::"The patient is not having any pain."}  {Common ambulatory SmartLinks:19316}  Review of Systems {ros; complete:30496}   Objective:   LMP 04/12/2023  There is no height or weight on file to calculate BMI.  General:  alert and no distress  Abdomen: soft, bowel sounds active, non-tender  Incision:   {incision:13716::"no dehiscence","incision well approximated","healing well","no drainage","no erythema","no hernia","no seroma","no swelling"}    Pathology:     Assessment:   Patient s/p ROBOTIC TOTAL LAPAROSCOPIC HYSTERECTOMY WITH BILATERAL SALPINGECTOMY  INTRAUTERINE DEVICE (IUD) REMOVAL (surgery)  {doing well:13525::"Doing well postoperatively."}   Plan:   1. Continue any current medications as instructed by provider. 2. Wound care discussed. 3. Operative findings again reviewed. Pathology report discussed. 4. Activity restrictions: {restrictions:13723} 5. Anticipated return to work: {work return:14002}. 6. Follow up: 5 weeks for 6 week post op.    Hildred Laser, MD [facility]

## 2023-06-20 ENCOUNTER — Ambulatory Visit: Payer: Managed Care, Other (non HMO) | Admitting: Obstetrics and Gynecology

## 2023-06-20 ENCOUNTER — Encounter: Payer: Self-pay | Admitting: Obstetrics and Gynecology

## 2023-06-20 VITALS — BP 116/60 | HR 66 | Wt 271.4 lb

## 2023-06-20 DIAGNOSIS — Z4889 Encounter for other specified surgical aftercare: Secondary | ICD-10-CM

## 2023-06-20 DIAGNOSIS — Z90710 Acquired absence of both cervix and uterus: Secondary | ICD-10-CM

## 2023-06-26 ENCOUNTER — Encounter: Payer: Self-pay | Admitting: Obstetrics and Gynecology

## 2023-07-23 NOTE — Progress Notes (Unsigned)
OBSTETRICS/GYNECOLOGY POST-OPERATIVE CLINIC VISIT  Subjective:     Amanda Nash is a 37 y.o. female who presents to the clinic 6 weeks status post ROBOTIC TOTAL LAPAROSCOPIC HYSTERECTOMY WITH BILATERAL SALPINGECTOMY NTRAUTERINE DEVICE (IUD) REMOVAL for  Pelvic pain, Dyspareunia, History of abnormal uterine bleeding, IUD in place . Eating a regular diet {with-without:5700} difficulty. Bowel movements are {normal/abnormal***:19619}. {pain control:13522::"The patient is not having any pain."}  {Common ambulatory SmartLinks:19316}  Review of Systems {ros; complete:30496}   Objective:   There were no vitals taken for this visit. There is no height or weight on file to calculate BMI.  General:  alert and no distress  Abdomen: soft, bowel sounds active, non-tender  Incision:   {incision:13716::"no dehiscence","incision well approximated","healing well","no drainage","no erythema","no hernia","no seroma","no swelling"}    Pathology:   REPORT OF SURGICAL PATHOLOGY  Accession #: [id number] Patient Name: Amanda Nash, Amanda Nash Visit # : [id number]  MRN: [id number] Physician: Hildred Laser DOB/Age May 23, 1986 (Age: 31) Gender: F Collected Date: 06/11/2023 Received Date: 06/11/2023  FINAL DIAGNOSIS       1. Uterus, cervix and bilateral fallopian tubes,  :      - CERVIX: BENIGN, NABOTHIAN CYST      - ENDOMETRIUM: BENIGN, NO HYPERPLASIA OR MALIGNANCY      - MYOMETRIUM: ADENOMYOSIS      - BILATERAL FALLOPIAN TUBES: BENIGN, PARATUBAL CYSTS  DATE SIGNED OUT: 06/13/2023 ELECTRONIC SIGNATURE : [place] Callas M.D., Nupur, Pathologist, Electronic Signature  MICROSCOPIC DESCRIPTION  CASE COMMENTS STAINS USED IN DIAGNOSIS: H&E H&E H&E H&E H&E H&E H&E H&E  CLINICAL HISTORY  SPECIMEN(S) OBTAINED 1. Uterus, cervix and bilateral fallopian tubes,  SPECIMEN COMMENTS: SPECIMEN CLINICAL INFORMATION: 1. Pelvic pain, dyspareunia.  History of abnormal uterine bleeding.  IUD in place  Gross  Description 1. Specimen: Total hysterectomy with bilateral salpingectomy.      Specimen integrity (intact/incised/disrupted): Intact.      Size and shape: 9.5 (S-I) x 6.0 (Laterally) x 4.2 (A-P) cm; normal shape.      Weight: 126.4 g.      Serosa: There is a 2.2 x 0.9 x 0.5 cm mutlilocualted cyst on the anterior      fundus. Sectioning reveals serous cystic fluid and focally thickened, tan-white      linings. The remaining serosa has mutliple soft, tan-white nodules up to 0.2 cm      in greatest dimension and faint fibrous adhesions.      Cervix: 3.6 x 3.0 cm with a 0.4 cm os. The ectocervix is tan-gray glistening,      the endocervix is pale tan with herringbone architecture. No lesions are grossly      identified.      Endometrium: 5.0 cm long, 3.6 cm from cornu to cornu; up to 0.2 cm thick and      tan-brown. No lesions are grossly identified.      Myometrium: Tan-pink and moderately trabecular with patchy areas of cystic      dilation and surrounding pallor. No leoimyomata are identified.      Right fallopian tube: 6.5 cm long, 0.5 cm in diameter; sectioning reveals      congested soft tissue surrounding the fallopian tube.      Left fallopian tube: 5.0 cm long, 0.5 cm in diameter; sectioning reveals      congested soft tissue surrounding the fallopian tube.      Block Summary:      1A: Cervix      1B: Serosa, inlcuding representative cyst, nodules, and adhesions  1C: Anterior endomyometrium      1D: Posterior endomyometrium      1E-25F: Left fallopian tube fimbira, bisected and submitted entirely      (1E contains representative cross-sections of fallopian tube)      1G-1H: Right fallopian tube fimbira, bisected and submitted entirely      (1G contains representative cross-sections of fallopian tube)      SMB      06/12/23   Assessment:   Patient s/p ROBOTIC TOTAL LAPAROSCOPIC HYSTERECTOMY WITH BILATERAL SALPINGECTOMY NTRAUTERINE DEVICE (IUD) REMOVAL (surgery)  {doing  well:13525::"Doing well postoperatively."}   Plan:   1. Continue any current medications as instructed by provider. 2. Wound care discussed. 3. Operative findings again reviewed. Pathology report discussed. 4. Activity restrictions: {restrictions:13723} 5. Anticipated return to work: {work return:14002}. 6. Follow up: {1-32:44010} {time; units:18646} for ***    Hildred Laser, MD [facility]

## 2023-07-24 ENCOUNTER — Ambulatory Visit (INDEPENDENT_AMBULATORY_CARE_PROVIDER_SITE_OTHER): Payer: Managed Care, Other (non HMO) | Admitting: Obstetrics and Gynecology

## 2023-07-24 ENCOUNTER — Encounter: Payer: Self-pay | Admitting: Obstetrics and Gynecology

## 2023-07-24 VITALS — BP 121/72 | HR 57 | Resp 16 | Ht 62.0 in | Wt 271.7 lb

## 2023-07-24 DIAGNOSIS — Z4889 Encounter for other specified surgical aftercare: Secondary | ICD-10-CM

## 2023-07-24 DIAGNOSIS — Z90710 Acquired absence of both cervix and uterus: Secondary | ICD-10-CM

## 2023-07-24 DIAGNOSIS — N8003 Adenomyosis of the uterus: Secondary | ICD-10-CM

## 2023-07-24 HISTORY — DX: Adenomyosis of the uterus: N80.03

## 2023-07-25 ENCOUNTER — Ambulatory Visit
Payer: Managed Care, Other (non HMO) | Attending: Student in an Organized Health Care Education/Training Program | Admitting: Student in an Organized Health Care Education/Training Program

## 2023-07-25 ENCOUNTER — Encounter: Payer: Self-pay | Admitting: Student in an Organized Health Care Education/Training Program

## 2023-07-25 VITALS — BP 137/93 | HR 90 | Temp 98.6°F | Resp 16 | Ht 62.0 in | Wt 271.0 lb

## 2023-07-25 DIAGNOSIS — M5136 Other intervertebral disc degeneration, lumbar region with discogenic back pain only: Secondary | ICD-10-CM | POA: Diagnosis not present

## 2023-07-25 DIAGNOSIS — M47816 Spondylosis without myelopathy or radiculopathy, lumbar region: Secondary | ICD-10-CM | POA: Diagnosis not present

## 2023-07-25 DIAGNOSIS — G8929 Other chronic pain: Secondary | ICD-10-CM | POA: Insufficient documentation

## 2023-07-25 DIAGNOSIS — M545 Low back pain, unspecified: Secondary | ICD-10-CM | POA: Insufficient documentation

## 2023-07-25 NOTE — Progress Notes (Signed)
Safety precautions to be maintained throughout the outpatient stay will include: orient to surroundings, keep bed in low position, maintain call bell within reach at all times, provide assistance with transfer out of bed and ambulation.  

## 2023-07-25 NOTE — Progress Notes (Signed)
PROVIDER NOTE: Information contained herein reflects review and annotations entered in association with encounter. Interpretation of such information and data should be left to medically-trained personnel. Information provided to patient can be located elsewhere in the medical record under "Patient Instructions". Document created using STT-dictation technology, any transcriptional errors that may result from process are unintentional.    Patient: Amanda Nash  Service Category: E/M  Provider: Edward Jolly, MD  DOB: 08/06/1986  DOS: 07/25/2023  Referring Provider: Jerrol Banana, MD  MRN: [id number]  Specialty: Interventional Pain Management  PCP: Jerrol Banana, MD  Type: Established Patient  Setting: Ambulatory outpatient    Location: Office  Delivery: Face-to-face     HPI  Amanda Nash, a 37 y.o. year old female, is here today because of her Lumbar facet arthropathy [M47.816]. Amanda Nash primary complain today is low back pain, significantly improved since lumbar facet medial branch nerve blocks   Pain Assessment: Severity of Chronic pain is reported as a 0-No pain/10. Location: Back Lower, Left, Right/denies. Onset: More than a month ago. Quality: Other (Comment) (no pain). Timing: Intermittent. Modifying factor(s): procedure. Vitals:  height is 5\' 2"  (1.575 m) and weight is 271 lb (122.9 kg). Her temporal temperature is 98.6 F (37 C). Her blood pressure is 137/93 (abnormal) and her pulse is 90. Her respiration is 16 and oxygen saturation is 100%.  BMI: Estimated body mass index is 49.57 kg/m as calculated from the following:   Height as of this encounter: 5\' 2"  (1.575 m).   Weight as of this encounter: 271 lb (122.9 kg). Last encounter: 04/25/2023. Last procedure: 06/06/2023.  Reason for encounter: post-procedure evaluation and assessment.    Post-procedure evaluation   Procedure: Lumbar Facet, Medial Branch Block(s) #2  Laterality: Bilateral  Level: L3, L4, and L5  Medial Branch Level(s). Injecting these levels blocks the L3-4 and L4-5 lumbar facet joints. Imaging: Fluoroscopic guidance         Anesthesia: Local anesthesia (1-2% Lidocaine) DOS: 06/06/2023 Performed by: Edward Jolly, MD  Primary Purpose: Diagnostic/Therapeutic Indications: Low back pain severe enough to impact quality of life or function. 1. Lumbar facet arthropathy   2. Chronic bilateral low back pain without sciatica   3. Lumbar degenerative disc disease    NAS-11 Pain score:   Pre-procedure: 4 /10   Post-procedure: 2 /10      Effectiveness:  Initial hour after procedure: 80 %  Subsequent 4-6 hours post-procedure: 70 %  Analgesia past initial 6 hours: 75 % (good pain relief in the lower back other than the intermittent twinges that she continues to have.)  Ongoing improvement:  Analgesic:  75 percent Function: Somewhat improved ROM: Somewhat improved   ROS  Constitutional: Denies any fever or chills Gastrointestinal: No reported hemesis, hematochezia, vomiting, or acute GI distress Musculoskeletal: Denies any acute onset joint swelling, redness, loss of ROM, or weakness Neurological: No reported episodes of acute onset apraxia, aphasia, dysarthria, agnosia, amnesia, paralysis, loss of coordination, or loss of consciousness  Medication Review  ARIPiprazole, Vitamin D, atomoxetine, losartan-hydrochlorothiazide, nystatin-triamcinolone ointment, omeprazole, propranolol, rizatriptan, topiramate, and traZODone  History Review  Allergy: Amanda Nash has No Known Allergies. Drug: Amanda Nash  reports that she does not currently use drugs after having used the following drugs: Marijuana. Alcohol:  reports that she does not currently use alcohol after a past usage of about 7.0 standard drinks of alcohol per week. Tobacco:  reports that she has been smoking cigarettes and e-cigarettes. She has never used smokeless  tobacco. Social: Amanda Nash  reports that she has been smoking  cigarettes and e-cigarettes. She has never used smokeless tobacco. She reports that she does not currently use alcohol after a past usage of about 7.0 standard drinks of alcohol per week. She reports that she does not currently use drugs after having used the following drugs: Marijuana. Medical:  has a past medical history of Adenomyosis (07/24/2023), ADHD, Anemia, Anxiety, Bipolar 1 disorder (HCC) (2016), Borderline personality disorder (HCC), Depression, Diabetes mellitus without complication (HCC), Epistaxis, recurrent (02/13/2022), GERD (gastroesophageal reflux disease), Headache, HLD (hyperlipidemia), Hypertension, Lumbar degenerative disc disease, Segmental and somatic dysfunction of cervical region, Sleep apnea, and Thrombocytosis. Surgical: Amanda Nash  has a past surgical history that includes Dilatation & currettage/hysteroscopy with resectoscope (2011); Wisdom tooth extraction; Robotic assisted total hysterectomy with bilateral salpingo oophorectomy (N/A, 06/11/2023); and IUD removal (N/A, 06/11/2023). Family: family history includes Anxiety disorder in her brother and mother; Bipolar disorder in her father; Colon cancer in her maternal grandfather; Depression in her brother and mother; Diabetes in her father; Heart disease in her mother; Hypertension in her father.  Laboratory Chemistry Profile   Renal Lab Results  Component Value Date   BUN 17 06/06/2023   CREATININE 0.84 06/06/2023   BCR 19 01/15/2023   GFRAA 116 05/22/2019   GFRNONAA >60 06/06/2023    Hepatic Lab Results  Component Value Date   AST 15 06/06/2023   ALT 29 06/06/2023   ALBUMIN 3.8 06/06/2023   ALKPHOS 53 06/06/2023    Electrolytes Lab Results  Component Value Date   NA 137 06/06/2023   K 3.7 06/06/2023   CL 106 06/06/2023   CALCIUM 8.9 06/06/2023    Bone Lab Results  Component Value Date   VD25OH 26.9 (L) 01/15/2023    Inflammation (CRP: Acute Phase) (ESR: Chronic Phase) No results found for: "CRP",  "ESRSEDRATE", "LATICACIDVEN"       Note: Above Lab results reviewed.  Recent Imaging Review  DG PAIN CLINIC C-ARM 1-60 MIN NO REPORT Fluoro was used, but no Radiologist interpretation will be provided.  Please refer to "NOTES" tab for provider progress note. Note: Reviewed        Physical Exam  General appearance: Well nourished, well developed, and well hydrated. In no apparent acute distress Mental status: Alert, oriented x 3 (person, place, & time)       Respiratory: No evidence of acute respiratory distress Eyes: PERLA Vitals: BP (!) 137/93 (BP Location: Right Arm, Patient Position: Sitting, Cuff Size: Normal) Comment (Cuff Size): forearm  Pulse 90   Temp 98.6 F (37 C) (Temporal)   Resp 16   Ht 5\' 2"  (1.575 m)   Wt 271 lb (122.9 kg)   LMP 04/12/2023   SpO2 100%   BMI 49.57 kg/m  BMI: Estimated body mass index is 49.57 kg/m as calculated from the following:   Height as of this encounter: 5\' 2"  (1.575 m).   Weight as of this encounter: 271 lb (122.9 kg). Ideal: Ideal body weight: 50.1 kg (110 lb 7.2 oz) Adjusted ideal body weight: 79.2 kg (174 lb 10.7 oz)  Assessment   Diagnosis Status  1. Lumbar facet arthropathy   2. Chronic bilateral low back pain without sciatica   3. Degeneration of intervertebral disc of lumbar region with discogenic back pain    Controlled Controlled Controlled   Updated Problems: No problems updated.  Plan of Care  Excellent response that is ongoing after lumbar facet medial branch nerve block #2.  We  will continue to monitor her symptoms.  Neck step would be lumbar radiofrequency ablation medial branch nerves as she has had 2 positive diagnostic lumbar facet medial branch nerve blocks.  Patient will call us to schedule when needed.  Follow-up plan:   Return for patient will call to schedule F2F appt prn.      Bilateral L3, L4, L5 lumbar facet medial branch nerve block 04/04/2023, 06/06/23     Recent Visits Date Type Provider Dept   06/06/23 Procedure visit Edward Jolly, MD [facility]  Showing recent visits within past 90 days and meeting all other requirements Today's Visits Date Type Provider Dept  07/25/23 Office Visit Edward Jolly, MD [facility]  Showing today's visits and meeting all other requirements Future Appointments No visits were found meeting these conditions. Showing future appointments within next 90 days and meeting all other requirements  I discussed the assessment and treatment plan with the patient. The patient was provided an opportunity to ask questions and all were answered. The patient agreed with the plan and demonstrated an understanding of the instructions.  Patient advised to call back or seek an in-person evaluation if the symptoms or condition worsens.  Duration of encounter: .  Total time on encounter, as per AMA guidelines included both the face-to-face and non-face-to-face time personally spent by the physician and/or other qualified health care professional(s) on the day of the encounter (includes time in activities that require the physician or other qualified health care professional and does not include time in activities normally performed by clinical staff). Physician's time may include the following activities when performed: Preparing to see the patient (e.g., pre-charting review of records, searching for previously ordered imaging, lab work, and nerve conduction tests) Review of prior analgesic pharmacotherapies. Reviewing PMP Interpreting ordered tests (e.g., lab work, imaging, nerve conduction tests) Performing post-procedure evaluations, including interpretation of diagnostic procedures Obtaining and/or reviewing separately obtained history Performing a medically appropriate examination and/or evaluation Counseling and educating the patient/family/caregiver Ordering medications, tests, or procedures Referring and communicating with other  health care professionals (when not separately reported) Documenting clinical information in the electronic or other health record Independently interpreting results (not separately reported) and communicating results to the patient/ family/caregiver Care coordination (not separately reported)  Note by: Edward Jolly, MD Date: 07/25/2023; Time: 3:30 PM

## 2023-09-03 ENCOUNTER — Encounter: Payer: Self-pay | Admitting: Family Medicine

## 2023-09-03 NOTE — Telephone Encounter (Signed)
 Care team updated and letter sent for eye exam notes.

## 2023-09-17 ENCOUNTER — Encounter: Payer: Self-pay | Admitting: Family Medicine

## 2023-09-17 ENCOUNTER — Ambulatory Visit: Payer: Managed Care, Other (non HMO) | Admitting: Family Medicine

## 2023-09-17 VITALS — BP 112/80 | HR 97 | Ht 62.0 in | Wt 269.4 lb

## 2023-09-17 DIAGNOSIS — J020 Streptococcal pharyngitis: Secondary | ICD-10-CM | POA: Diagnosis not present

## 2023-09-17 LAB — POCT RAPID STREP A (OFFICE): Rapid Strep A Screen: POSITIVE — AB

## 2023-09-17 MED ORDER — AMOXICILLIN 500 MG PO TABS
1000.0000 mg | ORAL_TABLET | Freq: Every day | ORAL | 0 refills | Status: AC
Start: 2023-09-17 — End: 2023-09-27

## 2023-09-17 NOTE — Patient Instructions (Addendum)
 Patient Plan for Strep Throat:  Strep Throat: - You have tested positive for strep throat, a bacterial infection causing a severe sore throat, difficulty swallowing, and fever. - We are starting you on a 10-day course of antibiotics to effectively treat the infection. - For symptomatic relief, you may use salt water gargles, warm teas, honey, Tylenol , and ibuprofen  as needed. - Please inform your close contacts about your diagnosis to prevent the spread of the infection.

## 2023-09-17 NOTE — Progress Notes (Signed)
     Primary Care / Sports Medicine Office Visit  Patient Information:  Patient ID: Amanda Nash, female DOB: [birth year] Age: 38 y.o. MRN: [id number]   Amanda Nash is a pleasant 38 y.o. female presenting with the following:  Chief Complaint  Patient presents with   Sore Throat    Patient presents today for sore throat since 09/14/23. She is having difficulty swallowing. She has tried taking OTC medications but this has not worked. She denies any body ache. She had a fever of 102 on 09/15/23.     Vitals:   09/17/23 1105  BP: 112/80  Pulse: 97  SpO2: 98%   Vitals:   09/17/23 1105  Weight: 269 lb 6.4 oz (122.2 kg)  Height: 5' 2 (1.575 m)   Body mass index is 49.27 kg/m.  No results found.   Independent interpretation of notes and tests performed by another provider:   None  Procedures performed:   None  Pertinent History, Exam, Impression, and Recommendations:   Problem List Items Addressed This Visit       Respiratory   Pharyngitis due to group A beta hemolytic Streptococci - Primary   History of Present Illness Amanda Nash presents with a sore throat that started on Friday. She describes the initial symptoms as an average sore throat, which she didn't find alarming as she occasionally wakes up with a sore throat that resolves within a day. However, the sore throat worsened significantly by Saturday, making swallowing difficult to the point where she had to turn her head each time she swallowed. This difficulty persisted through Sunday. Along with the sore throat, Amanda Nash developed a fever on Saturday with reported Tmax 102 degrees. She managed the fever with rest, fluids, and over-the-counter medications, including Tylenol  and DayQuil. By Sunday morning, the fever had resolved, but the sore throat persisted, prompting her to seek medical attention.  Physical Exam HEENT: Oropharynx markedly erythematous with swelling, right sided focal punctate area of minor  exudate.  Results LABS POC Strep A: Positive for Streptococcus (09/16/2023)  Assessment and Plan Strep Pharyngitis Acute onset of severe sore throat, difficulty swallowing, and fever up to 102F. Positive POC rapid strep test. -Start Amoxicillin  1000 mg x 10 days. -Advise on symptomatic relief measures including salt water gargles, warm teas, honey, Tylenol , and ibuprofen . -Advise patient to inform close contacts about her diagnosis.      Relevant Medications   amoxicillin  (AMOXIL ) 500 MG tablet   Other Relevant Orders   POCT rapid strep A (Completed)     Orders & Medications Medications:  Meds ordered this encounter  Medications   amoxicillin  (AMOXIL ) 500 MG tablet    Sig: Take 2 tablets (1,000 mg total) by mouth daily for 10 days.    Dispense:  20 tablet    Refill:  0   Orders Placed This Encounter  Procedures   POCT rapid strep A     No follow-ups on file.     Amanda [name], MD, [facility]   Primary Care Sports Medicine Primary Care and Sports Medicine at [facility]

## 2023-09-17 NOTE — Assessment & Plan Note (Signed)
 History of Present Illness Amanda Nash presents with a sore throat that started on Friday. She describes the initial symptoms as an average sore throat, which she didn't find alarming as she occasionally wakes up with a sore throat that resolves within a day. However, the sore throat worsened significantly by Saturday, making swallowing difficult to the point where she had to turn her head each time she swallowed. This difficulty persisted through Sunday. Along with the sore throat, [name] developed a fever on Saturday with reported Tmax 102 degrees. She managed the fever with rest, fluids, and over-the-counter medications, including Tylenol  and DayQuil. By Sunday morning, the fever had resolved, but the sore throat persisted, prompting her to seek medical attention.  Physical Exam HEENT: Oropharynx markedly erythematous with swelling, right sided focal punctate area of minor exudate.  Results LABS POC Strep A: Positive for Streptococcus (09/16/2023)  Assessment and Plan Strep Pharyngitis Acute onset of severe sore throat, difficulty swallowing, and fever up to 102F. Positive POC rapid strep test. -Start Amoxicillin  1000 mg x 10 days. -Advise on symptomatic relief measures including salt water gargles, warm teas, honey, Tylenol , and ibuprofen . -Advise patient to inform close contacts about her diagnosis.

## 2023-10-25 NOTE — Progress Notes (Deleted)
 PCP:  Jerrol Banana, MD   No chief complaint on file.    HPI:      Amanda Nash is a 38 y.o. G1P0010 whose LMP was Patient's last menstrual period was 04/12/2023., presents today for her annual examination.  Her menses are {norm/abn:715}, lasting {number: 22536} days.  Dysmenorrhea {dysmen:716}. She {does:18564} have intermenstrual bleeding. ROBOTIC TOTAL LAPAROSCOPIC HYSTERECTOMY WITH BILATERAL SALPINGECTOMY NTRAUTERINE DEVICE (IUD) REMOVAL for  Pelvic pain, Dyspareunia, History of abnormal uterine bleeding, I 10/24   Sex activity: {sex active: 315163}.  Last Pap: 02/27/23  Results were: no abnormalities /neg HPV DNA  Hx of STDs: {STD hx:14358}  Last mammogram: {date:304500300}  Results were: {norm/abn:13465} There is no FH of breast cancer. There is no FH of ovarian cancer. The patient {does:18564} do self-breast exams.  Tobacco use: {tob:20664} Alcohol use: {Alcohol:11675} No drug use.  Exercise: {exercise:31265}  She {does:18564} get adequate calcium and Vitamin D in her diet.  Patient Active Problem List   Diagnosis Date Noted   Pharyngitis due to group A beta hemolytic Streptococci 09/17/2023   Lumbar facet arthropathy 02/27/2023   Lumbar degenerative disc disease 02/27/2023   Chronic bilateral low back pain without sciatica 02/27/2023   Diabetes mellitus (HCC) 01/19/2023   Healthcare maintenance 01/09/2023   OSA (obstructive sleep apnea) 11/28/2022   Elevated fasting glucose 09/15/2022   Hyperlipidemia 09/15/2022   Annual physical exam 05/05/2022   Patellofemoral arthralgia of left knee 05/05/2022   Gastroesophageal reflux disease with esophagitis without hemorrhage 02/13/2022   Chronic lumbar radiculopathy 12/05/2021   Cervical radiculopathy 08/23/2021   Bilateral plantar wart 08/23/2021   Segmental and somatic dysfunction of cervical region 06/22/2021   Rotator cuff impingement syndrome of left shoulder 06/22/2021   Encounter for annual physical  exam 05/03/2021   Carpal tunnel syndrome, bilateral 03/29/2021   Primary hypertension 10/28/2020   Prediabetes 12/24/2018   Thrombocytosis 12/24/2018   Anemia 12/24/2018   Morbid obesity (HCC) 12/02/2018   Borderline personality disorder (HCC) 09/27/2016    Past Surgical History:  Procedure Laterality Date   DILATATION & CURRETTAGE/HYSTEROSCOPY WITH RESECTOCOPE  2011   IUD REMOVAL N/A 06/11/2023   Procedure: INTRAUTERINE DEVICE (IUD) REMOVAL;  Surgeon: Hildred Laser, MD;  Location: [facility] ORS;  Service: Gynecology;  Laterality: N/A;   ROBOTIC ASSISTED TOTAL HYSTERECTOMY WITH BILATERAL SALPINGO OOPHERECTOMY N/A 06/11/2023   Procedure: ROBOTIC TOTAL LAPAROSCOPIC HYSTERECTOMY WITH BILATERAL SALPINGECTOMY;  Surgeon: Hildred Laser, MD;  Location: [facility] ORS;  Service: Gynecology;  Laterality: N/A;   WISDOM TOOTH EXTRACTION      Family History  Problem Relation Age of Onset   Depression Mother    Anxiety disorder Mother    Heart disease Mother    Bipolar disorder Father    Hypertension Father    Diabetes Father    Depression Brother    Anxiety disorder Brother    Colon cancer Maternal Grandfather     Social History   Socioeconomic History   Marital status: Legally Separated    Spouse name: Kennetha Pearman   Number of children: 0   Years of education: 14   Highest education level: Associate degree: academic program  Occupational History   Occupation: Public house manager    Comment: [employer]  Tobacco Use   Smoking status: Every Day    Types: Cigarettes, E-cigarettes   Smokeless tobacco: Never   Tobacco comments:    Vape daily - currently 01/30/2023, no cigarettes  Vaping Use   Vaping status: Every Day   Substances: Nicotine,  Flavoring  Substance and Sexual Activity   Alcohol use: Not Currently    Alcohol/week: 7.0 standard drinks of alcohol    Types: 5 Cans of beer, 2 Shots of liquor per week   Drug use: Not Currently    Types: Marijuana   Sexual activity: Yes    Partners:  Male    Birth control/protection: I.U.D.    Comment: Mirena  Other Topics Concern   Not on file  Social History Narrative   Live with husband and her brother, has 2 dogs and a bunny at home. Has stepdaughter that does not live with them.   Social Drivers of Corporate investment banker Strain: Low Risk  (12/02/2018)   Overall Financial Resource Strain (CARDIA)    Difficulty of Paying Living Expenses: Not hard at all  Food Insecurity: No Food Insecurity (12/02/2018)   Hunger Vital Sign    Worried About Running Out of Food in the Last Year: Never true    Ran Out of Food in the Last Year: Never true  Transportation Needs: No Transportation Needs (12/02/2018)   PRAPARE - Administrator, Civil Service (Medical): No    Lack of Transportation (Non-Medical): No  Physical Activity: Inactive (12/02/2018)   Exercise Vital Sign    Days of Exercise per Week: 0 days    Minutes of Exercise per Session: 0 min  Stress: Stress Concern Present (12/02/2018)   [employer] of Occupational Health - Occupational Stress Questionnaire    Feeling of Stress : Rather much  Social Connections: Moderately Integrated (12/02/2018)   Social Connection and Isolation Panel [NHANES]    Frequency of Communication with Friends and Family: More than three times a week    Frequency of Social Gatherings with Friends and Family: More than three times a week    Attends Religious Services: More than 4 times per year    Active Member of [employer] or Organizations: No    Attends Banker Meetings: Never    Marital Status: Married  Catering manager Violence: Not At Risk (12/02/2018)   Humiliation, Afraid, Rape, and Kick questionnaire    Fear of Current or Ex-Partner: No    Emotionally Abused: No    Physically Abused: No    Sexually Abused: No     Current Outpatient Medications:    ARIPiprazole (ABILIFY) 10 MG tablet, Take 10 mg by mouth daily., Disp: , Rfl:    atomoxetine (STRATTERA) 80 MG capsule,  Take 80 mg by mouth daily., Disp: , Rfl:    clonazePAM (KLONOPIN) 0.5 MG tablet, Take 0.25-0.5 mg by mouth daily as needed., Disp: , Rfl:    losartan-hydrochlorothiazide (HYZAAR) 50-12.5 MG tablet, Take 1 tablet by mouth daily., Disp: , Rfl:    omeprazole (PRILOSEC) 40 MG capsule, Take 40 mg by mouth daily., Disp: , Rfl:    propranolol (INDERAL) 40 MG tablet, Take 40 mg by mouth 2 (two) times daily., Disp: , Rfl:    rizatriptan (MAXALT-MLT) 5 MG disintegrating tablet, Take 5 mg by mouth as needed for migraine., Disp: , Rfl:    topiramate (TOPAMAX) 25 MG tablet, Take 25 mg by mouth 2 (two) times daily., Disp: , Rfl:    traZODone (DESYREL) 150 MG tablet, Take 150 mg by mouth at bedtime., Disp: , Rfl:    VITAMIN D PO, Take 2,000 Units by mouth daily., Disp: , Rfl:      ROS:  Review of Systems BREAST: No symptoms   Objective: LMP 04/12/2023  OBGyn Exam  Results: No results found for this or any previous visit (from the past 24 hours).  Assessment/Plan: No diagnosis found.  No orders of the defined types were placed in this encounter.            GYN counsel {counseling: 16159}     F/U  No follow-ups on file.  Amanda Nash B. Brandie Lopes, PA-C 10/25/2023 5:17 PM

## 2023-10-29 ENCOUNTER — Ambulatory Visit: Payer: Managed Care, Other (non HMO) | Admitting: Obstetrics and Gynecology

## 2023-11-01 ENCOUNTER — Encounter: Payer: Self-pay | Admitting: Obstetrics and Gynecology

## 2023-11-22 ENCOUNTER — Telehealth: Admitting: Family Medicine

## 2023-11-22 ENCOUNTER — Encounter: Payer: Self-pay | Admitting: Family Medicine

## 2023-11-22 DIAGNOSIS — G43809 Other migraine, not intractable, without status migrainosus: Secondary | ICD-10-CM | POA: Diagnosis not present

## 2023-11-22 MED ORDER — ONDANSETRON 4 MG PO TBDP: 4.0000 mg | ORAL_TABLET | Freq: Three times a day (TID) | ORAL | 0 refills | Status: DC | PRN

## 2023-11-22 NOTE — Progress Notes (Signed)
 E-Visit for Nausea and Vomiting   We are sorry that you are not feeling well. Here is how we plan to help!  Based on what you have shared with me it looks like you have a Virus that is irritating your GI tract.  Vomiting is the forceful emptying of a portion of the stomach's content through the mouth.  Although nausea and vomiting can make you feel miserable, it's important to remember that these are not diseases, but rather symptoms of an underlying illness.  When we treat short term symptoms, we always caution that any symptoms that persist should be fully evaluated in a medical office.  I have prescribed a medication that will help alleviate your symptoms and allow you to stay hydrated:  Zofran 4 mg 1 tablet every 8 hours as needed for nausea and vomiting  HOME CARE: Drink clear liquids.  This is very important! Dehydration (the lack of fluid) can lead to a serious complication.  Start off with 1 tablespoon every 5 minutes for 8 hours. You may begin eating bland foods after 8 hours without vomiting.  Start with saltine crackers, white bread, rice, mashed potatoes, applesauce. After 48 hours on a bland diet, you may resume a normal diet. Try to go to sleep.  Sleep often empties the stomach and relieves the need to vomit.  GET HELP RIGHT AWAY IF:  Your symptoms do not improve or worsen within 2 days after treatment. You have a fever for over 3 days. You cannot keep down fluids after trying the medication.  MAKE SURE YOU:  Understand these instructions. Will watch your condition. Will get help right away if you are not doing well or get worse.    Thank you for choosing an e-visit.  Your e-visit answers were reviewed by a board certified advanced clinical practitioner to complete your personal care plan. Depending upon the condition, your plan could have included both over the counter or prescription medications.  Please review your pharmacy choice. Make sure the pharmacy is open so  you can pick up prescription now. If there is a problem, you may contact your provider through [employer] and have the prescription routed to another pharmacy.  Your safety is important to [place]. If you have drug allergies check your prescription carefully.   For the next 24 hours you can use MyChart to ask questions about today's visit, request a non-urgent call back, or ask for a work or school excuse. You will get an email in the next two days asking about your experience. I hope that your e-visit has been valuable and will speed your recovery.  I provided 5 minutes of non face-to-face time during this encounter for chart review, medication and order placement, as well as and documentation.

## 2024-01-11 ENCOUNTER — Encounter: Payer: Self-pay | Admitting: Family Medicine

## 2024-01-29 ENCOUNTER — Telehealth: Admitting: Family Medicine

## 2024-01-29 DIAGNOSIS — J069 Acute upper respiratory infection, unspecified: Secondary | ICD-10-CM | POA: Diagnosis not present

## 2024-01-29 MED ORDER — CETIRIZINE HCL 10 MG PO TABS
10.0000 mg | ORAL_TABLET | Freq: Every day | ORAL | 0 refills | Status: AC
Start: 2024-01-29 — End: ?

## 2024-01-29 MED ORDER — PSEUDOEPH-BROMPHEN-DM 30-2-10 MG/5ML PO SYRP: 5.0000 mL | ORAL_SOLUTION | Freq: Four times a day (QID) | ORAL | 0 refills | Status: DC | PRN

## 2024-01-29 MED ORDER — ONDANSETRON 4 MG PO TBDP
4.0000 mg | ORAL_TABLET | Freq: Three times a day (TID) | ORAL | 0 refills | Status: DC | PRN
Start: 2024-01-29 — End: 2024-07-14

## 2024-01-29 NOTE — Patient Instructions (Signed)
  Amanda Nash, thank you for joining [name], NP for today's virtual visit.  While this provider is not your primary care provider (PCP), if your PCP is located in our provider database this encounter information will be shared with them immediately following your visit.   A [facility] MyChart account gives you access to today's visit and all your visits, tests, and labs performed at [facility] " click here if you don't have a [facility] MyChart account or go to [email or web address]  Consent: (Patient) Amanda Nash provided verbal consent for this virtual visit at the beginning of the encounter.  Current Medications:  Current Outpatient Medications:    brompheniramine-pseudoephedrine-DM 30-2-10 MG/5ML syrup, Take 5 mLs by mouth 4 (four) times daily as needed., Disp: 120 mL, Rfl: 0   cetirizine (ZYRTEC ALLERGY) 10 MG tablet, Take 1 tablet (10 mg total) by mouth daily., Disp: 30 tablet, Rfl: 0   ondansetron  (ZOFRAN -ODT) 4 MG disintegrating tablet, Take 1 tablet (4 mg total) by mouth every 8 (eight) hours as needed for nausea or vomiting., Disp: 20 tablet, Rfl: 0   omeprazole (PRILOSEC) 40 MG capsule, Take 40 mg by mouth daily., Disp: , Rfl:    Medications ordered in this encounter:  Meds ordered this encounter  Medications   brompheniramine-pseudoephedrine-DM 30-2-10 MG/5ML syrup    Sig: Take 5 mLs by mouth 4 (four) times daily as needed.    Dispense:  120 mL    Refill:  0    Supervising Provider:   Corine Dice [[id number]]   ondansetron  (ZOFRAN -ODT) 4 MG disintegrating tablet    Sig: Take 1 tablet (4 mg total) by mouth every 8 (eight) hours as needed for nausea or vomiting.    Dispense:  20 tablet    Refill:  0    Supervising Provider:   LAMPTEY, PHILIP O [[id number]]   cetirizine (ZYRTEC ALLERGY) 10 MG tablet    Sig: Take 1 tablet (10 mg total) by mouth daily.    Dispense:  30 tablet    Refill:  0    Supervising Provider:   LAMPTEY, PHILIP O [[id number]]      *If you need refills on other medications prior to your next appointment, please contact your pharmacy*  Follow-Up: Call back or seek an in-person evaluation if the symptoms worsen or if the condition fails to improve as anticipated.  [facility] [phone]  Other Instructions  URI recommendations: - Increased rest - Increasing Fluids - Acetaminophen  / ibuprofen  as needed for fever/pain.  - Salt water gargling, chloraseptic spray and throat lozenges - Mucinex if mucus is present and increasing.  - Saline nasal spray if congestion or if nasal passages feel dry. - Humidifying the air.      If you have been instructed to have an in-person evaluation today at a local Urgent Care facility, please use the link below. It will take you to a list of all of our available [facility], including address, phone number and hours of operation. Please do not delay care.  [facility]  If you or a family member do not have a primary care provider, use the link below to schedule a visit and establish care. When you choose a [facility] primary care physician or advanced practice provider, you gain a long-term partner in health. Find a Primary Care Provider  Learn more about [facility]'s in-office and virtual care options: [facility] - Get Care Now

## 2024-01-29 NOTE — Progress Notes (Signed)
 Virtual Visit Consent   Amanda Nash, you are scheduled for a virtual visit with a [facility] provider today. Just as with appointments in the office, your consent must be obtained to participate. Your consent will be active for this visit and any virtual visit you may have with one of our providers in the next 365 days. If you have a MyChart account, a copy of this consent can be sent to you electronically.  As this is a virtual visit, video technology does not allow for your provider to perform a traditional examination. This may limit your provider's ability to fully assess your condition. If your provider identifies any concerns that need to be evaluated in person or the need to arrange testing (such as labs, EKG, etc.), we will make arrangements to do so. Although advances in technology are sophisticated, we cannot ensure that it will always work on either your end or our end. If the connection with a video visit is poor, the visit may have to be switched to a telephone visit. With either a video or telephone visit, we are not always able to ensure that we have a secure connection.  By engaging in this virtual visit, you consent to the provision of healthcare and authorize for your insurance to be billed (if applicable) for the services provided during this visit. Depending on your insurance coverage, you may receive a charge related to this service.  I need to obtain your verbal consent now. Are you willing to proceed with your visit today? Amanda Nash has provided verbal consent on 01/29/2024 for a virtual visit (video or telephone). [name], NP  Date: 01/29/2024 11:02 AM   Virtual Visit via Video Note   I, [name], connected with  Amanda Nash  ([id number], 1986-01-05) on 01/29/24 at 11:00 AM EDT by a video-enabled telemedicine application and verified that I am speaking with the correct person using two identifiers.  Location: Patient: Virtual Visit Location Patient:  Home Provider: Virtual Visit Location Provider: Home Office   I discussed the limitations of evaluation and management by telemedicine and the availability of in person appointments. The patient expressed understanding and agreed to proceed.    History of Present Illness: Amanda Nash is a 38 y.o. who identifies as a female who was assigned female at birth, and is being seen today for URI  Onset was Saturday with sore throat and congestion Associated symptoms are having mucus that is yellow and thick, and at times causing her to vomit. Modifying factors are tylenol  cold and flu  Denies chest pain, shortness of breath, fevers, chills  Exposure to sick contacts- unknown COVID test: no  Problems:  Patient Active Problem List   Diagnosis Date Noted   Pharyngitis due to group A beta hemolytic Streptococci 09/17/2023   Lumbar facet arthropathy 02/27/2023   Lumbar degenerative disc disease 02/27/2023   Chronic bilateral low back pain without sciatica 02/27/2023   Diabetes mellitus (HCC) 01/19/2023   Healthcare maintenance 01/09/2023   OSA (obstructive sleep apnea) 11/28/2022   Elevated fasting glucose 09/15/2022   Hyperlipidemia 09/15/2022   Annual physical exam 05/05/2022   Patellofemoral arthralgia of left knee 05/05/2022   Gastroesophageal reflux disease with esophagitis without hemorrhage 02/13/2022   Chronic lumbar radiculopathy 12/05/2021   Cervical radiculopathy 08/23/2021   Bilateral plantar wart 08/23/2021   Segmental and somatic dysfunction of cervical region 06/22/2021   Rotator cuff impingement syndrome of left shoulder 06/22/2021   Encounter for annual  physical exam 05/03/2021   Carpal tunnel syndrome, bilateral 03/29/2021   Primary hypertension 10/28/2020   Prediabetes 12/24/2018   Thrombocytosis 12/24/2018   Anemia 12/24/2018   Morbid obesity (HCC) 12/02/2018   Borderline personality disorder (HCC) 09/27/2016    Allergies: No Known Allergies Medications:   Current Outpatient Medications:    ARIPiprazole (ABILIFY) 10 MG tablet, Take 10 mg by mouth daily., Disp: , Rfl:    atomoxetine (STRATTERA) 80 MG capsule, Take 80 mg by mouth daily., Disp: , Rfl:    clonazePAM (KLONOPIN) 0.5 MG tablet, Take 0.25-0.5 mg by mouth daily as needed., Disp: , Rfl:    omeprazole (PRILOSEC) 40 MG capsule, Take 40 mg by mouth daily., Disp: , Rfl:    ondansetron  (ZOFRAN -ODT) 4 MG disintegrating tablet, Take 1 tablet (4 mg total) by mouth every 8 (eight) hours as needed for nausea or vomiting., Disp: 20 tablet, Rfl: 0   propranolol (INDERAL) 40 MG tablet, Take 40 mg by mouth 2 (two) times daily., Disp: , Rfl:    rizatriptan (MAXALT-MLT) 5 MG disintegrating tablet, Take 5 mg by mouth as needed for migraine., Disp: , Rfl:    topiramate (TOPAMAX) 25 MG tablet, Take 25 mg by mouth 2 (two) times daily., Disp: , Rfl:    traZODone (DESYREL) 150 MG tablet, Take 150 mg by mouth at bedtime., Disp: , Rfl:    VITAMIN D  PO, Take 2,000 Units by mouth daily., Disp: , Rfl:   Observations/Objective: Patient is well-developed, well-nourished in no acute distress.  Resting comfortably  at home.  Head is normocephalic, atraumatic.  No labored breathing.  Speech is clear and coherent with logical content.  Patient is alert and oriented at baseline.    Assessment and Plan:  1. Viral URI with cough (Primary)  - brompheniramine-pseudoephedrine-DM 30-2-10 MG/5ML syrup; Take 5 mLs by mouth 4 (four) times daily as needed.  Dispense: 120 mL; Refill: 0 - ondansetron  (ZOFRAN -ODT) 4 MG disintegrating tablet; Take 1 tablet (4 mg total) by mouth every 8 (eight) hours as needed for nausea or vomiting.  Dispense: 20 tablet; Refill: 0 - cetirizine (ZYRTEC ALLERGY) 10 MG tablet; Take 1 tablet (10 mg total) by mouth daily.  Dispense: 30 tablet; Refill: 0  URI recommendations: - Increased rest - Increasing Fluids - Acetaminophen  / ibuprofen  as needed for fever/pain.  - Salt water gargling,  chloraseptic spray and throat lozenges - Mucinex if mucus is present and increasing.  - Saline nasal spray if congestion or if nasal passages feel dry. - Humidifying the air.     Reviewed side effects, risks and benefits of medication.    Patient acknowledged agreement and understanding of the plan.   Past Medical, Surgical, Social History, Allergies, and Medications have been Reviewed.    Follow Up Instructions: I discussed the assessment and treatment plan with the patient. The patient was provided an opportunity to ask questions and all were answered. The patient agreed with the plan and demonstrated an understanding of the instructions.  A copy of instructions were sent to the patient via [employer] unless otherwise noted below.   The patient was advised to call back or seek an in-person evaluation if the symptoms worsen or if the condition fails to improve as anticipated.    [name], NP

## 2024-02-21 ENCOUNTER — Encounter: Payer: Self-pay | Admitting: Obstetrics and Gynecology

## 2024-02-21 ENCOUNTER — Ambulatory Visit: Admitting: Obstetrics and Gynecology

## 2024-02-21 VITALS — BP 139/87 | HR 101 | Ht 62.0 in | Wt 264.0 lb

## 2024-02-21 DIAGNOSIS — N6312 Unspecified lump in the right breast, upper inner quadrant: Secondary | ICD-10-CM | POA: Diagnosis not present

## 2024-02-21 NOTE — Progress Notes (Signed)
 Amanda Saupe, MD   Chief Complaint  Patient presents with   Breast Exam    Lump on RB noticed last night, not painful/tender.     HPI:      Amanda Nash is a 38 y.o. G1P0010 whose LMP was Patient's last menstrual period was 04/12/2023., presents today for RT breast mass she noticed on breast exam yesterday, more palpable in upright position than supine. No pain/tenderness. No redness, trauma, nipple d/c. No hx of breast masses; no prior imaging. FH breast cancer in 3rd degree relatives.  S/p lap TAHBS 10/24 for pelvic pain/adenomyosis with Dr. Denman Fischer. Doing well.   Patient Active Problem List   Diagnosis Date Noted   Pharyngitis due to group A beta hemolytic Streptococci 09/17/2023   Lumbar facet arthropathy 02/27/2023   Lumbar degenerative disc disease 02/27/2023   Chronic bilateral low back pain without sciatica 02/27/2023   Diabetes mellitus (HCC) 01/19/2023   Healthcare maintenance 01/09/2023   OSA (obstructive sleep apnea) 11/28/2022   Elevated fasting glucose 09/15/2022   Hyperlipidemia 09/15/2022   Annual physical exam 05/05/2022   Patellofemoral arthralgia of left knee 05/05/2022   Gastroesophageal reflux disease with esophagitis without hemorrhage 02/13/2022   Chronic lumbar radiculopathy 12/05/2021   Cervical radiculopathy 08/23/2021   Bilateral plantar wart 08/23/2021   Segmental and somatic dysfunction of cervical region 06/22/2021   Rotator cuff impingement syndrome of left shoulder 06/22/2021   Encounter for annual physical exam 05/03/2021   Carpal tunnel syndrome, bilateral 03/29/2021   Primary hypertension 10/28/2020   Prediabetes 12/24/2018   Thrombocytosis 12/24/2018   Anemia 12/24/2018   Morbid obesity (HCC) 12/02/2018   Borderline personality disorder (HCC) 09/27/2016    Past Surgical History:  Procedure Laterality Date   DILATATION & CURRETTAGE/HYSTEROSCOPY WITH RESECTOCOPE  2011   IUD REMOVAL N/A 06/11/2023   Procedure: INTRAUTERINE  DEVICE (IUD) REMOVAL;  Surgeon: Teresa Fender, MD;  Location: [facility];  Service: Gynecology;  Laterality: N/A;   ROBOTIC ASSISTED TOTAL HYSTERECTOMY WITH BILATERAL SALPINGO OOPHERECTOMY N/A 06/11/2023   Procedure: ROBOTIC TOTAL LAPAROSCOPIC HYSTERECTOMY WITH BILATERAL SALPINGECTOMY;  Surgeon: Teresa Fender, MD;  Location: [facility];  Service: Gynecology;  Laterality: N/A;   WISDOM TOOTH EXTRACTION      Family History  Problem Relation Age of Onset   Depression Mother    Anxiety disorder Mother    Heart disease Mother    Bipolar disorder Father    Hypertension Father    Diabetes Father    Depression Brother    Anxiety disorder Brother    Colon cancer Maternal Grandfather     Social History   Socioeconomic History   Marital status: Legally Separated    Spouse name: Tarhonda Hollenberg   Number of children: 0   Years of education: 14   Highest education level: Associate degree: academic program  Occupational History   Occupation: Public house manager    Comment: [employer]  Tobacco Use   Smoking status: Former    Types: Cigarettes   Smokeless tobacco: Never   Tobacco comments:    Vape daily - currently 01/30/2023, no cigarettes  Vaping Use   Vaping status: Every Day   Substances: Nicotine, Flavoring  Substance and Sexual Activity   Alcohol use: Yes    Alcohol/week: 7.0 standard drinks of alcohol    Types: 5 Cans of beer, 2 Shots of liquor per week    Comment: occ   Drug use: Yes    Types: Marijuana   Sexual activity: Yes  Partners: Male    Birth control/protection: Surgical    Comment: Hysterectomy  Other Topics Concern   Not on file  Social History Narrative   Live with husband and her brother, has 2 dogs and a bunny at home. Has stepdaughter that does not live with them.   Social Drivers of Corporate investment banker Strain: Low Risk  (12/02/2018)   Overall Financial Resource Strain (CARDIA)    Difficulty of Paying Living Expenses: Not hard at all  Food Insecurity: No  Food Insecurity (12/02/2018)   Hunger Vital Sign    Worried About Running Out of Food in the Last Year: Never true    Ran Out of Food in the Last Year: Never true  Transportation Needs: No Transportation Needs (12/02/2018)   PRAPARE - Administrator, Civil Service (Medical): No    Lack of Transportation (Non-Medical): No  Physical Activity: Inactive (12/02/2018)   Exercise Vital Sign    Days of Exercise per Week: 0 days    Minutes of Exercise per Session: 0 min  Stress: Stress Concern Present (12/02/2018)   [employer] of Occupational Health - Occupational Stress Questionnaire    Feeling of Stress : Rather much  Social Connections: Moderately Integrated (12/02/2018)   Social Connection and Isolation Panel    Frequency of Communication with Friends and Family: More than three times a week    Frequency of Social Gatherings with Friends and Family: More than three times a week    Attends Religious Services: More than 4 times per year    Active Member of [employer] or Organizations: No    Attends Banker Meetings: Never    Marital Status: Married  Catering manager Violence: Not At Risk (12/02/2018)   Humiliation, Afraid, Rape, and Kick questionnaire    Fear of Current or Ex-Partner: No    Emotionally Abused: No    Physically Abused: No    Sexually Abused: No    Outpatient Medications Prior to Visit  Medication Sig Dispense Refill   omeprazole (PRILOSEC) 40 MG capsule Take 40 mg by mouth daily.     brompheniramine-pseudoephedrine-DM 30-2-10 MG/5ML syrup Take 5 mLs by mouth 4 (four) times daily as needed. 120 mL 0   cetirizine  (ZYRTEC  ALLERGY) 10 MG tablet Take 1 tablet (10 mg total) by mouth daily. 30 tablet 0   ondansetron  (ZOFRAN -ODT) 4 MG disintegrating tablet Take 1 tablet (4 mg total) by mouth every 8 (eight) hours as needed for nausea or vomiting. 20 tablet 0   No facility-administered medications prior to visit.      ROS:  Review of Systems   Constitutional:  Negative for fever.  Gastrointestinal:  Negative for blood in stool, constipation, diarrhea, nausea and vomiting.  Genitourinary:  Negative for dyspareunia, dysuria, flank pain, frequency, hematuria, urgency, vaginal bleeding, vaginal discharge and vaginal pain.  Musculoskeletal:  Negative for back pain.  Skin:  Negative for rash.   BREAST: mass   OBJECTIVE:   Vitals:  BP 139/87   Pulse (!) 101   Ht 5' 2 (1.575 m)   Wt 264 lb (119.7 kg)   LMP 04/12/2023   BMI 48.29 kg/m   Physical Exam Vitals reviewed.  Pulmonary:     Effort: Pulmonary effort is normal.  Chest:  Breasts:    Breasts are symmetrical.     Right: Mass present. No inverted nipple, nipple discharge, skin change or tenderness.     Left: No inverted nipple, mass, nipple discharge, skin change or  tenderness.    Musculoskeletal:        General: Normal range of motion.     Cervical back: Normal range of motion.   Skin:    General: Skin is warm and dry.   Neurological:     General: No focal deficit present.     Mental Status: She is alert and oriented to person, place, and time.     Cranial Nerves: No cranial nerve deficit.   Psychiatric:        Mood and Affect: Mood normal.        Behavior: Behavior normal.        Thought Content: Thought content normal.        Judgment: Judgment normal.     Assessment/Plan: Mass of upper inner quadrant of right breast - Plan: US  LIMITED ULTRASOUND INCLUDING AXILLA RIGHT BREAST, US  LIMITED ULTRASOUND INCLUDING AXILLA LEFT BREAST , MM 3D DIAGNOSTIC MAMMOGRAM BILATERAL BREAST; Rt breast 2:00 pos. Check dx mammo and u/s, will f/u with results.     Return if symptoms worsen or fail to improve.  Kemper Heupel B. Velita Quirk, PA-C 02/21/2024 3:56 PM

## 2024-02-21 NOTE — Patient Instructions (Signed)
 I value your feedback and you entrusting [place] with your care. If you get a [place] patient survey, I would appreciate you taking the time to let us know about your experience today. Thank you! ? ? ?

## 2024-02-26 ENCOUNTER — Ambulatory Visit
Admission: RE | Admit: 2024-02-26 | Discharge: 2024-02-26 | Disposition: A | Discharge status: Home / Self Care | Source: Ambulatory Visit | Attending: Obstetrics and Gynecology | Admitting: Obstetrics and Gynecology

## 2024-02-26 DIAGNOSIS — N6312 Unspecified lump in the right breast, upper inner quadrant: Secondary | ICD-10-CM

## 2024-02-27 ENCOUNTER — Ambulatory Visit: Payer: Self-pay | Admitting: Obstetrics and Gynecology

## 2024-06-11 ENCOUNTER — Telehealth: Payer: Self-pay

## 2024-06-11 NOTE — Telephone Encounter (Signed)
 Called patient to see if she had diabetic eye exam this year and if so what the results were. No answer and no VM set up.   JM

## 2024-06-13 ENCOUNTER — Encounter: Payer: Self-pay | Admitting: Family Medicine

## 2024-06-13 ENCOUNTER — Ambulatory Visit (INDEPENDENT_AMBULATORY_CARE_PROVIDER_SITE_OTHER): Admitting: Family Medicine

## 2024-06-13 ENCOUNTER — Ambulatory Visit: Payer: Self-pay

## 2024-06-13 VITALS — BP 140/96 | HR 100 | Ht 62.0 in | Wt 272.0 lb

## 2024-06-13 DIAGNOSIS — I10 Essential (primary) hypertension: Secondary | ICD-10-CM

## 2024-06-13 DIAGNOSIS — L239 Allergic contact dermatitis, unspecified cause: Secondary | ICD-10-CM | POA: Insufficient documentation

## 2024-06-13 MED ORDER — EPINEPHRINE 0.3 MG/0.3ML IJ SOAJ
0.3000 mg | INTRAMUSCULAR | 0 refills | Status: AC | PRN
Start: 2024-06-13 — End: ?

## 2024-06-13 MED ORDER — METHYLPREDNISOLONE 4 MG PO TBPK
ORAL_TABLET | ORAL | 0 refills | Status: DC
Start: 2024-06-13 — End: 2024-07-14

## 2024-06-13 NOTE — Progress Notes (Signed)
 Primary Care / Sports Medicine Office Visit  Patient Information:  Patient ID: Amanda Nash, female DOB: February 16, 1986 Age: 38 y.o. MRN: [id number]   Amanda Nash is a pleasant 38 y.o. female presenting with the following:  Chief Complaint  Patient presents with   Rash    Rash on face since this morning. Rash is red and itchy. Patient took a benadryl and it has calmed down some. Patient ha not changed soaps, detergents, and has not ate anything new.    Vitals:   06/13/24 1633 06/13/24 1650  BP: (!) 140/100 (!) 140/96  Pulse: 100   SpO2: 98%    Vitals:   06/13/24 1633  Weight: 272 lb (123.4 kg)  Height: 5' 2 (1.575 m)   Body mass index is 49.75 kg/m.  No results found.   Discussed the use of AI scribe software for clinical note transcription with the patient, who gave verbal consent to proceed.   Independent interpretation of notes and tests performed by another provider:   None  Procedures performed:   None  Pertinent History, Exam, Impression, and Recommendations:   Problem List Items Addressed This Visit     Allergic dermatitis - Primary   History of Present Illness Amanda Nash is a 38 year old female who presents with an allergic reaction characterized by facial rash and swelling.  Acute allergic reaction - Onset of symptoms at 4 AM with redness around the left eye - Facial flushing after showering, initially attributed to hot water - Progression of rash from chin to top of chest by arrival at work - Facial swelling present - No symptom relief after taking two Claritin at 7 AM - Took one 25 mg Benadryl at 9:45 AM - Sent home from work around noon due to visible allergic reaction - Rash began to clear after one hour of rest, but remained present  Potential allergen exposure - No recent changes in topical exposures such as makeup, lotions, or detergents; has used same products for an extended period - Dog sleeps in her bed and had been on her  fianc's pillow prior to her lying down, considered as a possible source of exposure  Associated and constitutional symptoms - No recent upper respiratory infections - No gastrointestinal symptoms such as nausea or vomiting  Atopic and allergic history - History of severe seasonal allergies - Past shellfish allergy with previous respiratory symptoms, which she has outgrown - History of chronic bronchitis as a child  Physical Exam HEENT: Oropharynx normal. NECK: Neck supple, no tenderness. CHEST: Lungs clear to auscultation bilaterally. SKIN: faint erythematous maculopapular rash diffusely - improved from photo patient provided  Assessment and Plan Acute allergic reaction with cutaneous symptoms Acute allergic reaction with cutaneous symptoms, likely related to exposure to the dog or something the dog may have come into contact with. Discussed potential for symptom worsening upon re-exposure and need for monitoring new symptoms indicating anaphylaxis. - Dose 50 mg Benadryl every 4-6 hours as needed. - Prescribed Medrol  DosePak if symptoms worsen or new symptoms develop. - Prescribed EpiPen for emergency use in case of anaphylaxis. - Advised washing sheets in high heat and bathing the dog. - Instructed to avoid re-exposure to potential allergens, including keeping the dog out of the bed. - Advised to monitor symptoms and take pictures to track progress. - Instructed to go to the ER if respiratory symptoms develop or if prednisone is needed.      Relevant Medications   EPINEPHrine (  EPIPEN 2-PAK) 0.3 mg/0.3 mL IJ SOAJ injection   methylPREDNISolone  (MEDROL  DOSEPAK) 4 MG TBPK tablet   Primary hypertension (Chronic)   Essential hypertension Essential hypertension, currently self-discontinued medications. Blood pressure elevated. - Schedule a physical exam before the end of the year.      Relevant Medications   EPINEPHrine (EPIPEN 2-PAK) 0.3 mg/0.3 mL IJ SOAJ injection     Orders  & Medications Medications:  Meds ordered this encounter  Medications   EPINEPHrine (EPIPEN 2-PAK) 0.3 mg/0.3 mL IJ SOAJ injection    Sig: Inject 0.3 mg into the muscle as needed for anaphylaxis.    Dispense:  1 each    Refill:  0   methylPREDNISolone  (MEDROL  DOSEPAK) 4 MG TBPK tablet    Sig: Use as directed.    Dispense:  21 each    Refill:  0   No orders of the defined types were placed in this encounter.    No follow-ups on file.     Selinda [name], MD, [facility]   Primary Care Sports Medicine Primary Care and Sports Medicine at [facility]

## 2024-06-13 NOTE — Telephone Encounter (Signed)
 Noted  Pt has appt.  KP

## 2024-06-13 NOTE — Assessment & Plan Note (Signed)
 Essential hypertension Essential hypertension, currently self-discontinued medications. Blood pressure elevated. - Schedule a physical exam before the end of the year.

## 2024-06-13 NOTE — Telephone Encounter (Signed)
 FYI Only or Action Required?: FYI only for provider.  Patient was last seen in primary care on 01/29/2024 by Moishe Chiquita HERO, NP.  Called Nurse Triage reporting Rash.  Symptoms began today.  Interventions attempted: OTC medications: benadryl.  Symptoms are: gradually worsening.  Triage Disposition: See PCP When Office is Open (Within 3 Days)  Patient/caregiver understands and will follow disposition?: Yes     Copied from CRM #[id number]. Topic: Clinical - Red Word Triage >> Jun 13, 2024 10:30 AM Willma R wrote: [facility] that prompted transfer to Nurse Triage: Patient is having an allergic reaction, rash all over face, burns when puts pressure, is spreading and not getting any better. Has taken 2 Claritin @7  and a Benadryl @9 :45. Hasn't eaten or taking anything that would cause and has no known allergies.     Reason for Disposition  [1] Severe localized itching AND [2] after 2 days of steroid cream    Itchiness with Benadryl  Answer Assessment - Initial Assessment Questions 1. APPEARANCE of RASH: What does the rash look like? (e.g., blisters, dry flaky skin, red spots, redness, sores)     Red splotchy, now with some hives 2. LOCATION: Where is the rash located?      Face and forehead  3. NUMBER: How many spots are there?      Multiple across face 4. SIZE: How big are the spots? (e.g., inches, cm; or compare to size of pinhead, tip of pen, eraser, pea)      They look like freckles 5. ONSET: When did the rash start?      Today  6. ITCHING: Does the rash itch? If Yes, ask: How bad is the itch?  (Scale 0-10; or none, mild, moderate, severe)     Mild 7. PAIN: Does the rash hurt? If Yes, ask: How bad is the pain?  (Scale 0-10; or none, mild, moderate, severe)     3/10 burning sensation when touched  8. OTHER SYMPTOMS: Do you have any other symptoms? (e.g., fever)     Possible mild swelling  Protocols used: Rash or Redness - Localized-A-AH

## 2024-06-13 NOTE — Assessment & Plan Note (Signed)
 History of Present Illness Amanda Nash is a 38 year old female who presents with an allergic reaction characterized by facial rash and swelling.  Acute allergic reaction - Onset of symptoms at 4 AM with redness around the left eye - Facial flushing after showering, initially attributed to hot water - Progression of rash from chin to top of chest by arrival at work - Facial swelling present - No symptom relief after taking two Claritin at 7 AM - Took one 25 mg Benadryl at 9:45 AM - Sent home from work around noon due to visible allergic reaction - Rash began to clear after one hour of rest, but remained present  Potential allergen exposure - No recent changes in topical exposures such as makeup, lotions, or detergents; has used same products for an extended period - Dog sleeps in her bed and had been on her fianc's pillow prior to her lying down, considered as a possible source of exposure  Associated and constitutional symptoms - No recent upper respiratory infections - No gastrointestinal symptoms such as nausea or vomiting  Atopic and allergic history - History of severe seasonal allergies - Past shellfish allergy with previous respiratory symptoms, which she has outgrown - History of chronic bronchitis as a child  Physical Exam HEENT: Oropharynx normal. NECK: Neck supple, no tenderness. CHEST: Lungs clear to auscultation bilaterally. SKIN: faint erythematous maculopapular rash diffusely - improved from photo patient provided  Assessment and Plan Acute allergic reaction with cutaneous symptoms Acute allergic reaction with cutaneous symptoms, likely related to exposure to the dog or something the dog may have come into contact with. Discussed potential for symptom worsening upon re-exposure and need for monitoring new symptoms indicating anaphylaxis. - Dose 50 mg Benadryl every 4-6 hours as needed. - Prescribed Medrol  DosePak if symptoms worsen or new symptoms develop. -  Prescribed EpiPen for emergency use in case of anaphylaxis. - Advised washing sheets in high heat and bathing the dog. - Instructed to avoid re-exposure to potential allergens, including keeping the dog out of the bed. - Advised to monitor symptoms and take pictures to track progress. - Instructed to go to the ER if respiratory symptoms develop or if prednisone is needed.

## 2024-06-13 NOTE — Patient Instructions (Addendum)
 VISIT SUMMARY:  Today, you were seen for an allergic reaction with a facial rash and swelling. We discussed potential causes, treatment options, and preventive measures. We also noted your slightly elevated blood pressure, likely due to the allergic reaction.  YOUR PLAN:  ACUTE ALLERGIC REACTION WITH CUTANEOUS SYMPTOMS: You experienced an allergic reaction, likely related to exposure to your dog or something the dog may have come into contact with. -Take 50 mg Benadryl every 4-6 hours as needed until rash resolves. -Use the Medrol  DosePak if symptoms worsen or new symptoms develop. -Pick up EpiPen for emergency use in case of anaphylaxis. -Wash your sheets in high heat and bathe your dog. -Avoid re-exposure to potential allergens, including keeping the dog out of the bed. -Monitor your symptoms and take pictures to track progress. -Go to the ER if you develop respiratory symptoms or if symptoms do not improve with prednisone.  ESSENTIAL HYPERTENSION: Your blood pressure was slightly elevated. -Schedule a physical exam before the end of the year.

## 2024-07-14 ENCOUNTER — Encounter: Payer: Self-pay | Admitting: Family Medicine

## 2024-07-14 ENCOUNTER — Ambulatory Visit (INDEPENDENT_AMBULATORY_CARE_PROVIDER_SITE_OTHER): Admitting: Family Medicine

## 2024-07-14 VITALS — BP 160/100 | HR 73 | Temp 98.3°F | Ht 62.0 in | Wt 273.8 lb

## 2024-07-14 DIAGNOSIS — G4733 Obstructive sleep apnea (adult) (pediatric): Secondary | ICD-10-CM

## 2024-07-14 DIAGNOSIS — Z0000 Encounter for general adult medical examination without abnormal findings: Secondary | ICD-10-CM

## 2024-07-14 DIAGNOSIS — M7542 Impingement syndrome of left shoulder: Secondary | ICD-10-CM

## 2024-07-14 DIAGNOSIS — G8929 Other chronic pain: Secondary | ICD-10-CM

## 2024-07-14 DIAGNOSIS — F603 Borderline personality disorder: Secondary | ICD-10-CM

## 2024-07-14 DIAGNOSIS — E1159 Type 2 diabetes mellitus with other circulatory complications: Secondary | ICD-10-CM

## 2024-07-14 DIAGNOSIS — Z7985 Long-term (current) use of injectable non-insulin antidiabetic drugs: Secondary | ICD-10-CM | POA: Insufficient documentation

## 2024-07-14 DIAGNOSIS — D500 Iron deficiency anemia secondary to blood loss (chronic): Secondary | ICD-10-CM

## 2024-07-14 DIAGNOSIS — L239 Allergic contact dermatitis, unspecified cause: Secondary | ICD-10-CM

## 2024-07-14 DIAGNOSIS — M545 Low back pain, unspecified: Secondary | ICD-10-CM

## 2024-07-14 DIAGNOSIS — I10 Essential (primary) hypertension: Secondary | ICD-10-CM

## 2024-07-14 MED ORDER — LISINOPRIL 10 MG PO TABS
10.0000 mg | ORAL_TABLET | Freq: Every day | ORAL | 3 refills | Status: AC
Start: 2024-07-14 — End: ?

## 2024-07-14 MED ORDER — OZEMPIC (0.25 OR 0.5 MG/DOSE) 2 MG/1.5ML ~~LOC~~ SOPN: 0.2500 mg | PEN_INJECTOR | SUBCUTANEOUS | 2 refills | Status: AC

## 2024-07-14 MED ORDER — OZEMPIC (0.25 OR 0.5 MG/DOSE) 2 MG/1.5ML ~~LOC~~ SOPN: 0.2500 mg | PEN_INJECTOR | SUBCUTANEOUS | 2 refills | Status: DC

## 2024-07-14 MED ORDER — LISINOPRIL 10 MG PO TABS: 10.0000 mg | ORAL_TABLET | Freq: Every day | ORAL | 3 refills | Status: DC

## 2024-07-14 MED ORDER — ONDANSETRON 4 MG PO TBDP: 4.0000 mg | ORAL_TABLET | Freq: Three times a day (TID) | ORAL | 0 refills | Status: AC | PRN

## 2024-07-14 NOTE — Progress Notes (Signed)
 Annual Physical Exam Visit  Patient Information:  Patient ID: Amanda Nash, female DOB: [birth year] Age: 38 y.o. MRN: [id number]   Subjective:   CC: Annual Physical Exam  HPI:  Amanda Nash is here for their annual physical.  I reviewed the past medical history, family history, social history, surgical history, and allergies today and changes were made as necessary.  Please see the problem list section below for additional details.  Past Medical History: Past Medical History:  Diagnosis Date   Adenomyosis 07/24/2023   ADHD    Anemia    Anxiety    Bipolar 1 disorder (HCC) 2016   Borderline personality disorder (HCC)    Depression    Diabetes mellitus without complication (HCC)    Epistaxis, recurrent 02/13/2022   GERD (gastroesophageal reflux disease)    Headache    HLD (hyperlipidemia)    Hypertension    Lumbar degenerative disc disease    Segmental and somatic dysfunction of cervical region    Sleep apnea    Thrombocytosis    Past Surgical History: Past Surgical History:  Procedure Laterality Date   DILATATION & CURRETTAGE/HYSTEROSCOPY WITH RESECTOCOPE  2011   IUD REMOVAL N/A 06/11/2023   Procedure: INTRAUTERINE DEVICE (IUD) REMOVAL;  Surgeon: Connell Davies, MD;  Location: [facility];  Service: Gynecology;  Laterality: N/A;   ROBOTIC ASSISTED TOTAL HYSTERECTOMY WITH BILATERAL SALPINGO OOPHERECTOMY N/A 06/11/2023   Procedure: ROBOTIC TOTAL LAPAROSCOPIC HYSTERECTOMY WITH BILATERAL SALPINGECTOMY;  Surgeon: Connell Davies, MD;  Location: [facility];  Service: Gynecology;  Laterality: N/A;   WISDOM TOOTH EXTRACTION     Family History: Family History  Problem Relation Age of Onset   Depression Mother    Anxiety disorder Mother    Heart disease Mother    Bipolar disorder Father    Hypertension Father    Diabetes Father    Colon cancer Maternal Grandfather    Depression Brother    Anxiety disorder Brother    Breast cancer Neg Hx    Allergies: No Known  Allergies Health Maintenance: Health Maintenance  Topic Date Due   Diabetic kidney evaluation - Urine ACR  Never done   HEMOGLOBIN A1C  12/04/2023   FOOT EXAM  01/30/2024   Diabetic kidney evaluation - eGFR measurement  06/05/2024   OPHTHALMOLOGY EXAM  07/14/2024 (Originally 05/31/1995)   COVID-19 Vaccine (3 - 2025-26 season) 07/30/2024 (Originally 05/12/2024)   Pneumococcal Vaccine (1 of 2 - PCV) 07/14/2025 (Originally 05/30/2004)   Hepatitis B Vaccines 19-59 Average Risk (1 of 3 - 19+ 3-dose series) 07/14/2025 (Originally 05/30/2004)   HPV VACCINES (1 - 3-dose SCDM series) 07/14/2025 (Originally 05/30/2012)   DTaP/Tdap/Td (2 - Td or Tdap) 12/01/2028   Influenza Vaccine  Completed   Hepatitis C Screening  Completed   HIV Screening  Completed   Meningococcal B Vaccine  Aged Out    HM Colonoscopy   This patient has no relevant Health Maintenance data.    Medications: Current Outpatient Medications on File Prior to Visit  Medication Sig Dispense Refill   cetirizine  (ZYRTEC  ALLERGY) 10 MG tablet Take 1 tablet (10 mg total) by mouth daily. 30 tablet 0   EPINEPHrine (EPIPEN 2-PAK) 0.3 mg/0.3 mL IJ SOAJ injection Inject 0.3 mg into the muscle as needed for anaphylaxis. 1 each 0   No current facility-administered medications on file prior to visit.    Discussed the use of AI scribe software for clinical note transcription with the patient, who gave verbal consent to proceed.  Objective:   Vitals:   07/14/24 0834 07/14/24 0917  BP: (!) 162/100 (!) 160/100  Pulse: 73   Temp: 98.3 F (36.8 C)   SpO2: 97%    Vitals:   07/14/24 0834  Weight: 273 lb 12.8 oz (124.2 kg)  Height: 5' 2 (1.575 m)   Body mass index is 50.08 kg/m.  General: Well Developed, well nourished, and in no acute distress.  Neuro: Alert and oriented x3, extra-ocular muscles intact, sensation grossly intact. Cranial nerves II through XII are grossly intact, motor, sensory, and coordinative functions are  intact. HEENT: Normocephalic, atraumatic, neck supple, no masses, no lymphadenopathy, thyroid nonenlarged. Oropharynx, nasopharynx, external ear canals are unremarkable. Skin: Warm and dry, no rashes noted.  Cardiac: Regular rate and rhythm, no murmurs rubs or gallops. No peripheral edema. Pulses symmetric. Respiratory: Clear to auscultation bilaterally. Speaking in full sentences.  Abdominal: Soft, nontender, nondistended, positive bowel sounds, no masses, no organomegaly. Musculoskeletal: Stable, and with full range of motion.  Impression and Recommendations:   The patient was counselled, risk factors were discussed, and anticipatory guidance given.  Problem List Items Addressed This Visit     Allergic dermatitis   Allergic reaction - History of facial rash and swelling, suspected allergic reaction possibly related to dog exposure - Symptoms resolved overnight with increased doses of Benadryl - No further reactions; no use of EpiPen or steroids      Anemia   Anemia Anemia requires monitoring. - Order repeat blood count.      Relevant Orders   Iron, TIBC and Ferritin Panel   Borderline personality disorder (HCC)   Doing very well since relationship change.      Chronic bilateral low back pain without sciatica   Musculoskeletal pain - Chronic lower back pain with localized arthritis, managed with massages from her partner      Diabetes mellitus (HCC)   Diabetes mellitus management - Diabetes mellitus with prior use of GLP-1 agonist (Mounjaro ) for glycemic control - Discontinued Mounjaro  due to significant nausea and after hysterectomy - Experienced weight loss while on Mounjaro  - Considering initiation of Ozempic , with concern for potential nausea as a side effect  Type 2 diabetes mellitus Diabetes well-managed. Previous Mounjaro  use caused nausea. Discussed Ozempic  with gradual dose escalation to minimize side effects. Emphasized bowel regimen for GI side effects.  Highlighted weight loss benefits for joint pain, blood pressure, cholesterol, and mental health. - Prescribe Ozempic  starting at 0.25 mg with gradual dose escalation as tolerated. - Send Zofran  prescription for nausea management. - Advise on bowel regimen with Metamucil and Miralax. - Order repeat A1c in three months.      Relevant Medications   lisinopril (ZESTRIL) 10 MG tablet   Semaglutide ,0.25 or 0.5MG /DOS, (OZEMPIC , 0.25 OR 0.5 MG/DOSE,) 2 MG/1.5ML SOPN   Other Relevant Orders   Comprehensive metabolic panel with GFR   Hemoglobin A1c   Lipid panel   Urine Albumin/Creatinine with ratio (send out) [LAB689]   TSH   Healthcare maintenance - Primary   Annual examination completed, risk stratification labs ordered, anticipatory guidance provided.  We will follow labs once resulted.      Relevant Orders   CBC   Comprehensive metabolic panel with GFR   Hemoglobin A1c   Lipid panel   Urine Albumin/Creatinine with ratio (send out) [LAB689]   Long-term (current) use of injectable non-insulin  antidiabetic drugs   Relevant Medications   Semaglutide ,0.25 or 0.5MG /DOS, (OZEMPIC , 0.25 OR 0.5 MG/DOSE,) 2 MG/1.5ML SOPN   OSA (obstructive sleep apnea)  Obstructive sleep apnea Obstructive sleep apnea not managed with CPAP post-hysterectomy. Discussed weight loss potential to improve symptoms. Encouraged continued CPAP use until weight loss goals achieved. - Encourage use of CPAP until weight loss goals are achieved, when repeat sleep study can be considered.      Primary hypertension (Chronic)   Hypertension - Elevated blood pressure noted at last visit  Essential hypertension Blood pressure elevated. Discussed lisinopril for blood pressure control and kidney protection. Potential discontinuation if weight loss and diabetes management improve blood pressure. - Prescribe lisinopril, 10 mg dose, to be taken once daily.      Relevant Medications   lisinopril (ZESTRIL) 10 MG tablet    Other Relevant Orders   Comprehensive metabolic panel with GFR   Lipid panel   TSH   Rotator cuff impingement syndrome of left shoulder   Musculoskeletal pain - Occasional left shoulder discomfort, alleviated with massage therapy        Orders & Medications Medications:  Meds ordered this encounter  Medications   DISCONTD: lisinopril (ZESTRIL) 10 MG tablet    Sig: Take 1 tablet (10 mg total) by mouth daily.    Dispense:  90 tablet    Refill:  3   DISCONTD: Semaglutide ,0.25 or 0.5MG /DOS, (OZEMPIC , 0.25 OR 0.5 MG/DOSE,) 2 MG/1.5ML SOPN    Sig: Inject 0.25 mg into the skin once a week.    Dispense:  1.5 mL    Refill:  2    Use discount coupon. Superior A1c reduction. Dx Metabolic syndrome (rep Chauncey [phone])   lisinopril (ZESTRIL) 10 MG tablet    Sig: Take 1 tablet (10 mg total) by mouth daily.    Dispense:  90 tablet    Refill:  3   Semaglutide ,0.25 or 0.5MG /DOS, (OZEMPIC , 0.25 OR 0.5 MG/DOSE,) 2 MG/1.5ML SOPN    Sig: Inject 0.25 mg into the skin once a week.    Dispense:  1.5 mL    Refill:  2   ondansetron  (ZOFRAN -ODT) 4 MG disintegrating tablet    Sig: Take 1 tablet (4 mg total) by mouth every 8 (eight) hours as needed for up to 10 days for nausea or vomiting.    Dispense:  20 tablet    Refill:  0   Orders Placed This Encounter  Procedures   CBC   Comprehensive metabolic panel with GFR   Hemoglobin A1c   Lipid panel   Urine Albumin/Creatinine with ratio (send out) [LAB689]   Iron, TIBC and Ferritin Panel   TSH     No follow-ups on file.    Selinda [name], MD, [facility]   Primary Care Sports Medicine [facility] at [facility]

## 2024-07-14 NOTE — Assessment & Plan Note (Signed)
 Annual examination completed, risk stratification labs ordered, anticipatory guidance provided.  We will follow labs once resulted.

## 2024-07-14 NOTE — Assessment & Plan Note (Signed)
 Musculoskeletal pain - Chronic lower back pain with localized arthritis, managed with massages from her partner

## 2024-07-14 NOTE — Assessment & Plan Note (Signed)
 Hypertension - Elevated blood pressure noted at last visit  Essential hypertension Blood pressure elevated. Discussed lisinopril for blood pressure control and kidney protection. Potential discontinuation if weight loss and diabetes management improve blood pressure. - Prescribe lisinopril, 10 mg dose, to be taken once daily.

## 2024-07-14 NOTE — Assessment & Plan Note (Signed)
 Anemia Anemia requires monitoring. - Order repeat blood count.

## 2024-07-14 NOTE — Assessment & Plan Note (Signed)
 Musculoskeletal pain - Occasional left shoulder discomfort, alleviated with massage therapy

## 2024-07-14 NOTE — Assessment & Plan Note (Signed)
 Allergic reaction - History of facial rash and swelling, suspected allergic reaction possibly related to dog exposure - Symptoms resolved overnight with increased doses of Benadryl - No further reactions; no use of EpiPen or steroids

## 2024-07-14 NOTE — Patient Instructions (Addendum)
-   Obtain fasting labs with orders provided (can have water or black coffee but otherwise no food or drink x 8 hours before labs) - Review information provided - Attend eye doctor annually, dentist every 6 months, work towards or maintain 30 minutes of moderate intensity physical activity at least 5 days per week, and consume a balanced diet - Return in 1 year for physical - Contact us  for any questions between now and then   VISIT SUMMARY:  Today, we reviewed your diabetes management, blood pressure, sleep apnea, and other health concerns. We discussed starting a new medication for diabetes and addressed your blood pressure and sleep apnea management. We also talked about your chronic back pain and general health maintenance.  YOUR PLAN:  TYPE 2 DIABETES MELLITUS: Your diabetes is well-managed, but we need to address the nausea you experienced with your previous medication and consider a new treatment option. -Start Ozempic  at 0.25 mg with gradual dose increases as tolerated. -Take Zofran  as needed for nausea. -Follow a bowel regimen with Metamucil and Miralax to manage gastrointestinal side effects. -Repeat A1c test in three months.  HYPERTENSION: Your blood pressure is elevated, we need to manage it to protect your kidneys and overall health. -Start taking lisinopril at the 10 mg dose once daily.  OBSTRUCTIVE SLEEP APNEA: Your sleep apnea has not been managed with CPAP since your hysterectomy.  -Continue using your CPAP machine.  CHRONIC LOW BACK PAIN WITH LUMBAR ARTHRITIS: Your chronic low back pain is being managed well with massages. -Continue current management with massage therapy.  ANEMIA: Your anemia needs to be monitored. -Repeat blood count test.  GENERAL HEALTH MAINTENANCE: Routine health maintenance is up to date, but an annual eye exam is important, especially with your diabetes history. -Schedule your annual eye exam.

## 2024-07-14 NOTE — Assessment & Plan Note (Addendum)
 Diabetes mellitus management - Diabetes mellitus with prior use of GLP-1 agonist (Mounjaro ) for glycemic control - Discontinued Mounjaro  due to significant nausea and after hysterectomy - Experienced weight loss while on Mounjaro  - Considering initiation of Ozempic , with concern for potential nausea as a side effect  Type 2 diabetes mellitus Diabetes well-managed. Previous Mounjaro  use caused nausea. Discussed Ozempic  with gradual dose escalation to minimize side effects. Emphasized bowel regimen for GI side effects. Highlighted weight loss benefits for joint pain, blood pressure, cholesterol, and mental health. - Prescribe Ozempic  starting at 0.25 mg with gradual dose escalation as tolerated. - Send Zofran  prescription for nausea management. - Advise on bowel regimen with Metamucil and Miralax. - Order repeat A1c in three months.

## 2024-07-14 NOTE — Assessment & Plan Note (Signed)
 Doing very well since relationship change.

## 2024-07-14 NOTE — Assessment & Plan Note (Signed)
 Obstructive sleep apnea Obstructive sleep apnea not managed with CPAP post-hysterectomy. Discussed weight loss potential to improve symptoms. Encouraged continued CPAP use until weight loss goals achieved. - Encourage use of CPAP until weight loss goals are achieved, when repeat sleep study can be considered.

## 2024-07-15 LAB — HEMOGLOBIN A1C
Est. average glucose Bld gHb Est-mCnc: 140 mg/dL
Hgb A1c MFr Bld: 6.5 % — ABNORMAL HIGH (ref 4.8–5.6)

## 2024-07-15 LAB — COMPREHENSIVE METABOLIC PANEL WITH GFR
ALT: 59 IU/L — ABNORMAL HIGH (ref 0–32)
AST: 23 IU/L (ref 0–40)
Albumin: 4.2 g/dL (ref 3.9–4.9)
Alkaline Phosphatase: 96 IU/L (ref 41–116)
BUN/Creatinine Ratio: 21 (ref 9–23)
BUN: 15 mg/dL (ref 6–20)
Bilirubin Total: 0.3 mg/dL (ref 0.0–1.2)
CO2: 25 mmol/L (ref 20–29)
Calcium: 9.2 mg/dL (ref 8.7–10.2)
Chloride: 101 mmol/L (ref 96–106)
Creatinine, Ser: 0.71 mg/dL (ref 0.57–1.00)
Globulin, Total: 2.7 g/dL (ref 1.5–4.5)
Glucose: 124 mg/dL — ABNORMAL HIGH (ref 70–99)
Potassium: 4.4 mmol/L (ref 3.5–5.2)
Sodium: 139 mmol/L (ref 134–144)
Total Protein: 6.9 g/dL (ref 6.0–8.5)
eGFR: 111 mL/min/1.73 (ref >59)

## 2024-07-15 LAB — IRON,TIBC AND FERRITIN PANEL
Ferritin: 46 ng/mL (ref 15–150)
Iron Saturation: 15 % (ref 15–55)
Iron: 50 ug/dL (ref 27–159)
Total Iron Binding Capacity: 333 ug/dL (ref 250–450)
UIBC: 283 ug/dL (ref 131–425)

## 2024-07-15 LAB — LIPID PANEL
Chol/HDL Ratio: 3.1 ratio (ref 0.0–4.4)
Cholesterol, Total: 197 mg/dL (ref 100–199)
HDL: 64 mg/dL (ref >39)
LDL Chol Calc (NIH): 108 mg/dL — ABNORMAL HIGH (ref 0–99)
Triglycerides: 141 mg/dL (ref 0–149)
VLDL Cholesterol Cal: 25 mg/dL (ref 5–40)

## 2024-07-15 LAB — CBC
Hematocrit: 45.9 % (ref 34.0–46.6)
Hemoglobin: 14.3 g/dL (ref 11.1–15.9)
MCH: 26.4 pg — ABNORMAL LOW (ref 26.6–33.0)
MCHC: 31.2 g/dL — ABNORMAL LOW (ref 31.5–35.7)
MCV: 85 fL (ref 79–97)
Platelets: 334 x10E3/uL (ref 150–450)
RBC: 5.42 x10E6/uL — ABNORMAL HIGH (ref 3.77–5.28)
RDW: 14.5 % (ref 11.7–15.4)
WBC: 7 x10E3/uL (ref 3.4–10.8)

## 2024-07-15 LAB — MICROALBUMIN / CREATININE URINE RATIO
Creatinine, Urine: 74.8 mg/dL
Microalb/Creat Ratio: 4 mg/g{creat} (ref 0–29)
Microalbumin, Urine: 3.3 ug/mL

## 2024-07-15 LAB — TSH: TSH: 0.916 u[IU]/mL (ref 0.450–4.500)

## 2024-07-16 ENCOUNTER — Ambulatory Visit: Payer: Self-pay | Admitting: Family Medicine

## 2024-07-21 NOTE — Telephone Encounter (Signed)
 Please review and advise.  JM

## 2024-07-22 ENCOUNTER — Other Ambulatory Visit: Payer: Self-pay | Admitting: Family Medicine

## 2024-07-22 DIAGNOSIS — G4733 Obstructive sleep apnea (adult) (pediatric): Secondary | ICD-10-CM

## 2024-07-24 NOTE — Telephone Encounter (Signed)
 FYI

## 2024-08-12 ENCOUNTER — Ambulatory Visit: Admitting: Sleep Medicine

## 2024-08-26 ENCOUNTER — Ambulatory Visit: Admitting: Sleep Medicine

## 2024-10-14 ENCOUNTER — Ambulatory Visit: Admitting: Family Medicine

## 2025-07-15 ENCOUNTER — Encounter: Admitting: Family Medicine
